# Patient Record
Sex: Female | Born: 1955 | Race: White | Hispanic: No | Marital: Single | Smoking: Never smoker
Health system: Southern US, Community
[De-identification: ages and names within clinical notes are randomized; demographics above are authoritative.]

## PROBLEM LIST (undated history)

## (undated) ENCOUNTER — Inpatient Hospital Stay: Admission: EM | Payer: Self-pay | Source: Home / Self Care

## (undated) DIAGNOSIS — T8859XA Other complications of anesthesia, initial encounter: Secondary | ICD-10-CM

## (undated) DIAGNOSIS — B977 Papillomavirus as the cause of diseases classified elsewhere: Secondary | ICD-10-CM

## (undated) DIAGNOSIS — E785 Hyperlipidemia, unspecified: Secondary | ICD-10-CM

## (undated) DIAGNOSIS — N871 Moderate cervical dysplasia: Secondary | ICD-10-CM

## (undated) DIAGNOSIS — Z87442 Personal history of urinary calculi: Secondary | ICD-10-CM

## (undated) DIAGNOSIS — F32A Depression, unspecified: Secondary | ICD-10-CM

## (undated) DIAGNOSIS — T4145XA Adverse effect of unspecified anesthetic, initial encounter: Secondary | ICD-10-CM

## (undated) DIAGNOSIS — R51 Headache: Secondary | ICD-10-CM

## (undated) DIAGNOSIS — F329 Major depressive disorder, single episode, unspecified: Secondary | ICD-10-CM

## (undated) DIAGNOSIS — I10 Essential (primary) hypertension: Secondary | ICD-10-CM

## (undated) DIAGNOSIS — A630 Anogenital (venereal) warts: Secondary | ICD-10-CM

## (undated) DIAGNOSIS — F419 Anxiety disorder, unspecified: Secondary | ICD-10-CM

## (undated) HISTORY — DX: Papillomavirus as the cause of diseases classified elsewhere: B97.7

## (undated) HISTORY — DX: Depression, unspecified: F32.A

## (undated) HISTORY — DX: Anogenital (venereal) warts: A63.0

## (undated) HISTORY — DX: Anxiety disorder, unspecified: F41.9

## (undated) HISTORY — DX: Essential (primary) hypertension: I10

## (undated) HISTORY — DX: Major depressive disorder, single episode, unspecified: F32.9

## (undated) HISTORY — PX: OTHER SURGICAL HISTORY: SHX169

## (undated) HISTORY — DX: Hyperlipidemia, unspecified: E78.5

## (undated) HISTORY — DX: Moderate cervical dysplasia: N87.1

---

## 2000-07-01 ENCOUNTER — Encounter: Admission: RE | Admit: 2000-07-01 | Discharge: 2000-09-29 | Payer: Self-pay | Admitting: Family Medicine

## 2001-01-11 ENCOUNTER — Encounter: Admission: RE | Admit: 2001-01-11 | Discharge: 2001-04-11 | Payer: Self-pay | Admitting: Family Medicine

## 2001-08-29 ENCOUNTER — Encounter: Admission: RE | Admit: 2001-08-29 | Discharge: 2001-11-27 | Payer: Self-pay | Admitting: Family Medicine

## 2001-11-29 ENCOUNTER — Other Ambulatory Visit: Admission: RE | Admit: 2001-11-29 | Discharge: 2001-11-29 | Payer: Self-pay | Admitting: Gynecology

## 2006-01-13 ENCOUNTER — Encounter: Admission: RE | Admit: 2006-01-13 | Discharge: 2006-01-13 | Payer: Self-pay | Admitting: Internal Medicine

## 2006-04-29 ENCOUNTER — Other Ambulatory Visit: Admission: RE | Admit: 2006-04-29 | Discharge: 2006-04-29 | Payer: Self-pay | Admitting: Gynecology

## 2007-03-21 ENCOUNTER — Other Ambulatory Visit (HOSPITAL_COMMUNITY): Admission: RE | Admit: 2007-03-21 | Discharge: 2007-06-19 | Payer: Self-pay | Admitting: Psychiatry

## 2007-03-22 ENCOUNTER — Ambulatory Visit: Payer: Self-pay | Admitting: Psychiatry

## 2007-05-04 ENCOUNTER — Other Ambulatory Visit: Admission: RE | Admit: 2007-05-04 | Discharge: 2007-05-04 | Payer: Self-pay | Admitting: Gynecology

## 2007-05-17 ENCOUNTER — Encounter: Admission: RE | Admit: 2007-05-17 | Discharge: 2007-08-15 | Payer: Self-pay | Admitting: Internal Medicine

## 2007-06-06 ENCOUNTER — Ambulatory Visit (HOSPITAL_BASED_OUTPATIENT_CLINIC_OR_DEPARTMENT_OTHER): Admission: RE | Admit: 2007-06-06 | Discharge: 2007-06-06 | Payer: Self-pay | Admitting: Gynecology

## 2007-09-26 ENCOUNTER — Other Ambulatory Visit: Admission: RE | Admit: 2007-09-26 | Discharge: 2007-09-26 | Payer: Self-pay | Admitting: Gynecology

## 2008-02-21 ENCOUNTER — Ambulatory Visit: Payer: Self-pay | Admitting: Gynecology

## 2008-02-22 ENCOUNTER — Ambulatory Visit: Payer: Self-pay | Admitting: Gynecology

## 2008-02-29 ENCOUNTER — Ambulatory Visit: Payer: Self-pay | Admitting: Gynecology

## 2008-04-03 ENCOUNTER — Encounter: Admission: RE | Admit: 2008-04-03 | Discharge: 2008-04-03 | Payer: Self-pay | Admitting: Internal Medicine

## 2008-04-30 HISTORY — PX: COLONOSCOPY: SHX174

## 2008-05-07 ENCOUNTER — Other Ambulatory Visit: Admission: RE | Admit: 2008-05-07 | Discharge: 2008-05-07 | Payer: Self-pay | Admitting: Gynecology

## 2008-05-07 ENCOUNTER — Encounter: Payer: Self-pay | Admitting: Gynecology

## 2008-05-07 ENCOUNTER — Ambulatory Visit: Payer: Self-pay | Admitting: Gynecology

## 2008-05-29 ENCOUNTER — Ambulatory Visit: Payer: Self-pay | Admitting: Gynecology

## 2008-06-18 ENCOUNTER — Encounter: Admission: RE | Admit: 2008-06-18 | Discharge: 2008-06-18 | Payer: Self-pay | Admitting: Internal Medicine

## 2008-09-19 ENCOUNTER — Encounter: Payer: Self-pay | Admitting: Gynecology

## 2008-09-19 ENCOUNTER — Other Ambulatory Visit: Admission: RE | Admit: 2008-09-19 | Discharge: 2008-09-19 | Payer: Self-pay | Admitting: Gynecology

## 2008-09-19 ENCOUNTER — Ambulatory Visit: Payer: Self-pay | Admitting: Gynecology

## 2009-05-30 ENCOUNTER — Ambulatory Visit: Payer: Self-pay | Admitting: Gynecology

## 2009-06-21 HISTORY — PX: CHOLECYSTECTOMY: SHX55

## 2010-02-03 ENCOUNTER — Encounter: Admission: RE | Admit: 2010-02-03 | Discharge: 2010-02-03 | Payer: Self-pay | Admitting: Internal Medicine

## 2010-05-21 ENCOUNTER — Encounter: Admission: RE | Admit: 2010-05-21 | Discharge: 2010-05-21 | Payer: Self-pay | Admitting: Internal Medicine

## 2010-05-26 ENCOUNTER — Encounter (INDEPENDENT_AMBULATORY_CARE_PROVIDER_SITE_OTHER): Payer: Self-pay | Admitting: General Surgery

## 2010-06-05 ENCOUNTER — Observation Stay (HOSPITAL_COMMUNITY)
Admission: RE | Admit: 2010-06-05 | Discharge: 2010-06-06 | Payer: Self-pay | Source: Home / Self Care | Attending: General Surgery | Admitting: General Surgery

## 2010-07-12 ENCOUNTER — Encounter: Payer: Self-pay | Admitting: Internal Medicine

## 2010-08-31 LAB — GLUCOSE, CAPILLARY: Glucose-Capillary: 111 mg/dL — ABNORMAL HIGH (ref 70–99)

## 2010-08-31 LAB — COMPREHENSIVE METABOLIC PANEL
ALT: 24 U/L (ref 0–35)
CO2: 31 mEq/L (ref 19–32)
Calcium: 9.3 mg/dL (ref 8.4–10.5)
Chloride: 100 mEq/L (ref 96–112)
Creatinine, Ser: 0.72 mg/dL (ref 0.4–1.2)
GFR calc non Af Amer: >60 mL/min (ref >60)
Glucose, Bld: 98 mg/dL (ref 70–99)
Total Bilirubin: 0.5 mg/dL (ref 0.3–1.2)

## 2010-08-31 LAB — URINALYSIS, ROUTINE W REFLEX MICROSCOPIC
Glucose, UA: NEGATIVE mg/dL
Ketones, ur: NEGATIVE mg/dL
Nitrite: NEGATIVE
Protein, ur: NEGATIVE mg/dL

## 2010-08-31 LAB — CBC
HCT: 41.8 % (ref 36.0–46.0)
Hemoglobin: 13.4 g/dL (ref 12.0–15.0)
MCH: 27.6 pg (ref 26.0–34.0)
MCHC: 32.1 g/dL (ref 30.0–36.0)

## 2010-08-31 LAB — DIFFERENTIAL
Basophils Absolute: 0.1 10*3/uL (ref 0.0–0.1)
Eosinophils Absolute: 0.1 10*3/uL (ref 0.0–0.7)
Lymphocytes Relative: 29 % (ref 12–46)
Lymphs Abs: 2.3 10*3/uL (ref 0.7–4.0)
Neutrophils Relative %: 62 % (ref 43–77)

## 2010-08-31 LAB — SURGICAL PCR SCREEN: Staphylococcus aureus: POSITIVE — AB

## 2010-08-31 LAB — URINE MICROSCOPIC-ADD ON

## 2010-09-22 NOTE — Discharge Summary (Signed)
  NAMETRAVIS, PURK               ACCOUNT NO.:  [id number]  MEDICAL RECORD NO.:  [id number]          PATIENT TYPE:  OBV  LOCATION:  5114                         FACILITY:  [facility]  PHYSICIAN:  Almond Lint, MD       DATE OF BIRTH:  [birth year]  DATE OF ADMISSION:  06/05/2010 DATE OF DISCHARGE:  06/06/2010                              DISCHARGE SUMMARY   PRINCIPAL DIAGNOSIS:  Symptomatic cholelithiasis.  SECONDARY DIAGNOSES:  Diabetes, hypertension, hyperlipidemia, depression, and insomnia.  PRINCIPAL PROCEDURE:  Laparoscopic cholecystectomy June 05, 2010.  PATHOLOGY:  Chronic cholecystitis and cholelithiasis.  DISCHARGE MEDICATIONS: 1. Over-the-counter acid reducer 20 mg once a day. 2. Celexa 10 mg once a day. 3. Wellbutrin XL 300 mg once a day. 4. Actos 30 mg once a day. 5. Ramipril 5 mg once a day. 6. Percocet 5/325 one to two tablets p.o. q.4 h. p.r.n. pain.  LABORATORY DATA:  No labs were obtained as an inpatient.  The patient had preoperative labs including a CBC, which was essentially normal.  UA with small amount of LE, and normal basic metabolic panel.  HOSPITAL COURSE:  The patient was admitted to the hospital for observation following her laparoscopic cholecystectomy due to significant nausea.  The next morning she felt significantly better and wounds were okay.  She was able to ambulate, eat, void without difficulty, and have pain control with oral pain medications.  She was discharged to home in improved condition.  She is instructed not to do any strenuous activity or heavy lifting for 2 weeks.  She should follow up with me in 3-4 weeks.     Almond Lint, MD     FB/MEDQ  D:  07/28/2010  T:  07/29/2010  Job:  [id number]  Electronically Signed by Almond Lint MD on 08/09/2010 12:19:52 AM

## 2010-11-03 NOTE — H&P (Signed)
Patricia Delacruz, Patricia Delacruz               ACCOUNT NO.:  [id number]   MEDICAL RECORD NO.:  [id number]          PATIENT TYPE:  AMB   LOCATION:  [facility]                         FACILITY:  [facility]   PHYSICIAN:  Juan H. Lily Peer, M.D.DATE OF BIRTH:  04-22-1956   DATE OF ADMISSION:  DATE OF DISCHARGE:                              HISTORY & PHYSICAL   The patient is scheduled for surgery on Tuesday, June 06, 2007,  08:45 at [facility].  Please have history and physical  available.   CHIEF COMPLAINT:  1. 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  2. She had vulvar condyloma acuminatum.   HISTORY OF PRESENT ILLNESS:  This is a 55 year old gravida 4, para 3,  and AB 1 who is scheduled to undergo CO2 laser ablation of cervical  dysplasia, as well as vulvar condyloma.  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  On May 04, 2007, it  demonstrated high-grade dysplasia on her Pap.  The colposcopic-directed  biopsy demonstrated that at the 12 o'clock position, there was low-grade  and focal high-grade CIN-I/CIN-II and her ECC was benign.  She had been  treating herself with Aldara for extensive vulvar condyloma, which is  almost 95%-98% resolved with ablating the cervical dysplasia as well as  the remaining fourchette condyloma.   PAST MEDICAL HISTORY:  She is allergic to TETRACYCLINE.  During her  pregnancy, she had gestational diabetes, history of vulvar condyloma,  high-risk HPV, CIN II, diabetes, anxiety, and depression.   MEDICATIONS:  Celexa that she takes 10 mg daily.  She takes also  lorazepam, Actos, Altace, Crestor, and Wellbutrin.   PRIOR SURGERIES:  Consisted of a C-section in 1990.   FAMILY HISTORY:  Father with history of diabetes and mother with history  of lymphoma.   PHYSICAL EXAMINATION:  VITAL SIGNS:  The patient weighs 194 pounds.  Blood pressure is 146/86.  HEENT: Unremarkable.  NECK:  Supple.  Trachea midline.  No carotid bruits or thyromegaly.  LUNGS:  Clear to auscultation without any rhonchi or wheezes.  HEART:  Regular rate and rhythm.  No murmurs or gallop.  BREAST EXAM:  Not done.  ABDOMEN:  Soft and nontender without rebound or guarding.  PELVIC:  As described above.  EXTREMITIES:  No cords.  No edema.   ASSESSMENT:  A 55 year old gravida 4, para 3, and AB 1 with cervical  intraepithelial neoplasia, grade 1; and cervical intraepithelial  neoplasia, grade 2; located at 97 o' clock position of the ectocervix,  also a history of vulvar condyloma, especially near the area of the  fourchette.  The patient is scheduled to undergo CO2 laser ablation of  cervical dysplasia and vulvar condyloma.  The risks, benefits, and pros  and cons of the procedure were discussed.  The patient is a type 2  diabetic for which she currently takes Actos 30 mg daily along with  Altace which she takes 30 mg daily.  She also takes 10 mg of Crestor for  hypercholesterolemia.  The patient was seen for preoperative  consultation and all questions were answered and will follow  accordingly.   PLAN:  As per assessment above.      Juan H. Lily Peer, M.D.  Electronically Signed     JHF/MEDQ  D:  06/05/2007  T:  06/06/2007  Job:  [id number]

## 2010-11-03 NOTE — Op Note (Signed)
[name], Patricia Delacruz               ACCOUNT NO.:  [id number]   MEDICAL RECORD NO.:  [id number]          PATIENT TYPE:  AMB   LOCATION:  [facility]                         FACILITY:  [facility]   PHYSICIAN:  Juan H. Lily Peer, M.D.DATE OF BIRTH:  1955-09-15   DATE OF PROCEDURE:  06/06/2007  DATE OF DISCHARGE:                               OPERATIVE REPORT   SURGEON:  Juan H. Lily Peer, M.D.   INDICATIONS:  A 55 year old gravida 4, para 3, AB1 with CIN-I and CIN-II  of the ectocervix and also vulvar condyloma at the area of the  fourchette.   PREOPERATIVE DIAGNOSES:  1. Cervical intraepithelial neoplasia I and cervical intraepithelial      neoplasia II of the ectocervix.  2. Vulvar condyloma came down to the area of the fourchette.   ANESTHESIA:  General endotracheal anesthesia.   PROCEDURE:  CO2 laser ablation of cervical dysplasia (CIN-I and CIN-II)  at fourchette vulvar condyloma.   DESCRIPTION OF PROCEDURE:  After the patient was adequately counseled,  she was taken to the operating room where she underwent general  endotracheal anesthesia and she was placed in the low lithotomy  position.  The vagina was prepped and draped in the usual sterile  fashion.  A titanium-coated speculum was inserted into the vaginal  vault.  Acetic acid was placed in the vagina and the cervix and a  colposcope was brought into view.  The previous biopsy site at the 12  o'clock position was noted where CIN-I and CIN-II had previously been  biopsied with the CO2 laser set at 6 watts.  A circumferential incision  was made on the ectocervix demarcating the area to be ablated several  millimeters from the CIN-I and CIN-II site that had been biopsied.  In a  brush-like technique, the ectocervix was ablated to a depth of  approximately 3 mm to the level of the transformation zone and Monsel  solution was used for hemostasis.  Of note, no other abnormality was  noted in the remainder of the vagina.  Attention was  then placed to the  external genitalia.  Acetic acid was applied to the external genitalia.  The area of fourchette had some scattered areas of remaining condyloma.  (Of note, the patient had previously been treated herself at home with  Aldara cream).  With the CO2 laser in a continuous mode set at 8 watts,  the area of the fourchette was ablated to a depth of 3 mm.  Silvadene  cream was applied.  Upon completion, the bladder was evacuated off its  content for approximately 600 mL.  She received 1 L of LR.  She received  1 g of cefoxitin for prophylaxis.  She was awakened, extubated, and  transferred to the recovery room with stable vital signs.  Blood loss  from procedure was 1 L and the blood loss was minimal.      Juan H. Lily Peer, M.D.  Electronically Signed     JHF/MEDQ  D:  06/06/2007  T:  06/06/2007  Job:  [id number]

## 2012-01-28 ENCOUNTER — Ambulatory Visit (INDEPENDENT_AMBULATORY_CARE_PROVIDER_SITE_OTHER): Payer: 59 | Admitting: Gynecology

## 2012-01-28 ENCOUNTER — Other Ambulatory Visit (HOSPITAL_COMMUNITY)
Admission: RE | Admit: 2012-01-28 | Discharge: 2012-01-28 | Disposition: A | Discharge status: Home / Self Care | Payer: 59 | Source: Ambulatory Visit | Attending: Gynecology | Admitting: Gynecology

## 2012-01-28 ENCOUNTER — Encounter: Payer: Self-pay | Admitting: Gynecology

## 2012-01-28 VITALS — BP 134/80

## 2012-01-28 DIAGNOSIS — A630 Anogenital (venereal) warts: Secondary | ICD-10-CM

## 2012-01-28 DIAGNOSIS — F419 Anxiety disorder, unspecified: Secondary | ICD-10-CM | POA: Insufficient documentation

## 2012-01-28 DIAGNOSIS — N898 Other specified noninflammatory disorders of vagina: Secondary | ICD-10-CM

## 2012-01-28 DIAGNOSIS — N644 Mastodynia: Secondary | ICD-10-CM

## 2012-01-28 DIAGNOSIS — Z124 Encounter for screening for malignant neoplasm of cervix: Secondary | ICD-10-CM

## 2012-01-28 DIAGNOSIS — F329 Major depressive disorder, single episode, unspecified: Secondary | ICD-10-CM | POA: Insufficient documentation

## 2012-01-28 DIAGNOSIS — F411 Generalized anxiety disorder: Secondary | ICD-10-CM

## 2012-01-28 DIAGNOSIS — Z01419 Encounter for gynecological examination (general) (routine) without abnormal findings: Secondary | ICD-10-CM | POA: Insufficient documentation

## 2012-01-28 DIAGNOSIS — N871 Moderate cervical dysplasia: Secondary | ICD-10-CM | POA: Insufficient documentation

## 2012-01-28 DIAGNOSIS — N950 Postmenopausal bleeding: Secondary | ICD-10-CM

## 2012-01-28 DIAGNOSIS — Z1151 Encounter for screening for human papillomavirus (HPV): Secondary | ICD-10-CM | POA: Insufficient documentation

## 2012-01-28 DIAGNOSIS — F32A Depression, unspecified: Secondary | ICD-10-CM | POA: Insufficient documentation

## 2012-01-28 HISTORY — DX: Anogenital (venereal) warts: A63.0

## 2012-01-28 LAB — CBC WITH DIFFERENTIAL/PLATELET
Hemoglobin: 12.8 g/dL (ref 12.0–15.0)
Lymphocytes Relative: 30 % (ref 12–46)
Lymphs Abs: 1.6 10*3/uL (ref 0.7–4.0)
Monocytes Relative: 7 % (ref 3–12)
Neutro Abs: 3.2 10*3/uL (ref 1.7–7.7)
Neutrophils Relative %: 61 % (ref 43–77)
Platelets: 190 10*3/uL (ref 150–400)
RBC: 4.6 MIL/uL (ref 3.87–5.11)
WBC: 5.2 10*3/uL (ref 4.0–10.5)

## 2012-01-28 MED ORDER — MEGESTROL ACETATE 40 MG PO TABS: 40.0000 mg | ORAL_TABLET | Freq: Two times a day (BID) | ORAL | Status: DC

## 2012-01-28 NOTE — Addendum Note (Signed)
Addended by: Ok Edwards on: 01/28/2012 04:50 PM   Modules accepted: Orders

## 2012-01-28 NOTE — Progress Notes (Signed)
November 2011. She stated in June 11 she bled for 5 days heavy stopped and started bleeding last night again. Patient was also complaining of some breast tenderness for the past 5 months on and off. Her last mammogram was in August of 2011. Her last Pap smear was in 2010. 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 Last Pap smear on record indicated atypical squamous cells of undetermined significance with high risk HPV and patient did not followup. After 2010. Patient stated her last colonoscopy was in 2010 and her last mammogram was in 2011. Patient is on no hormone replacement therapy.  Exam: Breast exam: Both breasts symmetrical in appearance no skin discoloration or nipple inversion on palpable masses or tenderness no supraclavicular axillary lymphadenopathy  Abdomen: Soft nontender no rebound guarding  Pelvic exam:  Physical Exam  Genitourinary:     Bimanual exam: Limited due to patient's abdominal girth. The cervix was cleansed with Betadine solution and an endometrial biopsy was done with a Pipelle. A moderate amount of tissue was obtained and submitted for histological evaluation. Prior to this a Pap smear was done since she was long overdue. We did do a vaginal fornix biopsy left) after 1% lidocaine was infiltrated at the base for 3 or 4 cc in the tissue submitted for histological evaluation. Patient will return to the office next week for sonohysterogram for better assessment of endometrial cavity and her adnexa as well. In the meantime she will be placed on Megace 40 mg twice a day for 10 days. We'll check a CBC, TSH and prolactin level today. 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

## 2012-01-28 NOTE — Patient Instructions (Addendum)
Remember to schedule your mammogram  Take one vitamin E 600 units daily to help with breast tenderness and cut down on the caffeine  Prescription for Megace in pharmacy to pick up

## 2012-01-29 LAB — PROLACTIN: Prolactin: 9.8 ng/mL

## 2012-01-29 LAB — TSH: TSH: 2.342 u[IU]/mL (ref 0.350–4.500)

## 2012-01-31 ENCOUNTER — Other Ambulatory Visit: Payer: Self-pay | Admitting: Gynecology

## 2012-01-31 DIAGNOSIS — Z1231 Encounter for screening mammogram for malignant neoplasm of breast: Secondary | ICD-10-CM

## 2012-02-02 ENCOUNTER — Other Ambulatory Visit: Payer: Self-pay | Admitting: Gynecology

## 2012-02-02 ENCOUNTER — Ambulatory Visit (INDEPENDENT_AMBULATORY_CARE_PROVIDER_SITE_OTHER): Payer: 59

## 2012-02-02 ENCOUNTER — Ambulatory Visit (INDEPENDENT_AMBULATORY_CARE_PROVIDER_SITE_OTHER): Payer: 59 | Admitting: Gynecology

## 2012-02-02 DIAGNOSIS — N840 Polyp of corpus uteri: Secondary | ICD-10-CM

## 2012-02-02 DIAGNOSIS — N950 Postmenopausal bleeding: Secondary | ICD-10-CM | POA: Insufficient documentation

## 2012-02-02 DIAGNOSIS — N839 Noninflammatory disorder of ovary, fallopian tube and broad ligament, unspecified: Secondary | ICD-10-CM

## 2012-02-02 DIAGNOSIS — D259 Leiomyoma of uterus, unspecified: Secondary | ICD-10-CM | POA: Insufficient documentation

## 2012-02-02 DIAGNOSIS — N83339 Acquired atrophy of ovary and fallopian tube, unspecified side: Secondary | ICD-10-CM

## 2012-02-02 DIAGNOSIS — D3910 Neoplasm of uncertain behavior of unspecified ovary: Secondary | ICD-10-CM

## 2012-02-02 DIAGNOSIS — N83209 Unspecified ovarian cyst, unspecified side: Secondary | ICD-10-CM

## 2012-02-02 HISTORY — DX: Leiomyoma of uterus, unspecified: D25.9

## 2012-02-02 LAB — CA 125: CA 125: 13.8 U/mL (ref 0.0–30.2)

## 2012-02-02 NOTE — Progress Notes (Signed)
Patient presented to the office today for sonohysterogram as a result of her postmenopausal bleeding. She was seen the office on August 9. She stated that she did not had a menstrual period since November 2011. She then re\re bled on June 11 for 5 days heavy stopped and restarted again prior to her office visit of August 9. She also been complaining of breast tenderness for 5 months on and off.Her last mammogram was in August of 2011. Her last Pap smear was in 2010. 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 Last Pap smear on record indicated atypical squamous cells of undetermined significance with high risk HPV and patient did not followup. A Pap smear was done on recent office visit result pending at time of this dictation she also underwent a colposcopic directed biopsy and it was noted she had 2 raised areas on the left vaginal fornix which were biopsied as well as endometrial biopsy. The only biopsy received thus far is the endometrial biopsy with the following:  Diagnosis 1. Endometrium, biopsy - DEGENERATING SECRETORY-TYPE ENDOMETRIUM. - BENIGN ENDOCERVICAL TYPE MUCOSA. - THERE IS NO EVIDENCE OF HYPERPLASIA OR MALIGNANCY - SEE COMMENT. 2. Vagina, biopsy - BENIGN SQUAMOUS LINED MUCOSA WITH MINIMAL INFLAMMATION. - THERE IS NO EVIDENCE OF MALIGNANCY  Patient had been given Megace 40 mg twice a day to stop her bleeding. Recent blood work results as follows:  Results for KOREN, PLYLER (MRN [id number]) as of 02/02/2012 13:56  Ref. Range 01/28/2012 16:02  WBC Latest Range: 4.0-10.5 K/uL 5.2  RBC Latest Range: 3.87-5.11 MIL/uL 4.60  Hemoglobin Latest Range: 12.0-15.0 g/dL 40.9  HCT Latest Range: 36.0-46.0 % 38.5  MCV Latest Range: 78.0-100.0 fL 83.7  MCH Latest Range: 26.0-34.0 pg 27.8  MCHC Latest Range: 30.0-36.0 g/dL 81.1  RDW Latest Range: 11.5-15.5 % 14.8  Platelets Latest Range: 150-400 K/uL 190  Neutrophils Relative Latest Range: 43-77 % 61    Lymphocytes Relative Latest Range: 12-46 % 30  Monocytes Relative Latest Range: 3-12 % 7  Eosinophils Relative Latest Range: 0-5 % 1  Basophils Relative Latest Range: 0-1 % 1  NEUT# Latest Range: 1.7-7.7 K/uL 3.2  Lymphocytes Absolute Latest Range: 0.7-4.0 K/uL 1.6  Monocytes Absolute Latest Range: 0.1-1.0 K/uL 0.3  Eosinophils Absolute Latest Range: 0.0-0.7 K/uL 0.1  Basophils Absolute Latest Range: 0.0-0.1 K/uL 0.1  Smear Review No range found Criteria for review not met  Prolactin No range found 9.8  TSH Latest Range: 0.350-4.500 uIU/mL 2.342    Ultrasound today: Uterus measures 7.0 x 4.7 x 4.4 cm with endometrial stripe of 6.2 mm. Prominent endometrial cavity echogenic was noted. Right ovary had a thin smooth walled solid mass measuring 29 x 26 x 29 mm arterial blood flow was seen in the ovarian tissue mass negative for color flow. Left ovary was atrophic no fluid in the cul-de-sac. Sonohysterogram posterior uterine defect left uterine wall measuring 6 x 11 mm and fundal calcified area measuring 13 x 16 mm.  Assessment/plan: Patient with postmenopausal bleeding benign endometrial biopsy questionable endometrial polyps versus lesion in the fundus of the uterus. Also solid right ovarian mass. A CA 125 ovarian tumor marker will be ordered today. We are going to order an MRI of the abdomen and pelvis to better delineate this fundal lesion and the ovarian mass as well as for any signs of lymphadenopathy. We will also order a chest x-ray PA and lateral. We'll sit down and talk with the patient after the results are completed  and plan a course of management which may include referral to a GYN oncologist in the event of highly suspicious ovarian cancer and/or endometrial cancer.

## 2012-02-02 NOTE — Patient Instructions (Addendum)
Total Laparoscopic Hysterectomy A total laparoscopic hysterectomy is a minimally invasive surgery to remove your uterus and cervix. This surgery is performed by making several small cuts (incisions) in your abdomen. It can also be done with a thin, lighted tube (laparoscope) inserted into 2 small incisions in the lower abdomen. Your fallopian tubes and ovaries can be removed (bilateral salpingo-oopherectomy) during this surgery as well.If a total laparoscopic hysterectomy is started and it is not safe to continue, the laparoscopic surgery will be converted to an open abdominal surgery. You will not have menstrual periods or be able to get pregnant after having this surgery. If a bilateral salpingo-oopherectomy was performed before menopause, you will go through a sudden (abrupt) menopause. This can be helped with hormone medicines. Benefits of minimally invasive surgery include:  Less pain.   Less risk of blood loss.   Less risk of infection.   Quicker return to normal activities.   Usually a 1 night stay in the hospital.   Overall patient satisfaction.  LET YOUR CAREGIVER KNOW ABOUT:  Any history of abnormal Pap tests.   Allergies to food or medicine.   Medicines taken, including vitamins, herbs, eyedrops, over-the-counter medicines, and creams.   Use of steroids (by mouth or creams).   Previous problems with anesthetics or numbing medicines.   History of bleeding problems or blood clots.   Previous surgery.   Other health problems, including diabetes and kidney problems.   Desire for future fertility.   Any infections or colds you may have developed.   Symptoms of irregular or heavy periods, weight loss, or urinary or bowel changes.  RISKS AND COMPLICATIONS   Bleeding.   Blood clots in the legs or lung.   Infection.   Injury to surrounding organs.   Problems with anesthesia.   Early menopause symptoms (hot flashes, night sweats, insomnia).   Risk of conversion  to an open abdominal incision.  BEFORE THE PROCEDURE  Ask your caregiver about changing or stopping your regular medicines.   Do not take aspirin or blood thinners (anticoagulants) for 1 week before the surgery, or as told by your caregiver.   Do not eat or drink anything for 8 hours before the surgery, or as told by your caregiver.   Quit smoking if you smoke.   Arrange for a ride home after surgery and for someone to help you at home during recovery.  PROCEDURE   You will be given antibiotic medicine.   An intravenous (IV) line will be placed in your arm. You will be given medicine to make you sleep (general anesthetic).   A gas (carbon dioxide) will be used to inflate your abdomen. This will allow your surgeon to look inside your abdomen, perform your surgery, and treat any other problems found if necessary.   Three or four small incisions (often less than  inch) will be made in your abdomen. One of these incisions will be made in the area of your belly button (navel). The laparoscope will be inserted into the incision. Your surgeon will look through the laparoscope while doing your procedure.   Other surgical instruments will be inserted through the other incisions.   The uterus may be removed through the vagina or cut into small pieces and removed through the small incisions.   Your incisions will be closed.  AFTER THE PROCEDURE  The gas will be released from inside your abdomen.   You will be taken to the recovery area where a nurse will watch and   check your progress. Once you are awake, stable, and taking fluids well, without other problems, you will return to your room or be allowed to go home.   There is usually minimal discomfort following the surgery because the incisions are so small.   You will be given pain medicine while you are in the hospital and for when you go home.   Try to have someone with you the first 3 to 5 days after you go home.   Follow up with  your surgeon in 2 to 4 weeks after surgery to evaluate your progress.  Document Released: 04/04/2007 Document Revised: 05/27/2011 Document Reviewed: 01/22/2011 [facility] Patient Information 2012 [place], [place].  Ovarian Cyst The ovaries are small organs that are on each side of the uterus. The ovaries are the organs that produce the female hormones, estrogen and progesterone. An ovarian cyst is a sac filled with fluid that can vary in its size. It is normal for a small cyst to form in women who are in the childbearing age and who have menstrual periods. This type of cyst is called a follicle cyst that becomes an ovulation cyst (corpus luteum cyst) after it produces the women's egg. It later goes away on its own if the woman does not become pregnant. There are other kinds of ovarian cysts that may cause problems and may need to be treated. The most serious problem is a cyst with cancer. It should be noted that menopausal women who have an ovarian cyst are at a higher risk of it being a cancer cyst. They should be evaluated very quickly, thoroughly and followed closely. This is especially true in menopausal women because of the high rate of ovarian cancer in women in menopause. CAUSES AND TYPES OF OVARIAN CYSTS:  FUNCTIONAL CYST: The follicle/corpus luteum cyst is a functional cyst that occurs every month during ovulation with the menstrual cycle. They go away with the next menstrual cycle if the woman does not get pregnant. Usually, there are no symptoms with a functional cyst.   ENDOMETRIOMA CYST: This cyst develops from the lining of the uterus tissue. This cyst gets in or on the ovary. It grows every month from the bleeding during the menstrual period. It is also called a "chocolate cyst" because it becomes filled with blood that turns brown. This cyst can cause pain in the lower abdomen during intercourse and with your menstrual period.   CYSTADENOMA CYST: This cyst develops from the cells on the  outside of the ovary. They usually are not cancerous. They can get very big and cause lower abdomen pain and pain with intercourse. This type of cyst can twist on itself, cut off its blood supply and cause severe pain. It also can easily rupture and cause a lot of pain.   DERMOID CYST: This type of cyst is sometimes found in both ovaries. They are found to have different kinds of body tissue in the cyst. The tissue includes skin, teeth, hair, and/or cartilage. They usually do not have symptoms unless they get very big. Dermoid cysts are rarely cancerous.   POLYCYSTIC OVARY: This is a rare condition with hormone problems that produces many small cysts on both ovaries. The cysts are follicle-like cysts that never produce an egg and become a corpus luteum. It can cause an increase in body weight, infertility, acne, increase in body and facial hair and lack of menstrual periods or rare menstrual periods. Many women with this problem develop type 2 diabetes. The exact cause  of this problem is unknown. A polycystic ovary is rarely cancerous.   THECA LUTEIN CYST: Occurs when too much hormone (human chorionic gonadotropin) is produced and over-stimulates the ovaries to produce an egg. They are frequently seen when doctors stimulate the ovaries for invitro-fertilization (test tube babies).   LUTEOMA CYST: This cyst is seen during pregnancy. Rarely it can cause an obstruction to the birth canal during labor and delivery. They usually go away after delivery.  SYMPTOMS   Pelvic pain or pressure.   Pain during sexual intercourse.   Increasing girth (swelling) of the abdomen.   Abnormal menstrual periods.   Increasing pain with menstrual periods.   You stop having menstrual periods and you are not pregnant.  DIAGNOSIS  The diagnosis can be made during:  Routine or annual pelvic examination (common).   Ultrasound.   X-ray of the pelvis.   CT Scan.   MRI.   Blood tests.  TREATMENT   Treatment  may only be to follow the cyst monthly for 2 to 3 months with your caregiver. Many go away on their own, especially functional cysts.   May be aspirated (drained) with a long needle with ultrasound, or by laparoscopy (inserting a tube into the pelvis through a small incision).   The whole cyst can be removed by laparoscopy.   Sometimes the cyst may need to be removed through an incision in the lower abdomen.   Hormone treatment is sometimes used to help dissolve certain cysts.   Birth control pills are sometimes used to help dissolve certain cysts.  HOME CARE INSTRUCTIONS  Follow your caregiver's advice regarding:  Medicine.   Follow up visits to evaluate and treat the cyst.   You may need to come back or make an appointment with another caregiver, to find the exact cause of your cyst, if your caregiver is not a gynecologist.   Get your yearly and recommended pelvic examinations and Pap tests.   Let your caregiver know if you have had an ovarian cyst in the past.  SEEK MEDICAL CARE IF:   Your periods are late, irregular, they stop, or are painful.   Your stomach (abdomen) or pelvic pain does not go away.   Your stomach becomes larger or swollen.   You have pressure on your bladder or trouble emptying your bladder completely.   You have painful sexual intercourse.   You have feelings of fullness, pressure, or discomfort in your stomach.   You lose weight for no apparent reason.   You feel generally ill.   You become constipated.   You lose your appetite.   You develop acne.   You have an increase in body and facial hair.   You are gaining weight, without changing your exercise and eating habits.   You think you are pregnant.  SEEK IMMEDIATE MEDICAL CARE IF:   You have increasing abdominal pain.   You feel sick to your stomach (nausea) and/or vomit.   You develop a fever that comes on suddenly.   You develop abdominal pain during a bowel movement.   Your  menstrual periods become heavier than usual.  Document Released: 06/07/2005 Document Revised: 05/27/2011 Document Reviewed: 04/10/2009 [facility] Patient Information 2012 [place], [place].

## 2012-02-03 ENCOUNTER — Telehealth: Payer: Self-pay | Admitting: *Deleted

## 2012-02-03 DIAGNOSIS — N838 Other noninflammatory disorders of ovary, fallopian tube and broad ligament: Secondary | ICD-10-CM

## 2012-02-03 NOTE — Telephone Encounter (Signed)
Message copied by Aura Camps on Thu Feb 03, 2012 10:20 AM ------      Message from: Mckinley Jewel, AMY L      Created: Wed Feb 02, 2012  2:49 PM                   ----- Message -----         From: Ok Edwards, MD         Sent: 02/02/2012   2:01 PM           To: Amy Duwaine Maxin, CNA            Amy, please order an MRI of the abdomen and pelvis with and without contrast for this patient with uterine and ovarian mass. She will also need a chest x-ray PA and lateral. She will need to make an appointment to see me a week after the above tests. Thank you

## 2012-02-03 NOTE — Telephone Encounter (Signed)
Pt informed with test on 02/07/12 @ 10:45 am. @ [facility]

## 2012-02-04 ENCOUNTER — Encounter: Payer: Self-pay | Admitting: Gynecology

## 2012-02-04 ENCOUNTER — Other Ambulatory Visit: Payer: Self-pay | Admitting: Gynecology

## 2012-02-04 DIAGNOSIS — N838 Other noninflammatory disorders of ovary, fallopian tube and broad ligament: Secondary | ICD-10-CM

## 2012-02-04 NOTE — Progress Notes (Signed)
I spoke with Rosetta Posner at [facility] regarding MRI of pelvis with and without contrast scheduled for Monday, August 19 at [facility].  Notification [id number]. Good for 45 days and expires on 03/20/12.  [facility] informed.

## 2012-02-04 NOTE — Addendum Note (Signed)
Addended by: Aura Camps on: 02/04/2012 09:49 AM   Modules accepted: Orders

## 2012-02-04 NOTE — Telephone Encounter (Addendum)
New order placed for MRI pelvis order, wrong order was placed. Spoke with Elnita Maxwell at [facility] to cancel [facility] order. Appointment time same as the below. Per Dr. Lily Peer staff message pt will only need pelvis MRI no need for abdomen.

## 2012-02-07 ENCOUNTER — Ambulatory Visit (HOSPITAL_COMMUNITY)
Admission: RE | Admit: 2012-02-07 | Discharge: 2012-02-07 | Disposition: A | Discharge status: Home / Self Care | Payer: 59 | Source: Ambulatory Visit | Attending: Gynecology | Admitting: Gynecology

## 2012-02-07 ENCOUNTER — Other Ambulatory Visit: Payer: Self-pay | Admitting: *Deleted

## 2012-02-07 ENCOUNTER — Inpatient Hospital Stay (HOSPITAL_COMMUNITY): Admission: RE | Admit: 2012-02-07 | Payer: 59 | Source: Ambulatory Visit

## 2012-02-07 DIAGNOSIS — N838 Other noninflammatory disorders of ovary, fallopian tube and broad ligament: Secondary | ICD-10-CM

## 2012-02-07 DIAGNOSIS — E119 Type 2 diabetes mellitus without complications: Secondary | ICD-10-CM | POA: Insufficient documentation

## 2012-02-07 DIAGNOSIS — Z01818 Encounter for other preprocedural examination: Secondary | ICD-10-CM | POA: Insufficient documentation

## 2012-02-07 DIAGNOSIS — N839 Noninflammatory disorder of ovary, fallopian tube and broad ligament, unspecified: Secondary | ICD-10-CM | POA: Insufficient documentation

## 2012-02-07 DIAGNOSIS — Z01812 Encounter for preprocedural laboratory examination: Secondary | ICD-10-CM | POA: Insufficient documentation

## 2012-02-07 DIAGNOSIS — I10 Essential (primary) hypertension: Secondary | ICD-10-CM

## 2012-02-07 LAB — CREATININE, SERUM
Creatinine, Ser: 0.66 mg/dL (ref 0.50–1.10)
GFR calc Af Amer: >90 mL/min (ref >90)
GFR calc non Af Amer: >90 mL/min (ref >90)

## 2012-02-08 ENCOUNTER — Telehealth: Payer: Self-pay | Admitting: *Deleted

## 2012-02-08 NOTE — Telephone Encounter (Signed)
Please inform patient that we do need to do the MRI for better visualization. Please contact [facility] and tell them that we need to schedule a pelvic MRI with contrast sedation for this patient. They can schedule for it to be to be done and patient be sedated and be more relaxed during the procedure. Please inform patient also that her chest x-ray was normal.

## 2012-02-08 NOTE — Telephone Encounter (Signed)
Pt was scheduled to have MRI pelvis and chest X-ray PA & Lateral, pt had x -ray done and would like the results. Pt couldn't handle the MRI she said the noise was too loud and her nervous were very bad that day. Pt asked if something should be done since she couldn't handle the MRI? Please advise

## 2012-02-09 NOTE — Telephone Encounter (Signed)
Pt can come Friday 02/11/12 @ 12:00 pm, [facility] said that you will need to prescribe pt oral medication to bring in with her to have MRI done, pt must arrive 1 hour ahead of scheduled time. Please advise

## 2012-02-09 NOTE — Telephone Encounter (Signed)
Left message for pt to call.

## 2012-02-09 NOTE — Telephone Encounter (Signed)
Please: Prescription for Valium 10 mg she can take 1 by mouth the night before the procedure and take 1 tablet with her for the procedure. Please call in for 2 Valium

## 2012-02-10 NOTE — Telephone Encounter (Signed)
Order faxed to [facility] [phone] (office) [phone] (fax) copy insurance card sent as well with prior auth. valid 02/04/12-02/22/12.

## 2012-02-10 NOTE — Telephone Encounter (Signed)
Spoke with pt regarding the below and her daughter found a place [facility] [place] [facility]where she can have an Ambulance person (open) MRI. Pt would like to cancel MRI scheduled on 02/11/12 at [facility] and have her MRI of pelvis done there.

## 2012-02-10 NOTE — Telephone Encounter (Signed)
That's fine do we need to give than any specific order and how are we to receive a copy of the report?

## 2012-02-11 ENCOUNTER — Telehealth: Payer: Self-pay | Admitting: *Deleted

## 2012-02-11 ENCOUNTER — Other Ambulatory Visit (HOSPITAL_COMMUNITY): Payer: 59

## 2012-02-11 MED ORDER — DIAZEPAM 5 MG PO TABS: 5.0000 mg | ORAL_TABLET | Freq: Two times a day (BID) | ORAL | Status: AC | PRN

## 2012-02-11 NOTE — Telephone Encounter (Signed)
Patricia Delacruz I think from her previous notified not mistaken we can prescribe Valium 5 mg which she can take the night before the procedure so she can sleep. Then she can take 1 the morning of the procedure and then after the procedure. Make sure that someone is driving her to get the study done. So please call in Valium 5 mg 1 by mouth twice a day when necessary #10. If a written prescription is needed let me know so I will fill out one for her and she can come by and pick it up or we can fax it to the pharmacy.

## 2012-02-11 NOTE — Telephone Encounter (Signed)
Pt informed with the below note, rx called in 

## 2012-02-11 NOTE — Telephone Encounter (Signed)
Follow up for telephone encounter 02/08/12 pt has MRI scheduled for 02/14/12 @ 4:00 pm at [facility] [place]. She asked if you could still prescribe the valium? Pt said she is very nervous about the MRI still. Please advise

## 2012-02-17 ENCOUNTER — Ambulatory Visit
Admission: RE | Admit: 2012-02-17 | Discharge: 2012-02-17 | Disposition: A | Discharge status: Home / Self Care | Payer: 59 | Source: Ambulatory Visit | Attending: Gynecology | Admitting: Gynecology

## 2012-02-17 DIAGNOSIS — Z1231 Encounter for screening mammogram for malignant neoplasm of breast: Secondary | ICD-10-CM

## 2012-02-22 ENCOUNTER — Encounter: Payer: Self-pay | Admitting: Gynecology

## 2012-02-22 ENCOUNTER — Ambulatory Visit (INDEPENDENT_AMBULATORY_CARE_PROVIDER_SITE_OTHER): Payer: 59 | Admitting: Gynecology

## 2012-02-22 VITALS — BP 130/86

## 2012-02-22 DIAGNOSIS — N950 Postmenopausal bleeding: Secondary | ICD-10-CM

## 2012-02-22 DIAGNOSIS — N840 Polyp of corpus uteri: Secondary | ICD-10-CM

## 2012-02-22 DIAGNOSIS — R1900 Intra-abdominal and pelvic swelling, mass and lump, unspecified site: Secondary | ICD-10-CM

## 2012-02-22 DIAGNOSIS — Z01818 Encounter for other preprocedural examination: Secondary | ICD-10-CM

## 2012-02-22 MED ORDER — MEGESTROL ACETATE 40 MG PO TABS: 40.0000 mg | ORAL_TABLET | Freq: Two times a day (BID) | ORAL | Status: DC

## 2012-02-22 NOTE — Progress Notes (Addendum)
Patricia Delacruz is an 56 y.o. female. Who presented to the office today for preoperative consultation as a result of her postmenopausal bleeding and ovarian cyst. 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 Last Pap smear on record indicated atypical squamous cells of undetermined significance with high risk HPV and patient did not followup.Patient stated her last colonoscopy was in 2010 and was normal. Her Pap smear on August 9 demonstrated the following:  NEGATIVE FOR INTRAEPITHELIAL LESIONS OR MALIGNANCY. ENDOMETRIAL CELLS PRESENT IN A POSTMENOPAUSAL WOMAN. CORRELATION WITH CLINICAL HISTORY AND/OR TISSUE STUDIES ARE RECOMMENDED AS CLINICALLY INDICATED.  Her endometrial biopsy demonstrated the following:  1. Endometrium, biopsy - DEGENERATING SECRETORY-TYPE ENDOMETRIUM. - BENIGN ENDOCERVICAL TYPE MUCOSA. - THERE IS NO EVIDENCE OF HYPERPLASIA OR MALIGNANCY - SEE COMMENT. 2. Vagina, biopsy - BENIGN SQUAMOUS LINED MUCOSA WITH MINIMAL INFLAMMATION. - THERE IS NO EVIDENCE OF MALIGNANCY. Microscopic Comment 1. This pattern can be associated with menses, irregular shedding or breakthrough bleeding associated with hormonal therapy  Her ultrasound/sono hysterogram on August 14 demonstrated the following:  Uterus measures 7.0 x 4.7 x 4.4 cm with endometrial stripe of 6.2 mm. Prominent endometrial cavity echogenic was noted. Right ovary had a thin smooth walled solid mass measuring 29 x 26 x 29 mm arterial blood flow was seen in the ovarian tissue mass negative for color flow. Left ovary was atrophic no fluid in the cul-de-sac. Sonohysterogram posterior uterine defect left uterine wall measuring 6 x 11 mm and fundal calcified area measuring 13 x 16 mm.  Her chest x-ray PA and lateral was  normal  MRI of the pelvis with and without contrast results as follows:  Complex cystic mass within the right adnexa measuring approximately 2.6 x 2.2 x 2.65 cm for which a hemorrhagic cyst is favored. Heterogeneity of the uterine myometrium for which leiomyomatous change is favored   Patient been given Megace 40 mg to take 1 by mouth twice a day for 10 days to stop her bleeding. Her recent CBC, TSH, and prolactin was normal. Her CA 125 was normal at 13.8. She's been followed by Dr. Denice Paradise for type 2 diabetes.  Pertinent Gynecological History: Menses: post-menopausal Bleeding: post menopausal bleeding Contraception: none DES exposure: denies Blood transfusions: none Sexually transmitted diseases: Condyloma acuminatum Previous GYN Procedures: CO2 laser ablation of CIN-1 and CIN-2 and condyloma acuminatum  Last mammogram: abnormal: Dense breasts additional views requested pending results Date: 2013 Last pap: See above Date: 2013 OB History: G 4, P 3 A1   Menstrual History: Menarche age: 28 Patient's last menstrual period was 05/18/2010.    Past Medical History  Diagnosis Date  . Diabetes mellitus     GESTATIONAL  . Condyloma acuminata     VULVAR  . High risk HPV infection   . CIN II (cervical intraepithelial neoplasia II)   . Anxiety   . Depression   . Hypertension     Past Surgical History  Procedure Date  . Cesarean section 1990  . Co 2 laser      CERVIX /VAGINA  . Colonoscopy NOV/03/2008    NEXT IN 10 YEARS    Family History  Problem Relation Age of Onset  . Cancer Mother     LYMPHOMA  . Diabetes Father     Social History:  reports that she has never  smoked. She has never used smokeless tobacco. She reports that she drinks alcohol. She reports that she does not use illicit drugs.  Allergies:  Allergies  Allergen Reactions  . Tetracyclines & Related      (Not in a hospital admission)  REVIEW OF SYSTEMS: A ROS was performed and pertinent positives  and negatives are included in the history.  GENERAL: No fevers or chills. HEENT: No change in vision, no earache, sore throat or sinus congestion. NECK: No pain or stiffness. CARDIOVASCULAR: No chest pain or pressure. No palpitations. PULMONARY: No shortness of breath, cough or wheeze. GASTROINTESTINAL: No abdominal pain, nausea, vomiting or diarrhea, melena or bright red blood per rectum. GENITOURINARY: No urinary frequency, urgency, hesitancy or dysuria. MUSCULOSKELETAL: No joint or muscle pain, no back pain, no recent trauma. DERMATOLOGIC: No rash, no itching, no lesions. ENDOCRINE: No polyuria, polydipsia, no heat or cold intolerance. No recent change in weight. HEMATOLOGICAL: No anemia or easy bruising or bleeding. NEUROLOGIC: No headache, seizures, numbness, tingling or weakness. PSYCHIATRIC: No depression, no loss of interest in normal activity or change in sleep pattern.     Blood pressure 130/86, last menstrual period 05/18/2010.  Physical Exam:  HEENT:unremarkable Neck:Supple, midline, no thyroid megaly, no carotid bruits Lungs:  Clear to auscultation no rhonchi's or wheezes Heart:Regular rate and rhythm, no murmurs or gallops Breast Exam: Not examined Abdomen: Soft nontender no rebound or guarding  Pelvic:BUS within normal limits Vagina: No lesions or discharge except the previous left vaginal fornix raised area that were biopsy which was benign Cervix: No gross lesions on inspection Uterus: Anteverted upper limits of normal Adnexa: Right adnexal fullness no rebound guarding Extremities: No cords, no edema Rectal: Not done  No results found for this or any previous visit (from the past 24 hour(s)).  No results found.  Assessment/Plan: Patient with postmenopausal bleeding along with endometrial thickness of 6.2 mm with no evidence of endometrial hyperplasia on office endometrial biopsy. Sonohysterogram with posterior uterine defect left uterine wall measuring 6 x 11 mm and  fundal calcified area measuring 13 x 16 mm requires further evaluation. Also,Right ovary had a thin smooth walled solid mass measuring 29 x 26 x 29 mm arterial blood flow was seen in the ovarian tissue mass negative for color flow. Although she has a normal CA 125 and being postmenopausal requires further evaluation. We had a detailed discussion today of the findings and alternative options for further evaluation to include the following: Proceed with a total laparoscopic hysterectomy and bilateral salpingo-oophorectomy or laparoscopic bilateral salpingo-oophorectomy along with diagnostic hysteroscopy with a vigorous D&C and await pathology report and refer to GYN oncologist if malignant. Patient would like to proceed with the latter. The following risk were discussed:                        Patient was counseled as to the risk of surgery to include the following:  1. Infection (prohylactic antibiotics will be administered)  2. DVT/Pulmonary Embolism (prophylactic pneumo compression stockings will be used)  3.Trauma to internal organs requiring additional surgical procedure to repair any injury to     Internal organs requiring perhaps additional hospitalization days.  4.Hemmorhage requiring transfusion and blood products which carry risks such as anaphylactic reaction, hepatitis and AIDS  Patient had received literature information on the procedure scheduled and all her questions were answered and accepts all risk.  [facility] HMD5:40 PMTD@    Ok Edwards 02/22/2012, 5:21 PM

## 2012-02-22 NOTE — Patient Instructions (Addendum)
Hysteroscopy Hysteroscopy is a procedure used for looking inside the womb (uterus). It may be done for many different reasons, including:  To evaluate abnormal bleeding, fibroid (benign, noncancerous) tumors, polyps, scar tissue (adhesions), and possibly cancer of the uterus.   To look for lumps (tumors) and other uterine growths.   To look for causes of why a woman cannot get pregnant (infertility), causes of recurrent loss of pregnancy (miscarriages), or a lost intrauterine device (IUD).   To perform a sterilization by blocking the fallopian tubes from inside the uterus.  A hysteroscopy should be done right after a menstrual period to be sure you are not pregnant. LET YOUR CAREGIVER KNOW ABOUT:   Allergies.   Medicines taken, including herbs, eyedrops, over-the-counter medicines, and creams.   Use of steroids (by mouth or creams).   Previous problems with anesthetics or numbing medicines.   History of bleeding or blood problems.   History of blood clots.   Possibility of pregnancy, if this applies.   Previous surgery.   Other health problems.  RISKS AND COMPLICATIONS   Putting a hole in the uterus.   Excessive bleeding.   Infection.   Damage to the cervix.   Injury to other organs.   Allergic reaction to medicines.   Too much fluid used in the uterus for the procedure.  BEFORE THE PROCEDURE   Do not take aspirin or blood thinners for a week before the procedure, or as directed. It can cause bleeding.   Arrive at least 60 minutes before the procedure or as directed to read and sign the necessary forms.   Arrange for someone to take you home after the procedure.   If you smoke, do not smoke for 2 weeks before the procedure.  PROCEDURE   Your caregiver may give you medicine to relax you. He or she may also give you a medicine that numbs the area around the cervix (local anesthetic) or a medicine that makes you sleep (general anesthesia).   Sometimes, a  medicine is placed in the cervix the day before the procedure. This medicine makes the cervix have a larger opening (dilate). This makes it easier for the instrument to be inserted into the uterus.   A small instrument (hysteroscope) is inserted through the vagina into the uterus. This instrument is similar to a pencil-sized telescope with a light.   During the procedure, air or a liquid is put into the uterus, which allows the surgeon to see better.   Sometimes, tissue is gently scraped from inside the uterus. These tissue samples are sent to a specialist who looks at tissue samples (pathologist). The pathologist will give a report to your caregiver. This will help your caregiver decide if further treatment is necessary. The report will also help your caregiver decide on the best treatment if the test comes back abnormal.  AFTER THE PROCEDURE   If you had a general anesthetic, you may be groggy for a couple hours after the procedure.   If you had a local anesthetic, you will be advised to rest at the surgical center or caregiver's office until you are stable and feel ready to go home.   You may have some cramping for a couple days.   You may have bleeding, which varies from light spotting for a few days to menstrual-like bleeding for up to 3 to 7 days. This is normal.   Have someone take you home.  FINDING OUT THE RESULTS OF YOUR TEST Not all test results   are available during your visit. If your test results are not back during the visit, make an appointment with your caregiver to find out the results. Do not assume everything is normal if you have not heard from your caregiver or the medical facility. It is important for you to follow up on all of your test results. HOME CARE INSTRUCTIONS   Do not drive for 24 hours or as instructed.   Only take over-the-counter or prescription medicines for pain, discomfort, or fever as directed by your caregiver.   Do not take aspirin. It can cause or  aggravate bleeding.   Do not drive or drink alcohol while taking pain medicine.   You may resume your usual diet.   Do not use tampons, douche, or have sexual intercourse for 2 weeks, or as advised by your caregiver.   Rest and sleep for the first 24 to 48 hours.   Take your temperature twice a day for 4 to 5 days. Write it down. Give these temperatures to your caregiver if they are abnormal (above 98.6 F or 37.0 C).   Take medicines your caregiver has ordered as directed.   Follow your caregiver's advice regarding diet, exercise, lifting, driving, and general activities.   Take showers instead of baths for 2 weeks, or as recommended by your caregiver.   If you develop constipation:   Take a mild laxative with the advice of your caregiver.   Eat bran foods.   Drink enough water and fluids to keep your urine clear or pale yellow.   Try to have someone with you or available to you for the first 24 to 48 hours, especially if you had a general anesthetic.   Make sure you and your family understand everything about your operation and recovery.   Follow your caregiver's advice regarding follow-up appointments and Pap smears.  SEEK MEDICAL CARE IF:   You feel dizzy or lightheaded.   You feel sick to your stomach (nauseous).   You develop abnormal vaginal discharge.   You develop a rash.   You have an abnormal reaction or allergy to your medicine.   You need stronger pain medicine.  SEEK IMMEDIATE MEDICAL CARE IF:   Bleeding is heavier than a normal menstrual period or you have blood clots.   You have an oral temperature above 102 F (38.9 C), not controlled by medicine.   You have increasing cramps or pains not relieved with medicine.   You develop belly (abdominal) pain that does not seem to be related to the same area of earlier cramping and pain.   You pass out.   You develop pain in the tops of your shoulders (shoulder strap areas).   You develop shortness of  breath.  MAKE SURE YOU:   Understand these instructions.   Will watch your condition.   Will get help right away if you are not doing well or get worse.  Document Released: 09/13/2000 Document Revised: 05/27/2011 Document Reviewed: 01/06/2009 [facility] Patient Information 2012 [place], [place].Diagnostic Laparoscopy Laparoscopy is a surgical procedure. It is used to diagnose and treat diseases inside the belly(abdomen). It is usually a brief, common, and relatively simple procedure. The laparoscopeis a thin, lighted, pencil-sized instrument. It is like a telescope. It is inserted into your abdomen through a small cut (incision). Your caregiver can look at the organs inside your body through this instrument. He or she can see if there is anything abnormal. Laparoscopy can be done either in a hospital or outpatient  clinic. You may be given a mild sedative to help you relax before the procedure. Once in the operating room, you will be given a drug to make you sleep (general anesthesia). Laparoscopy usually lasts less than 1 hour. After the procedure, you will be monitored in a recovery area until you are stable and doing well. Once you are home, it will take 2 to 3 days to fully recover. RISKS AND COMPLICATIONS  Laparoscopy has relatively few risks. Your caregiver will discuss the risks with you before the procedure. Some problems that can occur include:  Infection.   Bleeding.   Damage to other organs.   Anesthetic side effects.  PROCEDURE Once you receive anesthesia, your surgeon inflates the abdomen with a harmless gas (carbon dioxide). This makes the organs easier to see. The laparoscope is inserted into the abdomen through a small incision. This allows your surgeon to see into the abdomen. Other small instruments are also inserted into the abdomen through other small openings. Many surgeons attach a video camera to the laparoscope to enlarge the view. During a diagnostic laparoscopy, the  surgeon may be looking for inflammation, infection, or cancer. Your surgeon may take tissue samples(biopsies). The samples are sent to a specialist in looking at cells and tissue samples (pathologist). The pathologist examines them under a microscope. Biopsies can help to diagnose or confirm a disease. AFTER THE PROCEDURE   The gas is released from inside the abdomen.   The incisions are closed with stitches (sutures). Because these incisions are small (usually less than 1/2 inch), there is usually minimal discomfort after the procedure. There may be some mild discomfort in the throat. This is from the tube placed in the throat while you were sleeping. You may have some mild abdominal discomfort. There may also be discomfort from the instrument placement incisions in the abdomen.   The recovery time is shortened as long as there are no complications.   You will rest in a recovery room until stable and doing well. As long as there are no complications, you may be allowed to go home.  FINDING OUT THE RESULTS OF YOUR TEST Not all test results are available during your visit. If your test results are not back during the visit, make an appointment with your caregiver to find out the results. Do not assume everything is normal if you have not heard from your caregiver or the medical facility. It is important for you to follow up on all of your test results. HOME CARE INSTRUCTIONS   Take all medicines as directed.   Only take over-the-counter or prescription medicines for pain, discomfort, or fever as directed by your caregiver.   Resume daily activities as directed.   Showers are preferred over baths.   You may resume sexual activities in 1 week or as directed.   Do not drive while taking narcotics.  SEEK MEDICAL CARE IF:   There is increasing abdominal pain.   There is new pain in the shoulders (shoulder strap areas).   You feel lightheaded or faint.   You have the chills.   You or your  child has an oral temperature above 102 F (38.9 C).   There is pus-like (purulent) drainage from any of the wounds.   You are unable to pass gas or have a bowel movement.   You feel sick to your stomach (nauseous) or throw up (vomit).  MAKE SURE YOU:   Understand these instructions.   Will watch your condition.  Will get help right away if you are not doing well or get worse.  Document Released: 09/13/2000 Document Revised: 05/27/2011 Document Reviewed: 06/07/2007 [facility] Patient Information 2012 [place], [place].Hormone Therapy At menopause, your body begins making less estrogen and progesterone hormones. This causes the body to stop having menstrual periods. This is because estrogen and progesterone hormones control your periods and menstrual cycle. A lack of estrogen may cause symptoms such as:  Hot flushes (or hot flashes).   Vaginal dryness.   Dry skin.   Loss of sex drive.   Risk of bone loss (osteoporosis).  When this happens, you may choose to take hormone therapy to get back the estrogen lost during menopause. When the hormone estrogen is given alone, it is usually referred to as ET (Estrogen Therapy). When the hormone progestin is combined with estrogen, it is generally called HT (Hormone Therapy). This was formerly known as hormone replacement therapy (HRT). Your caregiver can help you make a decision on what will be best for you. The decision to use HT seems to change often as new studies are done. Many studies do not agree on the benefits of hormone replacement therapy. LIKELY BENEFITS OF HT INCLUDE PROTECTION FROM:  Hot Flushes (also called hot flashes) - A hot flush is a sudden feeling of heat that spreads over the face and body. The skin may redden like a blush. It is connected with sweats and sleep disturbance. Women going through menopause may have hot flushes a few times a month or several times per day depending on the woman.   Osteoporosis (bone loss)- Estrogen  helps guard against bone loss. After menopause, a woman's bones slowly lose calcium and become weak and brittle. As a result, bones are more likely to break. The hip, wrist, and spine are affected most often. Hormone therapy can help slow bone loss after menopause. Weight bearing exercise and taking calcium with vitamin D also can help prevent bone loss. There are also medications that your caregiver can prescribe that can help prevent osteoporosis.   Vaginal Dryness - Loss of estrogen causes changes in the vagina. Its lining may become thin and dry. These changes can cause pain and bleeding during sexual intercourse. Dryness can also lead to infections. This can cause burning and itching. (Vaginal estrogen treatment can help relieve pain, itching, and dryness.)   Urinary Tract Infections are more common after menopause because of lack of estrogen. Some women also develop urinary incontinence because of low estrogen levels in the vagina and bladder.   Possible other benefits of estrogen include a positive effect on mood and short-term memory in women.  RISKS AND COMPLICATIONS  Using estrogen alone without progesterone causes the lining of the uterus to grow. This increases the risk of lining of the uterus (endometrial) cancer. Your caregiver should give another hormone called progestin if you have a uterus.   Women who take combined (estrogen and progestin) HT appear to have an increased risk of breast cancer. The risk appears to be small, but increases throughout the time that HT is taken.   Combined therapy also makes the breast tissue slightly denser which makes it harder to read mammograms (breast X-rays).   Combined, estrogen and progesterone therapy can be taken together every day, in which case there may be spotting of blood. HT therapy can be taken cyclically in which case you will have menstrual periods. Cyclically means HT is taken for a set amount of days, then not taken, then this process  is repeated.   HT may increase the risk of stroke, heart attack, breast cancer and forming blood clots in your leg.   Transdermal estrogen (estrogen that is absorbed through the skin with a patch or a cream) may have more positive results with:   Cholesterol.   Blood pressure.   Blood clots.  Having the following conditions may indicate you should not have HT:  Endometrial cancer.   Liver disease.   Breast cancer.   Heart disease.   History of blood clots.   Stroke.  TREATMENT   If you choose to take HT and have a uterus, usually estrogen and progestin are prescribed.   Your caregiver will help you decide the best way to take the medications.   Possible ways to take estrogen include:   Pills.   Patches.   Gels.   Sprays.   Vaginal estrogen cream, rings and tablets.   It is best to take the lowest dose possible that will help your symptoms and take them for the shortest period of time that you can.   Hormone therapy can help relieve some of the problems (symptoms) that affect women at menopause. Before making a decision about HT, talk to your caregiver about what is best for you. Be well informed and comfortable with your decisions.  HOME CARE INSTRUCTIONS   Follow your caregivers advice when taking the medications.   A Pap test is done to screen for cervical cancer.   The first Pap test should be done at age 70.   Between ages 29 and 18, Pap tests are repeated every 2 years.   Beginning at age 46, you are advised to have a Pap test every 3 years as long as your past 3 Pap tests have been normal.   Some women have medical problems that increase the chance of getting cervical cancer. Talk to your caregiver about these problems. It is especially important to talk to your caregiver if a new problem develops soon after your last Pap test. In these cases, your caregiver may recommend more frequent screening and Pap tests.   The above recommendations are the same  for women who have or have not gotten the vaccine for HPV (Human Papillomavirus).   If you had a hysterectomy for a problem that was not a cancer or a condition that could lead to cancer, then you no longer need Pap tests. However, even if you no longer need a Pap test, a regular exam is a good idea to make sure no other problems are starting.    If you are between ages 80 and 89, and you have had normal Pap tests going back 10 years, you no longer need Pap tests. However, even if you no longer need a Pap test, a regular exam is a good idea to make sure no other problems are starting.    If you have had past treatment for cervical cancer or a condition that could lead to cancer, you need Pap tests and screening for cancer for at least 20 years after your treatment.   If Pap tests have been discontinued, risk factors (such as a new sexual partner) need to be re-assessed to determine if screening should be resumed.   Some women may need screenings more often if they are at high risk for cervical cancer.   Get mammograms done as per the advice of your caregiver.  SEEK IMMEDIATE MEDICAL CARE IF:  You develop abnormal vaginal bleeding.   You have pain  or swelling in your legs, shortness of breath, or chest pain.   You develop dizziness or headaches.   You have lumps or changes in your breasts or armpits.   You have slurred speech.   You develop weakness or numbness of your arms or legs.   You have pain, burning, or bleeding when urinating.   You develop abdominal pain.  Document Released: 03/06/2003 Document Revised: 05/27/2011 Document Reviewed: 06/24/2010 [facility] Patient Information 2012 [place], [place].

## 2012-02-23 ENCOUNTER — Telehealth: Payer: Self-pay | Admitting: Obstetrics and Gynecology

## 2012-02-23 ENCOUNTER — Other Ambulatory Visit: Payer: Self-pay | Admitting: *Deleted

## 2012-02-23 ENCOUNTER — Telehealth: Payer: Self-pay | Admitting: Gynecology

## 2012-02-23 DIAGNOSIS — N63 Unspecified lump in unspecified breast: Secondary | ICD-10-CM

## 2012-02-23 NOTE — Telephone Encounter (Signed)
Correct. Thank you.

## 2012-02-23 NOTE — Telephone Encounter (Signed)
Patient informed surgery scheduled for 03/13/12 at 7:30am at [facility]. Pt instructed.  Patient said that she is having some spotting again today.  She said Dr. Glenetta Hew gave her Megace to take twice a day for the ten days prior so surgery and she questioned should she go ahead and take it now.  I told her that as long as just spotty no need to take now.  I explained the purpose of the Megace would be to suppress bleeding so that she will not be bleeding at surgery.  I told her to call and report if it becomes a flow.  OK?

## 2012-02-23 NOTE — Telephone Encounter (Signed)
error 

## 2012-02-25 ENCOUNTER — Encounter: Payer: Self-pay | Admitting: Gynecology

## 2012-02-28 ENCOUNTER — Inpatient Hospital Stay
Admission: RE | Admit: 2012-02-28 | Discharge: 2012-02-28 | Disposition: A | Discharge status: Home / Self Care | Payer: 59 | Source: Ambulatory Visit | Attending: Gynecology | Admitting: Gynecology

## 2012-02-28 ENCOUNTER — Encounter (HOSPITAL_COMMUNITY): Payer: Self-pay | Admitting: Pharmacist

## 2012-02-28 DIAGNOSIS — N63 Unspecified lump in unspecified breast: Secondary | ICD-10-CM

## 2012-02-29 ENCOUNTER — Telehealth: Payer: Self-pay | Admitting: Gynecology

## 2012-02-29 ENCOUNTER — Other Ambulatory Visit: Payer: Self-pay | Admitting: Gynecology

## 2012-02-29 DIAGNOSIS — N950 Postmenopausal bleeding: Secondary | ICD-10-CM

## 2012-02-29 MED ORDER — MEGESTROL ACETATE 40 MG PO TABS: 40.0000 mg | ORAL_TABLET | Freq: Two times a day (BID) | ORAL | Status: DC

## 2012-02-29 NOTE — Telephone Encounter (Signed)
Left message home and cell phone for patient to call me.

## 2012-02-29 NOTE — Telephone Encounter (Signed)
Patient advised and rx e-scribed to pharmacy.

## 2012-02-29 NOTE — Telephone Encounter (Signed)
Tell her she can take the Megace 40 mg twice a day until the time of her surgery. If she ran out please call in 10 more tablets or enough to cover her until her surgery

## 2012-02-29 NOTE — Telephone Encounter (Signed)
Patient is scheduled for surgery 9/23-Lap BSO, Diag D&C, Hysteroscopy.  She reports she was spotting last week and over the weekend it became a steady flow with cramps.  She wanted me to let you know.

## 2012-03-07 ENCOUNTER — Encounter (HOSPITAL_COMMUNITY)
Admission: RE | Admit: 2012-03-07 | Discharge: 2012-03-07 | Disposition: A | Discharge status: Home / Self Care | Payer: 59 | Source: Ambulatory Visit | Attending: Gynecology | Admitting: Gynecology

## 2012-03-07 ENCOUNTER — Encounter (HOSPITAL_COMMUNITY): Payer: Self-pay

## 2012-03-07 HISTORY — DX: Other complications of anesthesia, initial encounter: T88.59XA

## 2012-03-07 HISTORY — DX: Adverse effect of unspecified anesthetic, initial encounter: T41.45XA

## 2012-03-07 HISTORY — DX: Headache: R51

## 2012-03-07 LAB — CBC
Hemoglobin: 13.6 g/dL (ref 12.0–15.0)
MCHC: 32.2 g/dL (ref 30.0–36.0)
RBC: 4.92 MIL/uL (ref 3.87–5.11)
WBC: 6.5 10*3/uL (ref 4.0–10.5)

## 2012-03-07 LAB — SURGICAL PCR SCREEN
MRSA, PCR: NEGATIVE
Staphylococcus aureus: POSITIVE — AB

## 2012-03-07 LAB — BASIC METABOLIC PANEL
Chloride: 100 mEq/L (ref 96–112)
GFR calc non Af Amer: >90 mL/min (ref >90)
Glucose, Bld: 89 mg/dL (ref 70–99)
Potassium: 4 mEq/L (ref 3.5–5.1)
Sodium: 137 mEq/L (ref 135–145)

## 2012-03-07 NOTE — Pre-Procedure Instructions (Signed)
EKG requested from Dr Jacqulyn Bath office-greater than 6 months- will do one at pat visit today,.

## 2012-03-07 NOTE — Patient Instructions (Addendum)
   Your procedure is scheduled on: Monday September 23rd  Enter through the Main Entrance of [facility] at: 6am Pick up the phone at the desk and dial [phone] and inform us of your arrival.  Please call this number if you have any problems the morning of surgery: [phone]  Remember: Do not eat or drink anything after midnight on Sunday Please take your ramipril, celexa, and wellbutrin morning of surgery with sips of water.  Please hold your Actos morning of surgery  Do not wear jewelry, make-up, or FINGER nail polish No metal in your hair or on your body. Do not wear lotions, powders, perfumes Do not shave 12 hours prior to surgery Do not bring valuables to the hospital. Contacts may not be worn into surgery.  Patients discharged on the day of surgery will not be allowed to drive home.

## 2012-03-13 ENCOUNTER — Encounter (HOSPITAL_COMMUNITY): Payer: Self-pay | Admitting: *Deleted

## 2012-03-13 ENCOUNTER — Encounter (HOSPITAL_COMMUNITY): Payer: Self-pay | Admitting: Anesthesiology

## 2012-03-13 ENCOUNTER — Encounter (HOSPITAL_COMMUNITY)
Admission: RE | Disposition: A | Discharge status: Home / Self Care | Payer: Self-pay | Source: Ambulatory Visit | Attending: Gynecology

## 2012-03-13 ENCOUNTER — Ambulatory Visit (HOSPITAL_COMMUNITY): Payer: 59 | Admitting: Anesthesiology

## 2012-03-13 ENCOUNTER — Ambulatory Visit (HOSPITAL_COMMUNITY)
Admission: RE | Admit: 2012-03-13 | Discharge: 2012-03-13 | Disposition: A | Discharge status: Home / Self Care | Payer: 59 | Source: Ambulatory Visit | Attending: Gynecology | Admitting: Gynecology

## 2012-03-13 DIAGNOSIS — N83209 Unspecified ovarian cyst, unspecified side: Secondary | ICD-10-CM

## 2012-03-13 DIAGNOSIS — N840 Polyp of corpus uteri: Secondary | ICD-10-CM | POA: Insufficient documentation

## 2012-03-13 DIAGNOSIS — Z01812 Encounter for preprocedural laboratory examination: Secondary | ICD-10-CM | POA: Insufficient documentation

## 2012-03-13 DIAGNOSIS — N950 Postmenopausal bleeding: Secondary | ICD-10-CM | POA: Insufficient documentation

## 2012-03-13 DIAGNOSIS — Z01818 Encounter for other preprocedural examination: Secondary | ICD-10-CM | POA: Insufficient documentation

## 2012-03-13 HISTORY — PX: SALPINGOOPHORECTOMY: SHX82

## 2012-03-13 HISTORY — PX: LAPAROSCOPY: SHX197

## 2012-03-13 LAB — URINALYSIS, ROUTINE W REFLEX MICROSCOPIC
Hgb urine dipstick: NEGATIVE
Nitrite: NEGATIVE
Specific Gravity, Urine: >1.03 — ABNORMAL HIGH (ref 1.005–1.030)
Urobilinogen, UA: 0.2 mg/dL (ref 0.0–1.0)

## 2012-03-13 LAB — URINE MICROSCOPIC-ADD ON

## 2012-03-13 LAB — PREGNANCY, URINE: Preg Test, Ur: NEGATIVE

## 2012-03-13 SURGERY — Surgical Case
Anesthesia: *Unknown

## 2012-03-13 SURGERY — LAPAROSCOPY OPERATIVE
Anesthesia: General | Site: Uterus | Wound class: Clean Contaminated

## 2012-03-13 MED ORDER — ONDANSETRON HCL 4 MG/2ML IJ SOLN
INTRAMUSCULAR | Status: AC
  Filled 2012-03-13: qty 2

## 2012-03-13 MED ORDER — FENTANYL CITRATE 0.05 MG/ML IJ SOLN
INTRAMUSCULAR | Status: AC
  Filled 2012-03-13: qty 5

## 2012-03-13 MED ORDER — LIDOCAINE HCL (CARDIAC) 20 MG/ML IV SOLN
INTRAVENOUS | Status: AC
  Filled 2012-03-13: qty 5

## 2012-03-13 MED ORDER — PHENYLEPHRINE 40 MCG/ML (10ML) SYRINGE FOR IV PUSH (FOR BLOOD PRESSURE SUPPORT)
PREFILLED_SYRINGE | INTRAVENOUS | Status: AC
  Filled 2012-03-13: qty 5

## 2012-03-13 MED ORDER — KETOROLAC TROMETHAMINE 30 MG/ML IJ SOLN
INTRAMUSCULAR | Status: DC | PRN
  Administered 2012-03-13: 60 mg via INTRAVENOUS

## 2012-03-13 MED ORDER — LACTATED RINGERS IV SOLN
INTRAVENOUS | Status: DC
  Administered 2012-03-13 (×4): via INTRAVENOUS

## 2012-03-13 MED ORDER — METHYLENE BLUE 1 % INJ SOLN
INTRAMUSCULAR | Status: AC
  Filled 2012-03-13: qty 1

## 2012-03-13 MED ORDER — METOCLOPRAMIDE HCL 10 MG PO TABS: ORAL_TABLET | ORAL | Status: DC

## 2012-03-13 MED ORDER — ROCURONIUM BROMIDE 50 MG/5ML IV SOLN
INTRAVENOUS | Status: AC
  Filled 2012-03-13: qty 1

## 2012-03-13 MED ORDER — PROPOFOL 10 MG/ML IV EMUL
INTRAVENOUS | Status: AC
  Filled 2012-03-13: qty 20

## 2012-03-13 MED ORDER — NEOSTIGMINE METHYLSULFATE 1 MG/ML IJ SOLN
INTRAMUSCULAR | Status: DC | PRN
  Administered 2012-03-13: 2 mg via INTRAVENOUS

## 2012-03-13 MED ORDER — ONDANSETRON HCL 4 MG/2ML IJ SOLN
INTRAMUSCULAR | Status: DC | PRN
  Administered 2012-03-13: 4 mg via INTRAVENOUS

## 2012-03-13 MED ORDER — GLYCOPYRROLATE 0.2 MG/ML IJ SOLN
INTRAMUSCULAR | Status: AC
  Filled 2012-03-13: qty 2

## 2012-03-13 MED ORDER — CEFAZOLIN SODIUM-DEXTROSE 2-3 GM-% IV SOLR
INTRAVENOUS | Status: AC
  Filled 2012-03-13: qty 50

## 2012-03-13 MED ORDER — BUPIVACAINE HCL (PF) 0.25 % IJ SOLN
INTRAMUSCULAR | Status: AC
  Filled 2012-03-13: qty 30

## 2012-03-13 MED ORDER — MIDAZOLAM HCL 2 MG/2ML IJ SOLN
INTRAMUSCULAR | Status: AC
  Filled 2012-03-13: qty 2

## 2012-03-13 MED ORDER — MIDAZOLAM HCL 5 MG/5ML IJ SOLN
INTRAMUSCULAR | Status: DC | PRN
  Administered 2012-03-13: 2 mg via INTRAVENOUS

## 2012-03-13 MED ORDER — ONDANSETRON HCL 4 MG/2ML IJ SOLN: 4.0000 mg | Freq: Once | INTRAMUSCULAR | Status: DC | PRN

## 2012-03-13 MED ORDER — PHENYLEPHRINE HCL 10 MG/ML IJ SOLN
INTRAMUSCULAR | Status: DC | PRN
  Administered 2012-03-13 (×3): 40 ug via INTRAVENOUS
  Administered 2012-03-13: 80 ug via INTRAVENOUS

## 2012-03-13 MED ORDER — MEPERIDINE HCL 25 MG/ML IJ SOLN: 6.2500 mg | INTRAMUSCULAR | Status: DC | PRN

## 2012-03-13 MED ORDER — BUPIVACAINE HCL (PF) 0.25 % IJ SOLN
INTRAMUSCULAR | Status: DC | PRN
  Administered 2012-03-13: 30 mL

## 2012-03-13 MED ORDER — KETOROLAC TROMETHAMINE 60 MG/2ML IM SOLN
INTRAMUSCULAR | Status: AC
  Filled 2012-03-13: qty 2

## 2012-03-13 MED ORDER — HEPARIN SODIUM (PORCINE) 5000 UNIT/ML IJ SOLN
INTRAMUSCULAR | Status: DC | PRN
  Administered 2012-03-13: 5000 [IU] via SUBCUTANEOUS

## 2012-03-13 MED ORDER — CEFAZOLIN SODIUM-DEXTROSE 2-3 GM-% IV SOLR
2.0000 g | INTRAVENOUS | Status: AC
  Administered 2012-03-13: 2 g via INTRAVENOUS

## 2012-03-13 MED ORDER — HYDROCODONE-ACETAMINOPHEN 2.5-500 MG PO TABS: 1.0000 | ORAL_TABLET | Freq: Four times a day (QID) | ORAL | Status: DC | PRN

## 2012-03-13 MED ORDER — LIDOCAINE HCL (CARDIAC) 20 MG/ML IV SOLN
INTRAVENOUS | Status: DC | PRN
  Administered 2012-03-13: 100 mg via INTRAVENOUS

## 2012-03-13 MED ORDER — FENTANYL CITRATE 0.05 MG/ML IJ SOLN: 25.0000 ug | INTRAMUSCULAR | Status: DC | PRN

## 2012-03-13 MED ORDER — KETOROLAC TROMETHAMINE 30 MG/ML IJ SOLN: 15.0000 mg | Freq: Once | INTRAMUSCULAR | Status: DC | PRN

## 2012-03-13 MED ORDER — LACTATED RINGERS IR SOLN
Status: DC | PRN
  Administered 2012-03-13: 3000 mL

## 2012-03-13 MED ORDER — NEOSTIGMINE METHYLSULFATE 1 MG/ML IJ SOLN
INTRAMUSCULAR | Status: AC
  Filled 2012-03-13: qty 10

## 2012-03-13 MED ORDER — PROPOFOL 10 MG/ML IV EMUL
INTRAVENOUS | Status: DC | PRN
  Administered 2012-03-13: 180 mg via INTRAVENOUS

## 2012-03-13 MED ORDER — FENTANYL CITRATE 0.05 MG/ML IJ SOLN
INTRAMUSCULAR | Status: DC | PRN
  Administered 2012-03-13 (×5): 50 ug via INTRAVENOUS

## 2012-03-13 MED ORDER — SILVER NITRATE-POT NITRATE 75-25 % EX MISC
CUTANEOUS | Status: AC
  Filled 2012-03-13: qty 1

## 2012-03-13 MED ORDER — GLYCOPYRROLATE 0.2 MG/ML IJ SOLN
INTRAMUSCULAR | Status: DC | PRN
  Administered 2012-03-13: 0.4 mg via INTRAVENOUS

## 2012-03-13 MED ORDER — ROCURONIUM BROMIDE 100 MG/10ML IV SOLN
INTRAVENOUS | Status: DC | PRN
  Administered 2012-03-13: 5 mg via INTRAVENOUS
  Administered 2012-03-13: 25 mg via INTRAVENOUS

## 2012-03-13 SURGICAL SUPPLY — 46 items
ADH SKN CLS APL DERMABOND .7 (GAUZE/BANDAGES/DRESSINGS)
BAG SPEC RTRVL LRG 6X4 10 (ENDOMECHANICALS)
BARRIER ADHS 3X4 INTERCEED (GAUZE/BANDAGES/DRESSINGS) IMPLANT
BLADE SURG 15 STRL LF C SS BP (BLADE) ×3 IMPLANT
BLADE SURG 15 STRL SS (BLADE) ×4
BRR ADH 4X3 ABS CNTRL BYND (GAUZE/BANDAGES/DRESSINGS)
CABLE HIGH FREQUENCY MONO STRZ (ELECTRODE) IMPLANT
CANISTER SUCTION 2500CC (MISCELLANEOUS) ×4 IMPLANT
CATH ROBINSON RED A/P 16FR (CATHETERS) ×4 IMPLANT
CLOTH BEACON ORANGE TIMEOUT ST (SAFETY) ×4 IMPLANT
CONTAINER PREFILL 10% NBF 60ML (FORM) ×8 IMPLANT
CORD ACTIVE DISPOSABLE (ELECTRODE) ×1
CORD ELECTRO ACTIVE DISP (ELECTRODE) ×3 IMPLANT
DERMABOND ADVANCED (GAUZE/BANDAGES/DRESSINGS)
DERMABOND ADVANCED .7 DNX12 (GAUZE/BANDAGES/DRESSINGS) IMPLANT
DRESSING TELFA 8X3 (GAUZE/BANDAGES/DRESSINGS) ×4 IMPLANT
ELECT LOOP GYNE PRO 24FR (CUTTING LOOP)
ELECT REM PT RETURN 9FT ADLT (ELECTROSURGICAL) ×4
ELECT VAPORTRODE GRVD BAR (ELECTRODE) IMPLANT
ELECTRODE LOOP GYNE PRO 24FR (CUTTING LOOP) IMPLANT
ELECTRODE REM PT RTRN 9FT ADLT (ELECTROSURGICAL) ×3 IMPLANT
ELECTRODE ROLLER BARREL 22FR (ELECTROSURGICAL) ×1 IMPLANT
GLOVE BIOGEL PI IND STRL 8 (GLOVE) ×3 IMPLANT
GLOVE BIOGEL PI INDICATOR 8 (GLOVE) ×1
GLOVE ECLIPSE 7.5 STRL STRAW (GLOVE) ×8 IMPLANT
GOWN PREVENTION PLUS LG XLONG (DISPOSABLE) ×12 IMPLANT
GOWN STRL REIN XL XLG (GOWN DISPOSABLE) ×4 IMPLANT
NS IRRIG 1000ML POUR BTL (IV SOLUTION) ×4 IMPLANT
PACK LAPAROSCOPY BASIN (CUSTOM PROCEDURE TRAY) ×4 IMPLANT
PAD OB MATERNITY 4.3X12.25 (PERSONAL CARE ITEMS) ×4 IMPLANT
PAD PREP 24X48 CUFFED NSTRL (MISCELLANEOUS) ×4 IMPLANT
POUCH SPECIMEN RETRIEVAL 10MM (ENDOMECHANICALS) IMPLANT
PROTECTOR NERVE ULNAR (MISCELLANEOUS) ×4 IMPLANT
SCALPEL HARMONIC ACE (MISCELLANEOUS) ×1 IMPLANT
SET IRRIG TUBING LAPAROSCOPIC (IRRIGATION / IRRIGATOR) IMPLANT
SLEEVE Z-THREAD 5X100MM (TROCAR) IMPLANT
SOLUTION ELECTROLUBE (MISCELLANEOUS) IMPLANT
SUT PLAIN 4 0 FS 2 27 (SUTURE) ×4 IMPLANT
SUT VIC AB 3-0 PS2 18 (SUTURE) ×4
SUT VIC AB 3-0 PS2 18XBRD (SUTURE) ×3 IMPLANT
SUT VICRYL 0 UR6 27IN ABS (SUTURE) ×4 IMPLANT
TOWEL OR 17X24 6PK STRL BLUE (TOWEL DISPOSABLE) ×8 IMPLANT
TRAY FOLEY CATH 14FR (SET/KITS/TRAYS/PACK) ×4 IMPLANT
TROCAR Z-THREAD FIOS 11X100 BL (TROCAR) ×4 IMPLANT
TROCAR Z-THREAD FIOS 5X100MM (TROCAR) ×4 IMPLANT
WATER STERILE IRR 1000ML POUR (IV SOLUTION) ×4 IMPLANT

## 2012-03-13 NOTE — Interval H&P Note (Signed)
History and Physical Interval Note:  03/13/2012 7:08 AM  Patricia Delacruz  has presented today for surgery, with the diagnosis of ovarian cyst, post menopausal bleeding  The various methods of treatment have been discussed with the patient and family. After consideration of risks, benefits and other options for treatment, the patient has consented to  Procedure(s) (LRB) with comments: LAPAROSCOPY OPERATIVE (N/A) SALPINGO OOPHERECTOMY (Bilateral) DILATATION & CURETTAGE/HYSTEROSCOPY WITH RESECTOCOPE (N/A) - with trueclear as a surgical intervention .  The patient's history has been reviewed, patient examined, no change in status, stable for surgery.  I have reviewed the patient's chart and labs.  Questions were answered to the patient's satisfaction.     Ok Edwards

## 2012-03-13 NOTE — Anesthesia Postprocedure Evaluation (Signed)
Anesthesia Post Note  Patient: Patricia Delacruz  Procedure(s) Performed: Procedure(s) (LRB): LAPAROSCOPY OPERATIVE (N/A) SALPINGO OOPHERECTOMY (Bilateral) DILATATION & CURETTAGE/HYSTEROSCOPY WITH RESECTOCOPE (N/A)  Anesthesia type: General  Patient location: PACU  Post pain: Pain level controlled  Post assessment: Post-op Vital signs reviewed  Last Vitals:  Filed Vitals:   03/13/12 1000  BP: 126/65  Pulse: 75  Temp: 36.2 C  Resp: 18    Post vital signs: Reviewed  Level of consciousness: sedated  Complications: No apparent anesthesia complications

## 2012-03-13 NOTE — H&P (View-Only) (Signed)
Patricia Delacruz is an 56 y.o. female. Who presented to the office today for preoperative consultation as a result of her postmenopausal bleeding and ovarian cyst. 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 Last Pap smear on record indicated atypical squamous cells of undetermined significance with high risk HPV and patient did not followup.Patient stated her last colonoscopy was in 2010 and was normal. Her Pap smear on August 9 demonstrated the following:  NEGATIVE FOR INTRAEPITHELIAL LESIONS OR MALIGNANCY. ENDOMETRIAL CELLS PRESENT IN A POSTMENOPAUSAL WOMAN. CORRELATION WITH CLINICAL HISTORY AND/OR TISSUE STUDIES ARE RECOMMENDED AS CLINICALLY INDICATED.  Her endometrial biopsy demonstrated the following:  1. Endometrium, biopsy - DEGENERATING SECRETORY-TYPE ENDOMETRIUM. - BENIGN ENDOCERVICAL TYPE MUCOSA. - THERE IS NO EVIDENCE OF HYPERPLASIA OR MALIGNANCY - SEE COMMENT. 2. Vagina, biopsy - BENIGN SQUAMOUS LINED MUCOSA WITH MINIMAL INFLAMMATION. - THERE IS NO EVIDENCE OF MALIGNANCY. Microscopic Comment 1. This pattern can be associated with menses, irregular shedding or breakthrough bleeding associated with hormonal therapy  Her ultrasound/sono hysterogram on August 14 demonstrated the following:  Uterus measures 7.0 x 4.7 x 4.4 cm with endometrial stripe of 6.2 mm. Prominent endometrial cavity echogenic was noted. Right ovary had a thin smooth walled solid mass measuring 29 x 26 x 29 mm arterial blood flow was seen in the ovarian tissue mass negative for color flow. Left ovary was atrophic no fluid in the cul-de-sac. Sonohysterogram posterior uterine defect left uterine wall measuring 6 x 11 mm and fundal calcified area measuring 13 x 16 mm.  Her chest x-ray PA and lateral was  normal  MRI of the pelvis with and without contrast results as follows:  Complex cystic mass within the right adnexa measuring approximately 2.6 x 2.2 x 2.65 cm for which a hemorrhagic cyst is favored. Heterogeneity of the uterine myometrium for which leiomyomatous change is favored   Patient been given Megace 40 mg to take 1 by mouth twice a day for 10 days to stop her bleeding. Her recent CBC, TSH, and prolactin was normal. Her CA 125 was normal at 13.8. She's been followed by Dr. Roy Morrero for type 2 diabetes.  Pertinent Gynecological History: Menses: post-menopausal Bleeding: post menopausal bleeding Contraception: none DES exposure: denies Blood transfusions: none Sexually transmitted diseases: Condyloma acuminatum Previous GYN Procedures: CO2 laser ablation of CIN-1 and CIN-2 and condyloma acuminatum  Last mammogram: abnormal: Dense breasts additional views requested pending results Date: 2013 Last pap: See above Date: 2013 OB History: G 4, P 3 A1   Menstrual History: Menarche age: 14 Patient's last menstrual period was 05/18/2010.    Past Medical History  Diagnosis Date  . Diabetes mellitus     GESTATIONAL  . Condyloma acuminata     VULVAR  . High risk HPV infection   . CIN II (cervical intraepithelial neoplasia II)   . Anxiety   . Depression   . Hypertension     Past Surgical History  Procedure Date  . Cesarean section 1990  . Co 2 laser      CERVIX /VAGINA  . Colonoscopy NOV/03/2008    NEXT IN 10 YEARS    Family History  Problem Relation Age of Onset  . Cancer Mother     LYMPHOMA  . Diabetes Father     Social History:  reports that she has never   smoked. She has never used smokeless tobacco. She reports that she drinks alcohol. She reports that she does not use illicit drugs.  Allergies:  Allergies  Allergen Reactions  . Tetracyclines & Related      (Not in a hospital admission)  REVIEW OF SYSTEMS: A ROS was performed and pertinent positives  and negatives are included in the history.  GENERAL: No fevers or chills. HEENT: No change in vision, no earache, sore throat or sinus congestion. NECK: No pain or stiffness. CARDIOVASCULAR: No chest pain or pressure. No palpitations. PULMONARY: No shortness of breath, cough or wheeze. GASTROINTESTINAL: No abdominal pain, nausea, vomiting or diarrhea, melena or bright red blood per rectum. GENITOURINARY: No urinary frequency, urgency, hesitancy or dysuria. MUSCULOSKELETAL: No joint or muscle pain, no back pain, no recent trauma. DERMATOLOGIC: No rash, no itching, no lesions. ENDOCRINE: No polyuria, polydipsia, no heat or cold intolerance. No recent change in weight. HEMATOLOGICAL: No anemia or easy bruising or bleeding. NEUROLOGIC: No headache, seizures, numbness, tingling or weakness. PSYCHIATRIC: No depression, no loss of interest in normal activity or change in sleep pattern.     Blood pressure 130/86, last menstrual period 05/18/2010.  Physical Exam:  HEENT:unremarkable Neck:Supple, midline, no thyroid megaly, no carotid bruits Lungs:  Clear to auscultation no rhonchi's or wheezes Heart:Regular rate and rhythm, no murmurs or gallops Breast Exam: Not examined Abdomen: Soft nontender no rebound or guarding  Pelvic:BUS within normal limits Vagina: No lesions or discharge except the previous left vaginal fornix raised area that were biopsy which was benign Cervix: No gross lesions on inspection Uterus: Anteverted upper limits of normal Adnexa: Right adnexal fullness no rebound guarding Extremities: No cords, no edema Rectal: Not done  No results found for this or any previous visit (from the past 24 hour(s)).  No results found.  Assessment/Plan: Patient with postmenopausal bleeding along with endometrial thickness of 6.2 mm with no evidence of endometrial hyperplasia on office endometrial biopsy. Sonohysterogram with posterior uterine defect left uterine wall measuring 6 x 11 mm and  fundal calcified area measuring 13 x 16 mm requires further evaluation. Also,Right ovary had a thin smooth walled solid mass measuring 29 x 26 x 29 mm arterial blood flow was seen in the ovarian tissue mass negative for color flow. Although she has a normal CA 125 and being postmenopausal requires further evaluation. We had a detailed discussion today of the findings and alternative options for further evaluation to include the following: Proceed with a total laparoscopic hysterectomy and bilateral salpingo-oophorectomy or laparoscopic bilateral salpingo-oophorectomy along with diagnostic hysteroscopy with a vigorous D&C and await pathology report and refer to GYN oncologist if malignant. Patient would like to proceed with the latter. The following risk were discussed:                        Patient was counseled as to the risk of surgery to include the following:  1. Infection (prohylactic antibiotics will be administered)  2. DVT/Pulmonary Embolism (prophylactic pneumo compression stockings will be used)  3.Trauma to internal organs requiring additional surgical procedure to repair any injury to     Internal organs requiring perhaps additional hospitalization days.  4.Hemmorhage requiring transfusion and blood products which carry risks such as anaphylactic reaction, hepatitis and AIDS  Patient had received literature information on the procedure scheduled and all her questions were answered and accepts all risk.  FERNANDEZ,JUAN HMD5:40 PMTD@    FERNANDEZ,JUAN H 02/22/2012, 5:21 PM   

## 2012-03-13 NOTE — Anesthesia Preprocedure Evaluation (Signed)
Anesthesia Evaluation  Patient identified by MRN, date of birth, ID band Patient awake    Reviewed: Allergy & Precautions, H&P , NPO status , Patient's Chart, lab work & pertinent test results, reviewed documented beta blocker date and time   History of Anesthesia Complications Negative for: history of anesthetic complications  Airway Mallampati: III TM Distance: <3 FB Neck ROM: full    Dental  (+) Teeth Intact   Pulmonary neg pulmonary ROS,  breath sounds clear to auscultation  Pulmonary exam normal       Cardiovascular Exercise Tolerance: Good hypertension, On Medications Rhythm:regular Rate:Normal     Neuro/Psych PSYCHIATRIC DISORDERS (anxiety/depression) negative neurological ROS     GI/Hepatic negative GI ROS, Neg liver ROS,   Endo/Other  diabetes, Type 2, Oral Hypoglycemic AgentsMorbid obesity  Renal/GU negative Renal ROS  Female GU complaint     Musculoskeletal   Abdominal   Peds  Hematology negative hematology ROS (+)   Anesthesia Other Findings   Reproductive/Obstetrics negative OB ROS                           Anesthesia Physical Anesthesia Plan  ASA: III  Anesthesia Plan: General ETT   Post-op Pain Management:    Induction:   Airway Management Planned:   Additional Equipment:   Intra-op Plan:   Post-operative Plan:   Informed Consent: I have reviewed the patients History and Physical, chart, labs and discussed the procedure including the risks, benefits and alternatives for the proposed anesthesia with the patient or authorized representative who has indicated his/her understanding and acceptance.   Dental Advisory Given  Plan Discussed with: CRNA and Surgeon  Anesthesia Plan Comments:         Anesthesia Quick Evaluation

## 2012-03-13 NOTE — Op Note (Signed)
03/13/2012  9:00 AM  PATIENT:  Patricia Delacruz  56 y.o. female  PRE-OPERATIVE DIAGNOSIS:  Right Ovarian Cyst, Post Menopausal Bleeding  POST-OPERATIVE DIAGNOSIS: Right  Ovarian Cyst, Post Menopausal Bleeding, endometrial polyps  PROCEDURE:  Procedure(s): Laparoscopic bilateral salpingo-oophorectomy with pelvic washings. Resectoscopic polypectomy  SURGEON:  Surgeon(s): Ok Edwards, MD Dara Lords, MD  ANESTHESIA:   general  FINDINGS: Right ovary larger than left ovary smooth in appearance no excrescences were noted anterior and posterior cul-de-sac were normal. Peritoneal surfaces were clear. Uterus surface was normal.  DESCRIPTION OF OPERATION: The patient was taken to the operating room where she underwent successful general endotracheal anesthesia. Time out was accomplished identifying the patient properly and the procedure to be undertaken as well the patient's allergy. Patient received 2 g Cefotan preop. PSA stockings were in place for DVT prophylaxis. The abdomen vagina and perineum were prepped and draped in the usual sterile fashion. A red rubber Roxan Hockey had been inserted to evacuate the bladder is content for approximately 20 cc. A bimanual examination had demonstrated an anteverted uterus for which a Hulka tenaculum was placed for manipulation during the laparoscopic procedure. A small semilunar incision was made in the umbilicus and a 10/11 mm Optiview trocar was inserted. A pneumoperitoneum was established with approximately 2 and a half liters of carbon dioxide.Two 5 mm ports were placed in the right and left patient's lower abdomen respectively under laparoscopic guidance. A systematic inspection of the patient's pelvis demonstrated the right ovary larger than the left ovary but its surface was normal in appearance with no excrescences seen. Peritoneal surfaces were smooth and clear there was no lesions in the posterior or anterior cul-de-sac. Attention was then placed on  the right infundibular pelvic ligament and it was quite a distance from the right ureter. The right infundibulopelvic ligament was then coaptated and transected with the harmonic scalpel and the remainder of the meso-salpinx was coaptated and transected to the proximal portion of the fallopian tube. The proximal fallopian tube and utero-ovarian ligament were coaptated and transected. The right tube and ovary was then placed in the cul-de-sac. A similar procedure was carried out on the contralateral side. Both tubes and ovary were removed after an Endopouch had been inserted to retrieve both tubes and ovaries. When they were removed through the subumbilical incision site the specimen was submitted for histological evaluation after proper identification. Pelvic washings had been obtained before the bilateral salpingo-oophorectomy. The pelvic cavity was once again  irrigated with normal saline solution. The pneumoperitoneum was removed. The trochars were removed. This umbilicus incision fascia was closed with a figure-of-eight of 0 Vicryl suture and the subcutaneous tissues were reapproximated with 3-0 Vicryl suture. The skin edges were reapproximated with Dermabond glue as were the two 5 mm port sites. Attention was then placed to the hysteroscopic portion of the procedure. The patient's legs were then placed in the high lithotomy position. The Hulka tenaculum was removed. A weighted speculum was placed in the posterior vaginal vault. A single-tooth tenaculum was then placed on the anterior cervical lip. The uterus sounded to 7-1/2 cm. Pratt dilator was utilized to dilate the cervix for which the largest size was 17 mm. Following this the true clear resectoscopic morcellator (5 mm scope/2.9 mm incisor blade) was inserted into the intrauterine cavity. Normal saline was the distending media. Multiple endometrial polyps were noted on left fundal region as well as the lower uterine left side endometrium. Some were found  in the anterior uterine wall on  patient's right. These were morcellated and passed off the operative field for histological evaluation. Pre-and post morcellation pictures were obtained. The instruments were removed the single-tooth tenaculum was removed. Fluid deficit was approximately 50 cc. Patient tolerated procedure well was extubated and transferred to recovery room with stable vital signs.  ESTIMATED BLOOD LOSS: Minimal  Intake/Output Summary (Last 24 hours) at 03/13/12 0900 Last data filed at 03/13/12 0815  Gross per 24 hour  Intake   1000 ml  Output    100 ml  Net    900 ml     BLOOD ADMINISTERED:none   LOCAL MEDICATIONS USED:  MARCAINE   0.25% subcutaneous incision ports for postop analgesia total 10 cc  SPECIMEN:  Source of Specimen:  Bilateral tubes and ovaries and endometrial polyps  DISPOSITION OF SPECIMEN:  PATHOLOGY  COUNTS:  YES  PLAN OF CARE: Transfer to PACU  [facility] HMD9:00 AMTD@

## 2012-03-13 NOTE — Transfer of Care (Signed)
Immediate Anesthesia Transfer of Care Note  Patient: Patricia Delacruz  Procedure(s) Performed: Procedure(s) (LRB) with comments: LAPAROSCOPY OPERATIVE (N/A) SALPINGO OOPHERECTOMY (Bilateral) DILATATION & CURETTAGE/HYSTEROSCOPY WITH RESECTOCOPE (N/A) - w/ Truclear procedure started at 0828  Patient Location: PACU  Anesthesia Type: General  Level of Consciousness: awake, oriented and patient cooperative  Airway & Oxygen Therapy: Patient Spontanous Breathing and Patient connected to face mask oxygen  Post-op Assessment: Report given to PACU RN and Post -op Vital signs reviewed and stable  Post vital signs: Reviewed and stable  Complications: No apparent anesthesia complications

## 2012-03-14 ENCOUNTER — Encounter (HOSPITAL_COMMUNITY): Payer: Self-pay | Admitting: Gynecology

## 2012-03-18 MED FILL — Heparin Sodium (Porcine) Inj 5000 Unit/ML: INTRAMUSCULAR | Qty: 1 | Status: AC

## 2012-03-27 ENCOUNTER — Encounter: Payer: Self-pay | Admitting: Gynecology

## 2012-03-27 ENCOUNTER — Ambulatory Visit (INDEPENDENT_AMBULATORY_CARE_PROVIDER_SITE_OTHER): Payer: 59 | Admitting: Gynecology

## 2012-03-27 DIAGNOSIS — Z9889 Other specified postprocedural states: Secondary | ICD-10-CM

## 2012-03-27 MED ORDER — CLINDAMYCIN PHOSPHATE 2 % VA CREA: 1.0000 | TOPICAL_CREAM | Freq: Every day | VAGINAL | Status: DC

## 2012-03-27 NOTE — Progress Notes (Signed)
Patient presented to the office for her 2 weeks postop visit. She is status post bilateral laparoscopic salpingo-oophorectomy along with resectoscopic polypectomy as a result of history of a right ovarian cyst and postmenopausal bleeding.  Pathology report: Diagnosis 1. Ovary and fallopian tube, right - BENIGN OVARIAN TISSUE WITH ASSOCIATED HEMORRHAGIC CYST, NO EVIDENCE OF ENDOMETRIOSIS, ATYPIA OR MALIGNANCY. - FALLOPIAN TUBAL TISSUE, NO PATHOLOGIC ABNORMALITIES. 2. Ovary and fallopian tube, left - BENIGN OVARIAN TISSUE AND FALLOPIAN TUBAL TISSUE, NO PATHOLOGIC ABNORMALITIES. 3. Endometrial polyp - BENIGN ENDOMETRIAL POLYP, NO ATYPIA, HYPERPLASIA OR MALIGNANCY.  Exam: Abdominal ports completely healed abdomen soft nontender no rebound or guarding Pelvic: Bartholin urethra Skene was within normal limits Vagina: No lesions or discharge Cervix no lesion discharge Uterus: Anteverted normal size shape and consistency Adnexa no palpable masses or tenderness Rectal exam not done  Assessment/plan: Patient status post laparoscopic bilateral salpingo-oophorectomy and resectoscopic polypectomy with benign pathology may resume full normal activity at the end of this week. She will return back to the office next year for her annual exam or when necessary. She was not having any menopausal symptoms we will continue to monitor in the next few months. Instructions were provided of what to look out for. She scheduled to return to the office next year for her annual exam or when necessary.

## 2012-03-29 IMAGING — US US ABDOMEN COMPLETE
1 series · 14 of 25 positions shown · non-contrast
Comparison: 

CLINICAL DATA: Abnormal liver function tests, right upper quadrant
pain for a week,

COMPLETE ABDOMINAL ULTRASOUND

[Series 1: us abdomen complete · 0.28mm/px · 14 of 59 slices shown]
[im 1/59]
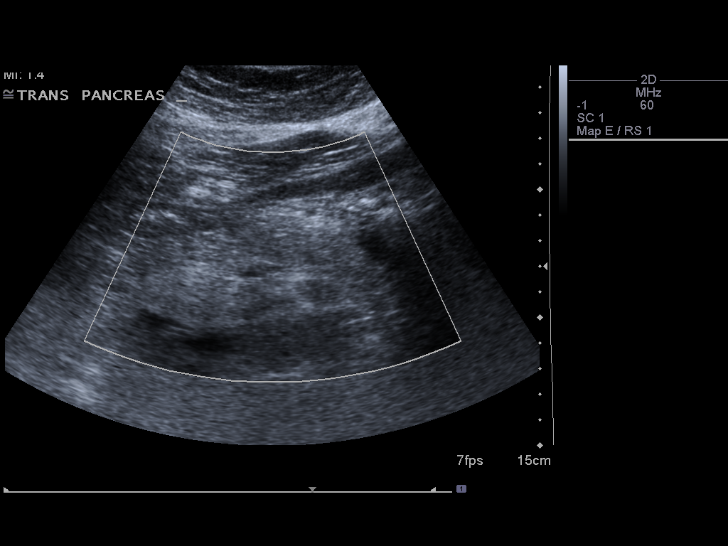
[im 5/59]
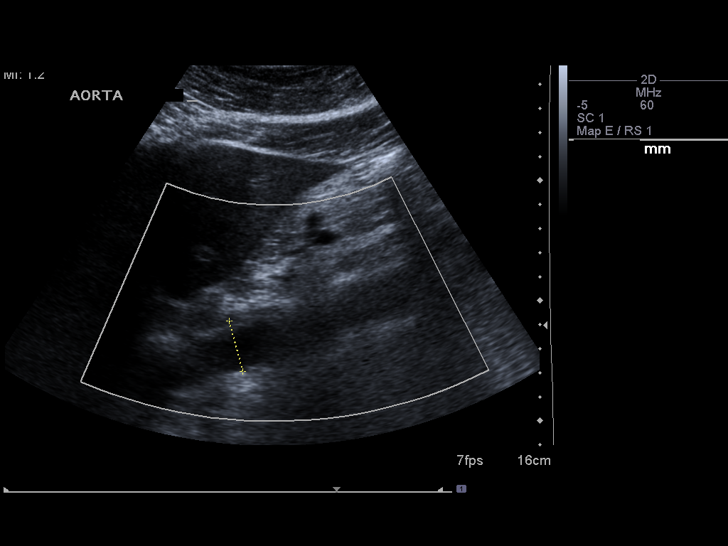
[im 10/59]
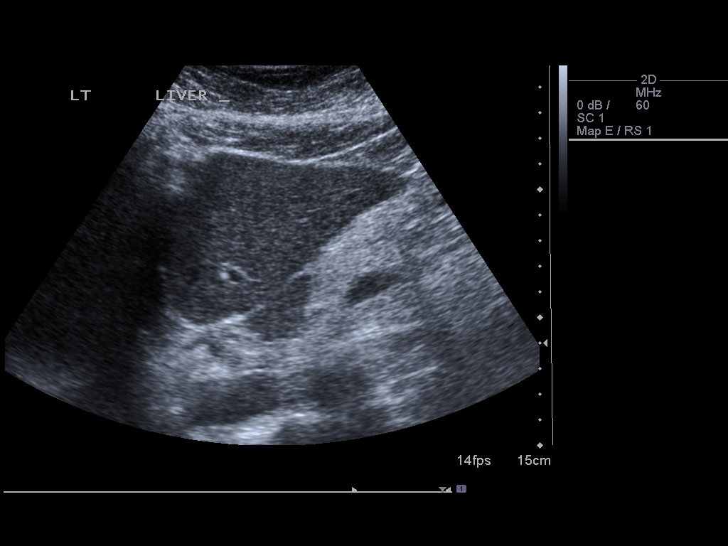
[im 15/59]
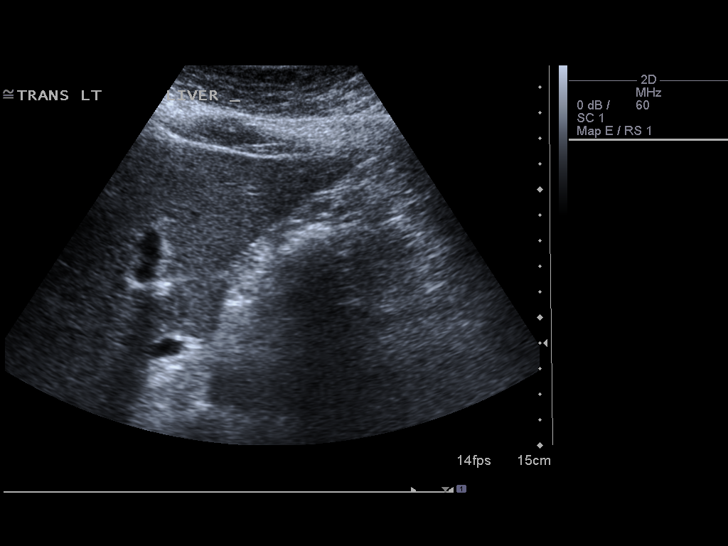
[im 20/59]
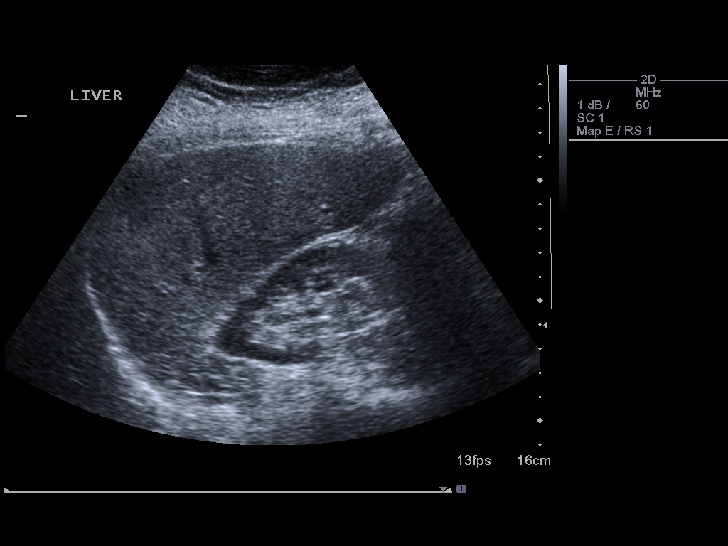
[im 22/59]
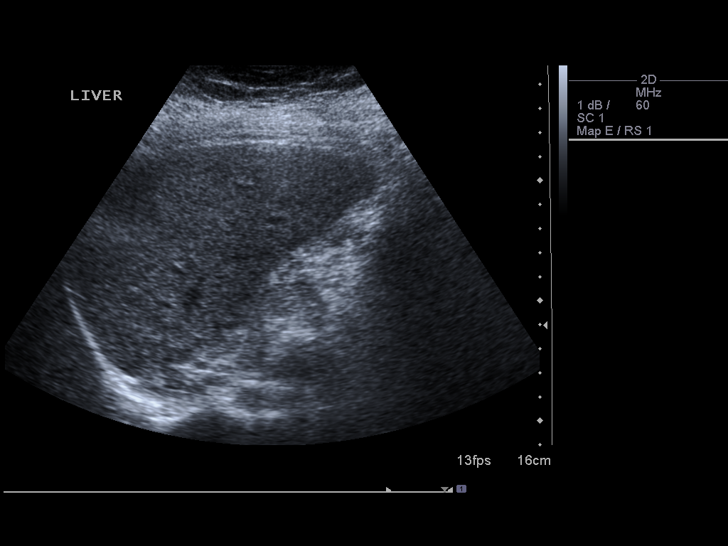
[im 27/59]
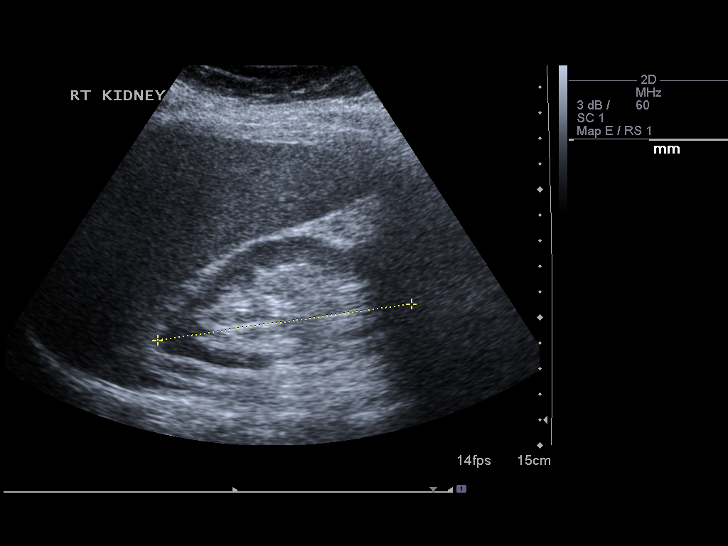
[im 32/59]
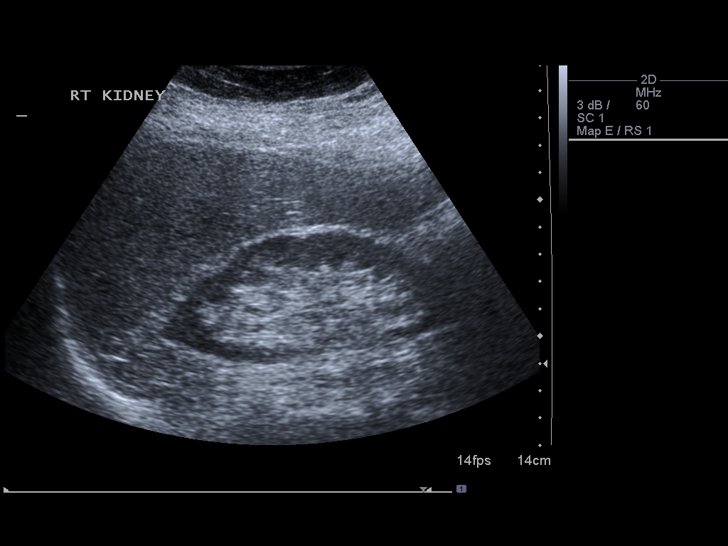
[im 37/59]
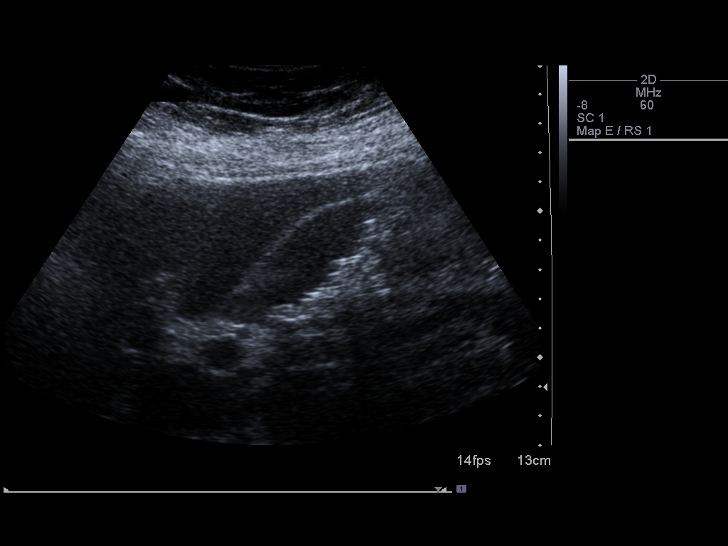
[im 39/59]
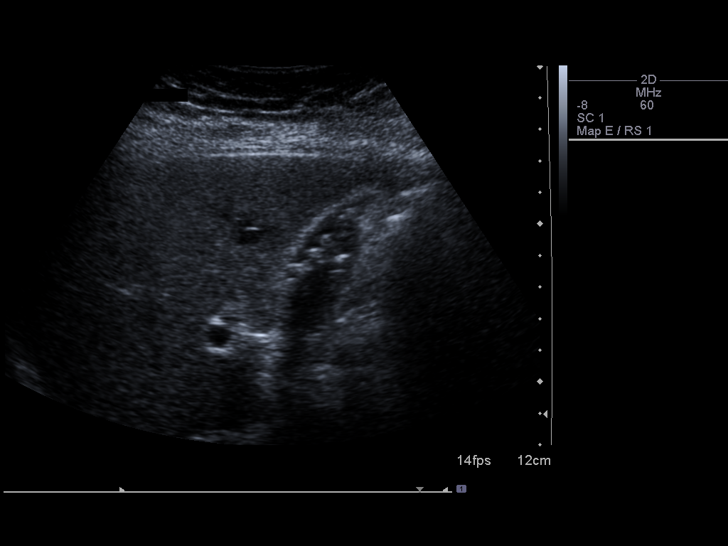
[im 44/59]
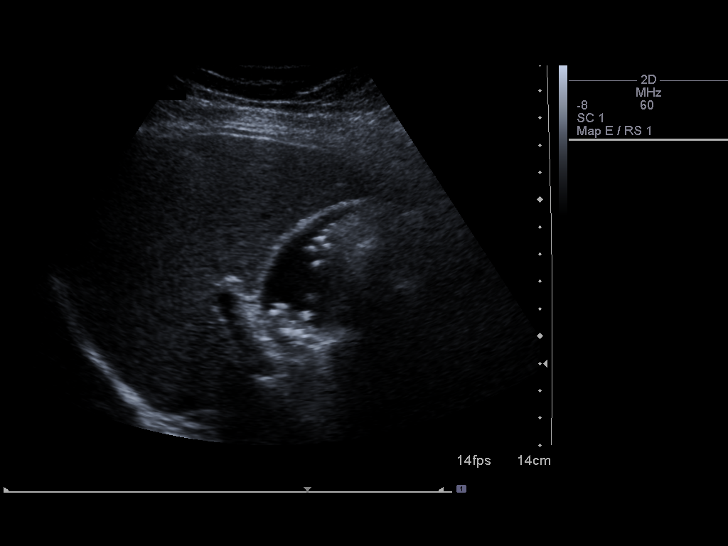
[im 49/59]
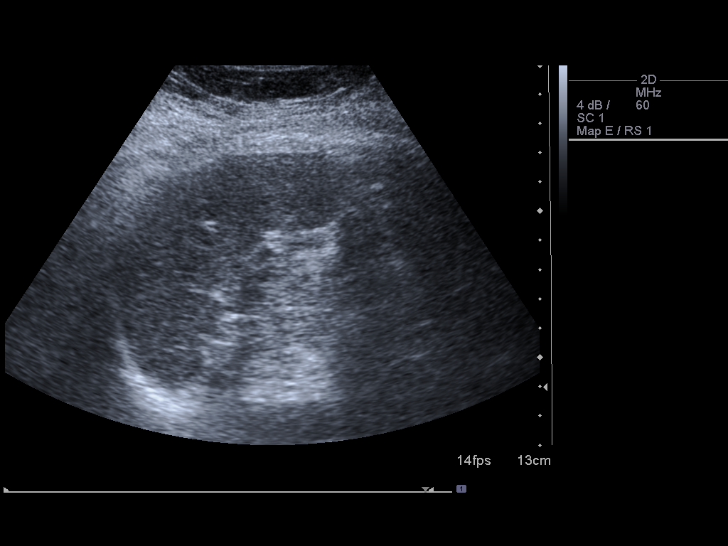
[im 54/59]
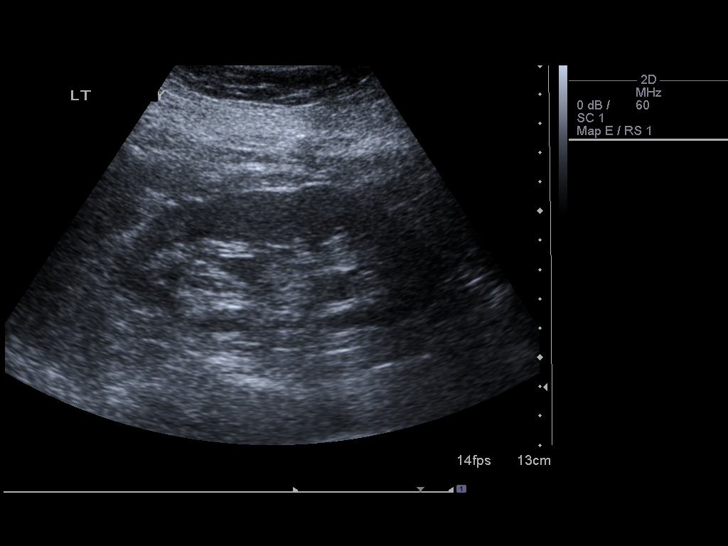
[im 59/59]
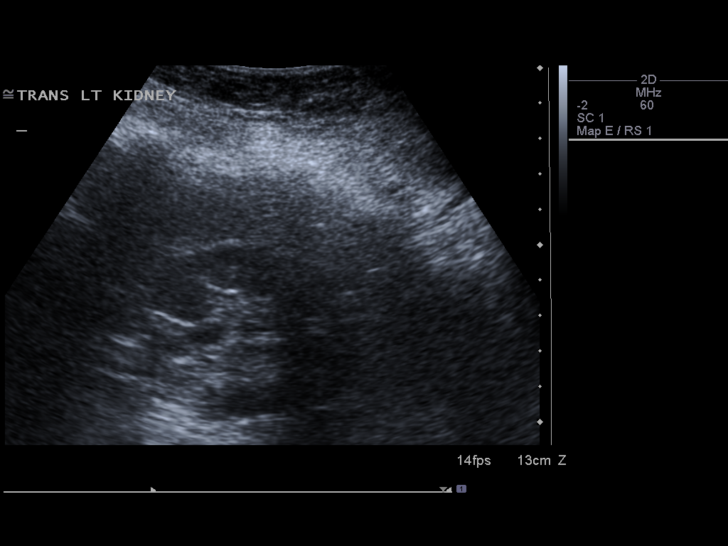

[14 of 25 positions shown; findings below may reference images not displayed]

FINDINGS: Gallbladder:  Multiple gallstones are noted within the gallbladder
with shadowing.  No pain is present over the gallbladder.

Common bile duct:  The common bile duct is normal measuring 2.2 mm
in diameter.

Liver:  The liver has a normal echogenic pattern.  No ductal
dilatation is seen.

IVC:  Appears normal.

Pancreas:  The pancreas is largely obscured by bowel gas.

Spleen:  The spleen is normal measuring 7.1 cm sagittally.

Right Kidney:  No hydronephrosis is seen.  The right kidney
measures 11.2 cm sagittally with mild cortical thinning.

Left Kidney:  The left kidney measures 11.3 cm, with very slight
prominence of the left pelvocaliceal system.

Abdominal aorta:  The abdominal aorta is normal in caliber.
IMPRESSION: 1.  Multiple small gallstones with shadowing.  No pain over
gallbladder.
2.  No ductal dilatation.
3.  The pancreas is obscured by bowel gas.
4.  Slight prominence of the pelvocaliceal system of the left
kidney.

## 2012-04-04 ENCOUNTER — Telehealth: Payer: Self-pay | Admitting: *Deleted

## 2012-04-04 MED ORDER — MEGESTROL ACETATE 40 MG PO TABS: 40.0000 mg | ORAL_TABLET | Freq: Two times a day (BID) | ORAL | Status: DC

## 2012-04-04 NOTE — Telephone Encounter (Signed)
Pt is post op bilateral laparoscopic salpingo-oophorectomy on 03/13/12. Pt is calling c/o vaginal bleeding since last Wednesday not heavy bleeding, but problem like flow. Pt thought she would just see spotting, no cramping, changing pads every 4 hours. pt asked if this is normal. Please advise

## 2012-04-04 NOTE — Telephone Encounter (Signed)
Please: Prescription for Megace 40 mg one by mouth twice a day for 10 days #20. And the will monitor she continues to bleed after that she will need to followup in the office.

## 2012-04-04 NOTE — Telephone Encounter (Signed)
Pt informed with the below note. rx sent 

## 2012-05-09 ENCOUNTER — Ambulatory Visit (INDEPENDENT_AMBULATORY_CARE_PROVIDER_SITE_OTHER): Payer: 59 | Admitting: Gynecology

## 2012-05-09 ENCOUNTER — Encounter: Payer: Self-pay | Admitting: Gynecology

## 2012-05-09 VITALS — BP 138/88

## 2012-05-09 DIAGNOSIS — N949 Unspecified condition associated with female genital organs and menstrual cycle: Secondary | ICD-10-CM

## 2012-05-09 DIAGNOSIS — Z79890 Hormone replacement therapy (postmenopausal): Secondary | ICD-10-CM

## 2012-05-09 DIAGNOSIS — N938 Other specified abnormal uterine and vaginal bleeding: Secondary | ICD-10-CM

## 2012-05-09 DIAGNOSIS — N898 Other specified noninflammatory disorders of vagina: Secondary | ICD-10-CM

## 2012-05-09 DIAGNOSIS — N951 Menopausal and female climacteric states: Secondary | ICD-10-CM

## 2012-05-09 LAB — WET PREP FOR TRICH, YEAST, CLUE
Clue Cells Wet Prep HPF POC: NONE SEEN
Trich, Wet Prep: NONE SEEN
Yeast Wet Prep HPF POC: NONE SEEN

## 2012-05-09 NOTE — Progress Notes (Signed)
Patient is a 56 year old was seen in the office on October 7 for 2 weeks postop visit. She was status post bilateral laparoscopic salpingo-oophorectomy with resectoscopic polypectomy as a result of a right ovarian cyst. Pathology reported demonstrated hemorrhagic cyst and no malignancy. Left tube and ovary were normal. Resectoscopic polypectomy demonstrated benign endometrial polyp with no atypia or hyperplasia. She had presented today because she had called the office and had some breakthrough bleeding after her surgery and was placed on Megace 40 mg twice a day for 10 days. She stated that when she finished the medication she started to rebleed but today lesser amount and is more dark. In August of this year as part of her evaluation for dysfunctional uterine bleeding before her surgery she had a benign endometrial biopsy. Patient had been menopausal for several years. She had not been on any hormone replacement therapy. She does states now that she's had some vasomotor symptoms after her surgery but cannot pinpoint if her symptoms started before after the Megace was initiated.  Exam: Bartholin urethra Skene was within normal limits Vagina: Dark brown discharge was noted such as old blood. Cervix: No lesions or discharge Uterus: No palpable masses or tenderness anteverted Adnexa: No palpable masses or tenderness Rectal exam: Not done  Wet prep: Negative  Assessment/plan: Patient 6 weeks status post bilateral salpingo-oophorectomy along with resectoscopic polypectomy with breakthrough bleeding after procedure had responded with Megace but restarted with some light bleeding after completion. I've given the patient several options as follows: #1 to continue to monitor it and it should be discontinuing and stop all together since she no longer since her ovaries and we recently remove the polyps from her uterus. #2 we offered her hormone replacement therapy for vasomotor symptoms and they were to persist  after she wears off the effects of the Megace. I've given her literature information on hormone replacement therapy. #3 if her bleeding continues but her menopausal symptoms are gone we also discussed about placing a Mirena IUD since she is not interested in a hysterectomy. She has decided to proceed with option 1 and followup with me in one month of continuation of symptoms. I've given her literature information on the menopause and hormone replacement therapy. Her mammogram was this year and was normal.

## 2012-05-09 NOTE — Patient Instructions (Addendum)
Menopause Menopause is the normal time of life when menstrual periods stop completely. Menopause is complete when you have missed 12 consecutive menstrual periods. It usually occurs between the ages of 48 to 55, with an average age of 51. Very rarely does a woman develop menopause before 54 years old. At menopause, your ovaries stop producing the female hormones, estrogen and progesterone. This can cause undesirable symptoms and also affect your health. Sometimes the symptoms may occur 4 to 5 years before the menopause begins. There is no relationship between menopause and:  Oral contraceptives.  Number of children you had.  Race.  The age your menstrual periods started (menarche). Heavy smokers and very thin women may develop menopause earlier in life. CAUSES  The ovaries stop producing the female hormones estrogen and progesterone.  Other causes include:  Surgery to remove both ovaries.  The ovaries stop functioning for no known reason.  Tumors of the pituitary gland in the brain.  Medical disease that affects the ovaries and hormone production.  Radiation treatment to the abdomen or pelvis.  Chemotherapy that affects the ovaries. SYMPTOMS   Hot flashes.  Night sweats.  Decrease in sex drive.  Vaginal dryness and thinning of the vagina causing painful intercourse.  Dryness of the skin and developing wrinkles.  Headaches.  Tiredness.  Irritability.  Memory problems.  Weight gain.  Bladder infections.  Hair growth of the face and chest.  Infertility. More serious symptoms include:  Loss of bone (osteoporosis) causing breaks (fractures).  Depression.  Hardening and narrowing of the arteries (atherosclerosis) causing heart attacks and strokes. DIAGNOSIS   When the menstrual periods have stopped for 12 straight months.  Physical exam.  Hormone studies of the blood. TREATMENT  There are many treatment choices and nearly as many questions about them.  The decisions to treat or not to treat menopausal changes is an individual choice made with your caregiver. Your caregiver can discuss the treatments with you. Together, you can decide which treatment will work best for you. Your treatment choices may include:   Hormone therapy (estorgen and progesterone).  Non-hormonal medications.  Treating the individual symptoms with medication (for example antidepressants for depression).  Herbal medications that may help specific symptoms.  Counseling by a psychiatrist or psychologist.  Group therapy.  Lifestyle changes including:  Eating healthy.  Regular exercise.  Limiting caffeine and alcohol.  Stress management and meditation.  No treatment. HOME CARE INSTRUCTIONS   Take the medication your caregiver gives you as directed.  Get plenty of sleep and rest.  Exercise regularly.  Eat a diet that contains calcium (good for the bones) and soy products (acts like estrogen hormone).  Avoid alcoholic beverages.  Do not smoke.  If you have hot flashes, dress in layers.  Take supplements, calcium and vitamin D to strengthen bones.  You can use over-the-counter lubricants or moisturizers for vaginal dryness.  Group therapy is sometimes very helpful.  Acupuncture may be helpful in some cases. SEEK MEDICAL CARE IF:   You are not sure you are in menopause.  You are having menopausal symptoms and need advice and treatment.  You are still having menstrual periods after age 55.  You have pain with intercourse.  Menopause is complete (no menstrual period for 12 months) and you develop vaginal bleeding.  You need a referral to a specialist (gynecologist, psychiatrist or psychologist) for treatment. SEEK IMMEDIATE MEDICAL CARE IF:   You have severe depression.  You have excessive vaginal bleeding.  You fell and   think you have a broken bone.  You have pain when you urinate.  You develop leg or chest pain.  You have a fast  pounding heart beat (palpitations).  You have severe headaches.  You develop vision problems.  You feel a lump in your breast.  You have abdominal pain or severe indigestion. Document Released: 08/28/2003 Document Revised: 08/30/2011 Document Reviewed: 04/04/2008 [employer] Patient Information 2013 [employer].   Hormone Therapy At menopause, your body begins making less estrogen and progesterone hormones. This causes the body to stop having menstrual periods. This is because estrogen and progesterone hormones control your periods and menstrual cycle. A lack of estrogen may cause symptoms such as:  Hot flushes (or hot flashes).  Vaginal dryness.  Dry skin.  Loss of sex drive.  Risk of bone loss (osteoporosis). When this happens, you may choose to take hormone therapy to get back the estrogen lost during menopause. When the hormone estrogen is given alone, it is usually referred to as ET (Estrogen Therapy). When the hormone progestin is combined with estrogen, it is generally called HT (Hormone Therapy). This was formerly known as hormone replacement therapy (HRT). Your caregiver can help you make a decision on what will be best for you. The decision to use HT seems to change often as new studies are done. Many studies do not agree on the benefits of hormone replacement therapy. LIKELY BENEFITS OF HT INCLUDE PROTECTION FROM:  Hot Flushes (also called hot flashes) - A hot flush is a sudden feeling of heat that spreads over the face and body. The skin may redden like a blush. It is connected with sweats and sleep disturbance. Women going through menopause may have hot flushes a few times a month or several times per day depending on the woman.  Osteoporosis (bone loss)- Estrogen helps guard against bone loss. After menopause, a woman's bones slowly lose calcium and become weak and brittle. As a result, bones are more likely to break. The hip, wrist, and spine are affected most often.  Hormone therapy can help slow bone loss after menopause. Weight bearing exercise and taking calcium with vitamin D also can help prevent bone loss. There are also medications that your caregiver can prescribe that can help prevent osteoporosis.  Vaginal Dryness - Loss of estrogen causes changes in the vagina. Its lining may become thin and dry. These changes can cause pain and bleeding during sexual intercourse. Dryness can also lead to infections. This can cause burning and itching. (Vaginal estrogen treatment can help relieve pain, itching, and dryness.)  Urinary Tract Infections are more common after menopause because of lack of estrogen. Some women also develop urinary incontinence because of low estrogen levels in the vagina and bladder.  Possible other benefits of estrogen include a positive effect on mood and short-term memory in women. RISKS AND COMPLICATIONS  Using estrogen alone without progesterone causes the lining of the uterus to grow. This increases the risk of lining of the uterus (endometrial) cancer. Your caregiver should give another hormone called progestin if you have a uterus.  Women who take combined (estrogen and progestin) HT appear to have an increased risk of breast cancer. The risk appears to be small, but increases throughout the time that HT is taken.  Combined therapy also makes the breast tissue slightly denser which makes it harder to read mammograms (breast X-rays).  Combined, estrogen and progesterone therapy can be taken together every day, in which case there may be spotting of blood. HT   therapy can be taken cyclically in which case you will have menstrual periods. Cyclically means HT is taken for a set amount of days, then not taken, then this process is repeated.  HT may increase the risk of stroke, heart attack, breast cancer and forming blood clots in your leg.  Transdermal estrogen (estrogen that is absorbed through the skin with a patch or a cream) may  have more positive results with:  Cholesterol.  Blood pressure.  Blood clots. Having the following conditions may indicate you should not have HT:  Endometrial cancer.  Liver disease.  Breast cancer.  Heart disease.  History of blood clots.  Stroke. TREATMENT   If you choose to take HT and have a uterus, usually estrogen and progestin are prescribed.  Your caregiver will help you decide the best way to take the medications.  Possible ways to take estrogen include:  Pills.  Patches.  Gels.  Sprays.  Vaginal estrogen cream, rings and tablets.  It is best to take the lowest dose possible that will help your symptoms and take them for the shortest period of time that you can.  Hormone therapy can help relieve some of the problems (symptoms) that affect women at menopause. Before making a decision about HT, talk to your caregiver about what is best for you. Be well informed and comfortable with your decisions. HOME CARE INSTRUCTIONS   Follow your caregivers advice when taking the medications.  A Pap test is done to screen for cervical cancer.  The first Pap test should be done at age 21.  Between ages 21 and 29, Pap tests are repeated every 2 years.  Beginning at age 30, you are advised to have a Pap test every 3 years as long as your past 3 Pap tests have been normal.  Some women have medical problems that increase the chance of getting cervical cancer. Talk to your caregiver about these problems. It is especially important to talk to your caregiver if a new problem develops soon after your last Pap test. In these cases, your caregiver may recommend more frequent screening and Pap tests.  The above recommendations are the same for women who have or have not gotten the vaccine for HPV (Human Papillomavirus).  If you had a hysterectomy for a problem that was not a cancer or a condition that could lead to cancer, then you no longer need Pap tests. However, even if  you no longer need a Pap test, a regular exam is a good idea to make sure no other problems are starting.   If you are between ages 65 and 70, and you have had normal Pap tests going back 10 years, you no longer need Pap tests. However, even if you no longer need a Pap test, a regular exam is a good idea to make sure no other problems are starting.   If you have had past treatment for cervical cancer or a condition that could lead to cancer, you need Pap tests and screening for cancer for at least 20 years after your treatment.  If Pap tests have been discontinued, risk factors (such as a new sexual partner) need to be re-assessed to determine if screening should be resumed.  Some women may need screenings more often if they are at high risk for cervical cancer.  Get mammograms done as per the advice of your caregiver. SEEK IMMEDIATE MEDICAL CARE IF:  You develop abnormal vaginal bleeding.  You have pain or swelling in your legs,   shortness of breath, or chest pain.  You develop dizziness or headaches.  You have lumps or changes in your breasts or armpits.  You have slurred speech.  You develop weakness or numbness of your arms or legs.  You have pain, burning, or bleeding when urinating.  You develop abdominal pain. Document Released: 03/06/2003 Document Revised: 08/30/2011 Document Reviewed: 06/24/2010 [employer] Patient Information 2013 [employer].   

## 2013-08-17 ENCOUNTER — Encounter: Payer: Self-pay | Admitting: Gynecology

## 2013-08-17 ENCOUNTER — Ambulatory Visit (INDEPENDENT_AMBULATORY_CARE_PROVIDER_SITE_OTHER): Payer: 59 | Admitting: Gynecology

## 2013-08-17 VITALS — BP 128/70

## 2013-08-17 DIAGNOSIS — N949 Unspecified condition associated with female genital organs and menstrual cycle: Secondary | ICD-10-CM

## 2013-08-17 DIAGNOSIS — N898 Other specified noninflammatory disorders of vagina: Secondary | ICD-10-CM

## 2013-08-17 LAB — URINALYSIS W MICROSCOPIC + REFLEX CULTURE
Bilirubin Urine: NEGATIVE
CRYSTALS: NONE SEEN
Casts: NONE SEEN
Glucose, UA: NEGATIVE mg/dL
HGB URINE DIPSTICK: NEGATIVE
KETONES UR: NEGATIVE mg/dL
NITRITE: NEGATIVE
Protein, ur: NEGATIVE mg/dL
RBC / HPF: NONE SEEN RBC/hpf (ref <3)
UROBILINOGEN UA: 0.2 mg/dL (ref 0.0–1.0)
pH: 5.5 (ref 5.0–8.0)

## 2013-08-17 LAB — WET PREP FOR TRICH, YEAST, CLUE
CLUE CELLS WET PREP: NONE SEEN
Trich, Wet Prep: NONE SEEN
WBC WET PREP: NONE SEEN
Yeast Wet Prep HPF POC: NONE SEEN

## 2013-08-17 NOTE — Progress Notes (Signed)
   Patient presented to the office today stating that she has had a vaginal odor for the past few weeks. She stated she noted after urination. She denies any dysuria, or frequency. She is in a monogamous relationship. She is due for her annual exam in the next few months. She denies any vaginal discharge. Patient's menopausal on no hormone replacement therapy.  Exam: Bartholin's urethra Skene was within normal limits Vagina: No lesions or discharge Cervix: No lesions or discharge  Wet prep rare bacteria  Urinalysis 3-6 WBC with few bacteria  Nonspecific urinalysis will await the results of culture. Patient with minimal if any symptoms of urinary tract infection we'll wait for the culture before starting antibiotics. Patient reminded to schedule her annual exam in the next 2 months.

## 2013-08-19 LAB — URINE CULTURE

## 2013-09-06 ENCOUNTER — Encounter: Payer: Self-pay | Admitting: Gynecology

## 2013-09-28 ENCOUNTER — Other Ambulatory Visit (HOSPITAL_COMMUNITY)
Admission: RE | Admit: 2013-09-28 | Discharge: 2013-09-28 | Disposition: A | Discharge status: Home / Self Care | Payer: 59 | Source: Ambulatory Visit | Attending: Gynecology | Admitting: Gynecology

## 2013-09-28 ENCOUNTER — Encounter: Payer: Self-pay | Admitting: Gynecology

## 2013-09-28 ENCOUNTER — Ambulatory Visit (INDEPENDENT_AMBULATORY_CARE_PROVIDER_SITE_OTHER): Payer: 59 | Admitting: Gynecology

## 2013-09-28 VITALS — BP 130/82 | Ht 67.5 in | Wt 220.0 lb

## 2013-09-28 DIAGNOSIS — E8941 Symptomatic postprocedural ovarian failure: Secondary | ICD-10-CM

## 2013-09-28 DIAGNOSIS — Z01419 Encounter for gynecological examination (general) (routine) without abnormal findings: Secondary | ICD-10-CM | POA: Insufficient documentation

## 2013-09-28 DIAGNOSIS — Z87410 Personal history of cervical dysplasia: Secondary | ICD-10-CM

## 2013-09-28 DIAGNOSIS — E8940 Asymptomatic postprocedural ovarian failure: Secondary | ICD-10-CM | POA: Insufficient documentation

## 2013-09-28 HISTORY — DX: Asymptomatic postprocedural ovarian failure: E89.40

## 2013-09-28 NOTE — Progress Notes (Signed)
Patricia Delacruz 26-May-1956 [id number]   History:    58 y.o.  for annual gyn exam with no complaints today. Patient with history of surgical menopause. Patient 2013 had bilateral laparoscopic salpingo-oophorectomy along with resectoscopic polypectomy as a result of postmenopausal bleeding and her pathology report and demonstrated the following:  1. Ovary and fallopian tube, right  - BENIGN OVARIAN TISSUE WITH ASSOCIATED HEMORRHAGIC CYST, NO EVIDENCE OF ENDOMETRIOSIS, ATYPIA  OR MALIGNANCY.  - FALLOPIAN TUBAL TISSUE, NO PATHOLOGIC ABNORMALITIES.  2. Ovary and fallopian tube, left  - BENIGN OVARIAN TISSUE AND FALLOPIAN TUBAL TISSUE, NO PATHOLOGIC ABNORMALITIES.  3. Endometrial polyp  - BENIGN ENDOMETRIAL POLYP, NO ATYPIA, HYPERPLASIA OR MALIGNANCY.  The patient is doing well she is on no hormone replacement therapy denies any vasomotor symptoms.  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   The patient reports no vaginal bleeding. Colonoscopy 5 years ago patient stated that it was normal. Patient's PCP is Dr. Renaldo Fiddler. Patient has not had a baseline bone density study yet.   Past medical history,surgical history, family history and social history were all reviewed and documented in the EPIC chart.  Gynecologic History Patient's last menstrual period was 05/18/2010. Contraception: post menopausal status Last Pap: 2013. Results were: normal Last mammogram: 2013. Results were: normal  Obstetric History OB History  Gravida Para Term Preterm AB SAB TAB Ectopic Multiple Living  4 3   1 1    3     # Outcome Date GA Lbr Len/2nd Weight Sex Delivery Anes PTL Lv  4 SAB           3 PAR           2 PAR           1 PAR                ROS: A ROS was performed and pertinent positives and negatives are included in the history.  GENERAL: No fevers or chills. HEENT: No change in vision, no earache, sore throat or sinus congestion. NECK: No pain or  stiffness. CARDIOVASCULAR: No chest pain or pressure. No palpitations. PULMONARY: No shortness of breath, cough or wheeze. GASTROINTESTINAL: No abdominal pain, nausea, vomiting or diarrhea, melena or bright red blood per rectum. GENITOURINARY: No urinary frequency, urgency, hesitancy or dysuria. MUSCULOSKELETAL: No joint or muscle pain, no back pain, no recent trauma. DERMATOLOGIC: No rash, no itching, no lesions. ENDOCRINE: No polyuria, polydipsia, no heat or cold intolerance. No recent change in weight. HEMATOLOGICAL: No anemia or easy bruising or bleeding. NEUROLOGIC: No headache, seizures, numbness, tingling or weakness. PSYCHIATRIC: No depression, no loss of interest in normal activity or change in sleep pattern.     Exam: chaperone present  BP 130/82  Ht 5' 7.5" (1.715 m)  Wt 220 lb (99.791 kg)  BMI 33.93 kg/m2  LMP 05/18/2010  Body mass index is 33.93 kg/(m^2).  General appearance : Well developed well nourished female. No acute distress HEENT: Neck supple, trachea midline, no carotid bruits, no thyroidmegaly Lungs: Clear to auscultation, no rhonchi or wheezes, or rib retractions  Heart: Regular rate and rhythm, no murmurs or gallops Breast:Examined in sitting and supine position were symmetrical in appearance, no palpable masses or tenderness,  no skin retraction, no nipple inversion, no nipple discharge, no skin discoloration, no axillary or supraclavicular lymphadenopathy Abdomen: no palpable masses or tenderness, no rebound or guarding Extremities: no edema or skin discoloration or tenderness  Pelvic:  Bartholin, Urethra,  Skene Glands: Within normal limits             Vagina: No gross lesions or discharge  Cervix: No gross lesions or discharge  Uterus  anteverted, normal size, shape and consistency, non-tender and mobile  Adnexa  Without masses or tenderness  Anus and perineum  normal   Rectovaginal  normal sphincter tone without palpated masses or tenderness              Hemoccult cards provided     Assessment/Plan:  58 y.o. female for annual exam with surgical menopause who has no vasomotor symptoms. She is using a probiotic gel twice a week vaginally. Patient will schedule a bone density study here in the office in the next couple of weeks. She will also need her mammogram this year and a requisition was provided for her to call and schedule accordingly. She was reminded to do her monthly breast exam. We discussed the importance of calcium and vitamin D and regular exercise for osteoporosis prevention. She was provided with fecal Hemoccult cards for her to submit to the office for testing. Her PCP we'll be drawing her blood work in the next few weeks. 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  Note: This dictation was prepared with  Dragon/digital dictation along withSmart phrase technology. Any transcriptional errors that result from this process are unintentional.   Terrance Mass MD, 4:51 PM 09/28/2013

## 2013-09-28 NOTE — Patient Instructions (Addendum)
Shingles Vaccine What You Need to Know WHAT IS SHINGLES?  Shingles is a painful skin rash, often with blisters. It is also called Herpes Zoster or just Zoster.  A shingles rash usually appears on one side of the face or body and lasts from 2 to 4 weeks. Its main symptom is pain, which can be quite severe. Other symptoms of shingles can include fever, headache, chills, and upset stomach. Very rarely, a shingles infection can lead to pneumonia, hearing problems, blindness, brain inflammation (encephalitis), or death.  For about 1 person in 5, severe pain can continue even after the rash clears up. This is called post-herpetic neuralgia.  Shingles is caused by the Varicella Zoster virus. This is the same virus that causes chickenpox. Only someone who has had a case of chickenpox or rarely, has gotten chickenpox vaccine, can get shingles. The virus stays in your body. It can reappear many years later to cause a case of shingles.  You cannot catch shingles from another person with shingles. However, a person who has never had chickenpox (or chickenpox vaccine) could get chickenpox from someone with shingles. This is not very common.  Shingles is far more common in people 50 and older than in younger people. It is also more common in people whose immune systems are weakened because of a disease such as cancer or drugs such as steroids or chemotherapy.  At least 1 million people get shingles per year in the [place]. SHINGLES VACCINE  A vaccine for shingles was licensed in 2006. In clinical trials, the vaccine reduced the risk of shingles by 50%. It can also reduce the pain in people who still get shingles after being vaccinated.  A single dose of shingles vaccine is recommended for adults 60 years of age and older. SOME PEOPLE SHOULD NOT GET SHINGLES VACCINE OR SHOULD WAIT A person should not get shingles vaccine if he or she:  Has ever had a life-threatening allergic reaction to gelatin, the  antibiotic neomycin, or any other component of shingles vaccine. Tell your caregiver if you have any severe allergies.  Has a weakened immune system because of current:  AIDS or another disease that affects the immune system.  Treatment with drugs that affect the immune system, such as prolonged use of high-dose steroids.  Cancer treatment, such as radiation or chemotherapy.  Cancer affecting the bone marrow or lymphatic system, such as leukemia or lymphoma.  Is pregnant, or might be pregnant. Women should not become pregnant until at least 4 weeks after getting shingles vaccine. Someone with a minor illness, such as a cold, may be vaccinated. Anyone with a moderate or severe acute illness should usually wait until he or she recovers before getting the vaccine. This includes anyone with a temperature of 101.3 F (38 C) or higher. WHAT ARE THE RISKS FROM SHINGLES VACCINE?  A vaccine, like any medicine, could possibly cause serious problems, such as severe allergic reactions. However, the risk of a vaccine causing serious harm, or death, is extremely small.  No serious problems have been identified with shingles vaccine. Mild Problems  Redness, soreness, swelling, or itching at the site of the injection (about 1 person in 3).  Headache (about 1 person in 70). Like all vaccines, shingles vaccine is being closely monitored for unusual or severe problems. WHAT IF THERE IS A MODERATE OR SEVERE REACTION? What should I look for? Any unusual condition, such as a severe allergic reaction or a high fever. If a severe allergic reaction   occurred, it would be within a few minutes to an hour after the shot. Signs of a serious allergic reaction can include difficulty breathing, weakness, hoarseness or wheezing, a fast heartbeat, hives, dizziness, paleness, or swelling of the throat. What should I do?  Call your caregiver, or get the person to a caregiver right away.  Tell the caregiver what  happened, the date and time it happened, and when the vaccination was given.  Ask the caregiver to report the reaction by filing a Vaccine Adverse Event Reporting System (VAERS) form. Or, you can file this report through the VAERS web site at www.vaers.SamedayNews.es or by calling 503-114-5827. VAERS does not provide medical advice. HOW CAN I LEARN MORE?  Ask your caregiver. He or she can give you the vaccine package insert or suggest other sources of information.  Contact the Centers for Disease Control and Prevention (CDC):  Call 418-470-8443 (1-800-CDC-INFO).  Visit the CDC website at http://hunter.com/ CDC Shingles Vaccine VIS (03/26/08) Document Released: 04/04/2006 Document Revised: 08/30/2011 Document Reviewed: 09/27/2012 [facility] Patient Information 2014 [place]. Tetanus, Diphtheria (Td) Vaccine What You Need to Know WHY GET VACCINATED? Tetanus  and diphtheria are very serious diseases. They are rare in the [place] today, but people who do become infected often have severe complications. Td vaccine is used to protect adolescents and adults from both of these diseases. Both tetanus and diphtheria are infections caused by bacteria. Diphtheria spreads from person to person through coughing or sneezing. Tetanus-causing bacteria enter the body through cuts, scratches, or wounds. TETANUS (Lockjaw) causes painful muscle tightening and stiffness, usually all over the body.  It can lead to tightening of muscles in the head and neck so you can't open your mouth, swallow, or sometimes even breathe. Tetanus kills about 1 out of every 5 people who are infected. DIPHTHERIA can cause a thick coating to form in the back of the throat.  It can lead to breathing problems, paralysis, heart failure, and death. Before vaccines, the [place] saw as many as 200,000 cases a year of diphtheria and hundreds of cases of tetanus. Since vaccination began, cases of both diseases have dropped by  about 99%. TD VACCINE Td vaccine can protect adolescents and adults from tetanus and diphtheria. Td is usually given as a booster dose every 10 years but it can also be given earlier after a severe and dirty wound or burn. Your doctor can give you more information. Td may safely be given at the same time as other vaccines. SOME PEOPLE SHOULD NOT GET THIS VACCINE  If you ever had a life-threatening allergic reaction after a dose of any tetanus or diphtheria containing vaccine, OR if you have a severe allergy to any part of this vaccine, you should not get Td. Tell your doctor if you have any severe allergies.  Talk to your doctor if you:  have epilepsy or another nervous system problem,  had severe pain or swelling after any vaccine containing diphtheria or tetanus,  ever had Guillain Barr Syndrome (GBS),  aren't feeling well on the day the shot is scheduled. RISKS OF A VACCINE REACTION With a vaccine, like any medicine, there is a chance of side effects. These are usually mild and go away on their own. Serious side effects are also possible, but are very rare. Most people who get Td vaccine do not have any problems with it. Mild Problems  following Td (Did not interfere with activities)  Pain where the shot was given (about 8 people  in 10)  Redness or swelling where the shot was given (about 1 person in 3)  Mild fever (about 1 person in 15)  Headache or Tiredness (uncommon) Moderate Problems following Td (Interfered with activities, but did not require medical attention)  Fever over 102 F (38.9 C) (rare) Severe Problems  following Td (Unable to perform usual activities; required medical attention)  Swelling, severe pain, bleeding, or redness in the arm where the shot was given (rare). Problems that could happen after any vaccine:  Brief fainting spells can happen after any medical procedure, including vaccination. Sitting or lying down for about 15 minutes can help  prevent fainting, and injuries caused by a fall. Tell your doctor if you feel dizzy, or have vision changes or ringing in the ears.  Severe shoulder pain and reduced range of motion in the arm where a shot was given can happen, very rarely, after a vaccination.  Severe allergic reactions from a vaccine are very rare, estimated at less than 1 in a million doses. If one were to occur, it would usually be within a few minutes to a few hours after the vaccination. WHAT IF THERE IS A SERIOUS REACTION? What should I look for?  Look for anything that concerns you, such as signs of a severe allergic reaction, very high fever, or behavior changes. Signs of a severe allergic reaction can include hives, swelling of the face and throat, difficulty breathing, a fast heartbeat, dizziness, and weakness. These would usually start a few minutes to a few hours after the vaccination. What should I do?  If you think it is a severe allergic reaction or other emergency that can't wait, call 911 or get the person to the nearest hospital. Otherwise, call your doctor.  Afterward, the reaction should be reported to the Vaccine Adverse Event Reporting System (VAERS). Your doctor might file this report, or, you can do it yourself through the VAERS website or by calling 312-545-6059. VAERS is only for reporting reactions. They do not give medical advice. THE NATIONAL VACCINE INJURY COMPENSATION PROGRAM The National Vaccine Injury Compensation Program (VICP) is a federal program that was created to compensate people who may have been injured by certain vaccines. Persons who believe they may have been injured by a vaccine can learn about the program and about filing a claim by calling 786-452-2356 or visiting the [facility] website. HOW CAN I LEARN MORE?  Ask your doctor.  Contact your local or state health department.  Contact the Centers for Disease Control and Prevention (CDC):  Call (346)216-8326  (1-800-CDC-INFO)  Visit CDC's vaccines website CDC Td Vaccine Interim VIS (07/25/12) Document Released: 04/04/2006 Document Revised: 10/02/2012 Document Reviewed: 09/27/2012 [facility] Patient Information 2014 [place], [place]. Bone Densitometry Bone densitometry is a special X-ray that measures your bone density and can be used to help predict your risk of bone fractures. This test is used to determine bone mineral content and density to diagnose osteoporosis. Osteoporosis is the loss of bone that may cause the bone to become weak. Osteoporosis commonly occurs in women entering menopause. However, it may be found in men and in people with other diseases. PREPARATION FOR TEST No preparation necessary. WHO SHOULD BE TESTED?  All women older than 72.  Postmenopausal women (50 to 3) with risk factors for osteoporosis.  People with a previous fracture caused by normal activities.  People with a small body frame (less than 127 poundsor a body mass index [BMI] of less than 21).  People who have a  parent with a hip fracture or history of osteoporosis.  People who smoke.  People who have rheumatoid arthritis.  Anyone who engages in excessive alcohol use (more than 3 drinks most days).  Women who experience early menopause. WHEN SHOULD YOU BE RETESTED? Current guidelines suggest that you should wait at least 2 years before doing a bone density test again if your first test was normal.Recent studies indicated that women with normal bone density may be able to wait a few years before needing to repeat a bone density test. You should discuss this with your caregiver.  NORMAL FINDINGS   Normal: less than standard deviation below normal (greater than -1).  Osteopenia: 1 to 2.5 standard deviations below normal (-1 to -2.5).  Osteoporosis: greater than 2.5 standard deviations below normal (less than -2.5). Test results are reported as a "T score" and a "Z score."The T score is a number that  compares your bone density with the bone density of healthy, young women.The Z score is a number that compares your bone density with the scores of women who are the same age, gender, and race.  Ranges for normal findings may vary among different laboratories and hospitals. You should always check with your doctor after having lab work or other tests done to discuss the meaning of your test results and whether your values are considered within normal limits. MEANING OF TEST  Your caregiver will go over the test results with you and discuss the importance and meaning of your results, as well as treatment options and the need for additional tests if necessary. OBTAINING THE TEST RESULTS It is your responsibility to obtain your test results. Ask the lab or department performing the test when and how you will get your results. Document Released: 06/29/2004 Document Revised: 08/30/2011 Document Reviewed: 07/22/2010 [facility] Patient Information 2014 [place].

## 2013-11-02 ENCOUNTER — Other Ambulatory Visit: Payer: Self-pay

## 2013-11-02 DIAGNOSIS — Z1231 Encounter for screening mammogram for malignant neoplasm of breast: Secondary | ICD-10-CM

## 2013-11-13 ENCOUNTER — Encounter (INDEPENDENT_AMBULATORY_CARE_PROVIDER_SITE_OTHER): Payer: Self-pay

## 2013-11-13 ENCOUNTER — Ambulatory Visit
Admission: RE | Admit: 2013-11-13 | Discharge: 2013-11-13 | Disposition: A | Discharge status: Home / Self Care | Payer: 59 | Source: Ambulatory Visit

## 2013-11-13 DIAGNOSIS — Z1231 Encounter for screening mammogram for malignant neoplasm of breast: Secondary | ICD-10-CM

## 2013-12-16 IMAGING — CR DG CHEST 2V
2 series · 2 of 2 positions shown · non-contrast
Comparison: Radiographs 06/04/2010.

CLINICAL DATA: Preoperative respiratory examination for pelvic
mass.  History of hypertension, diabetes.

CHEST - 2 VIEW

[w chest pa]
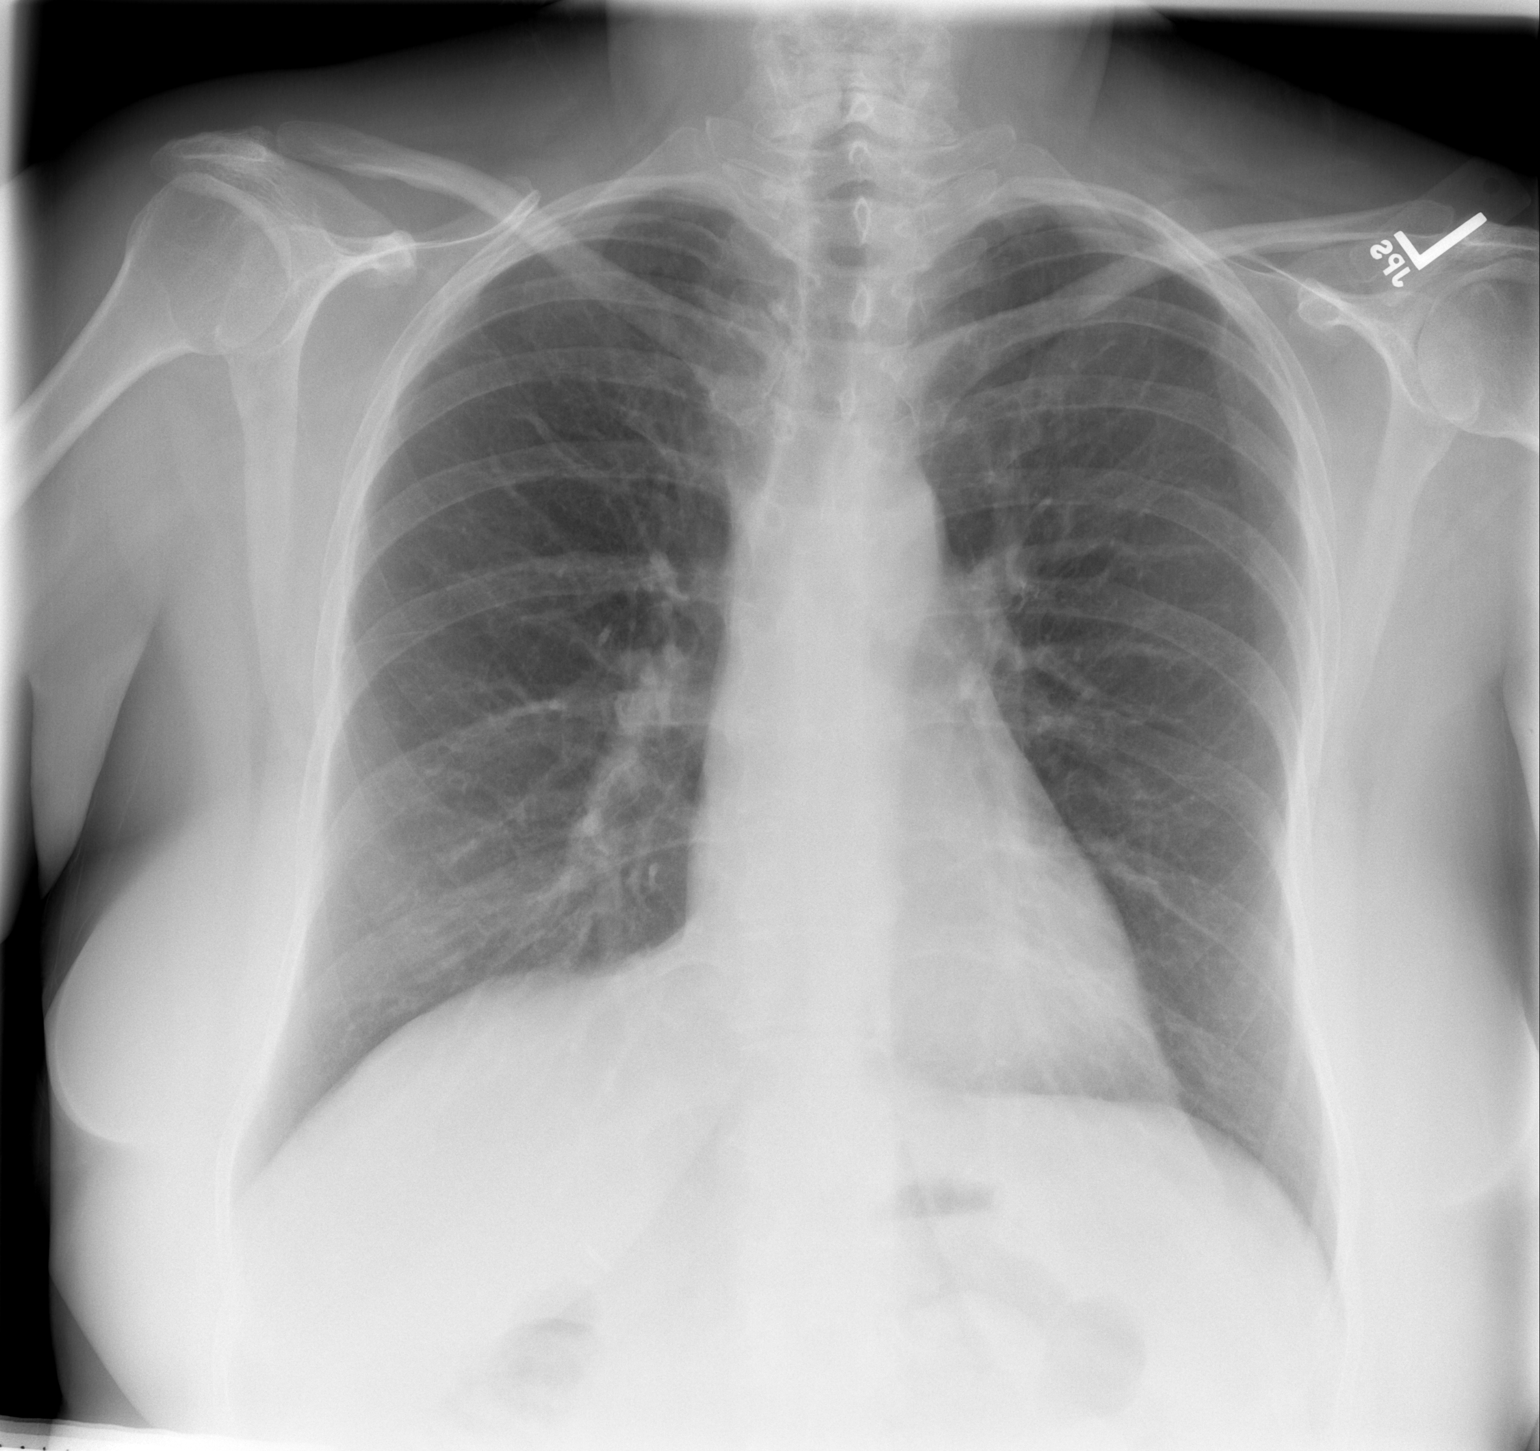

[w chest lat]
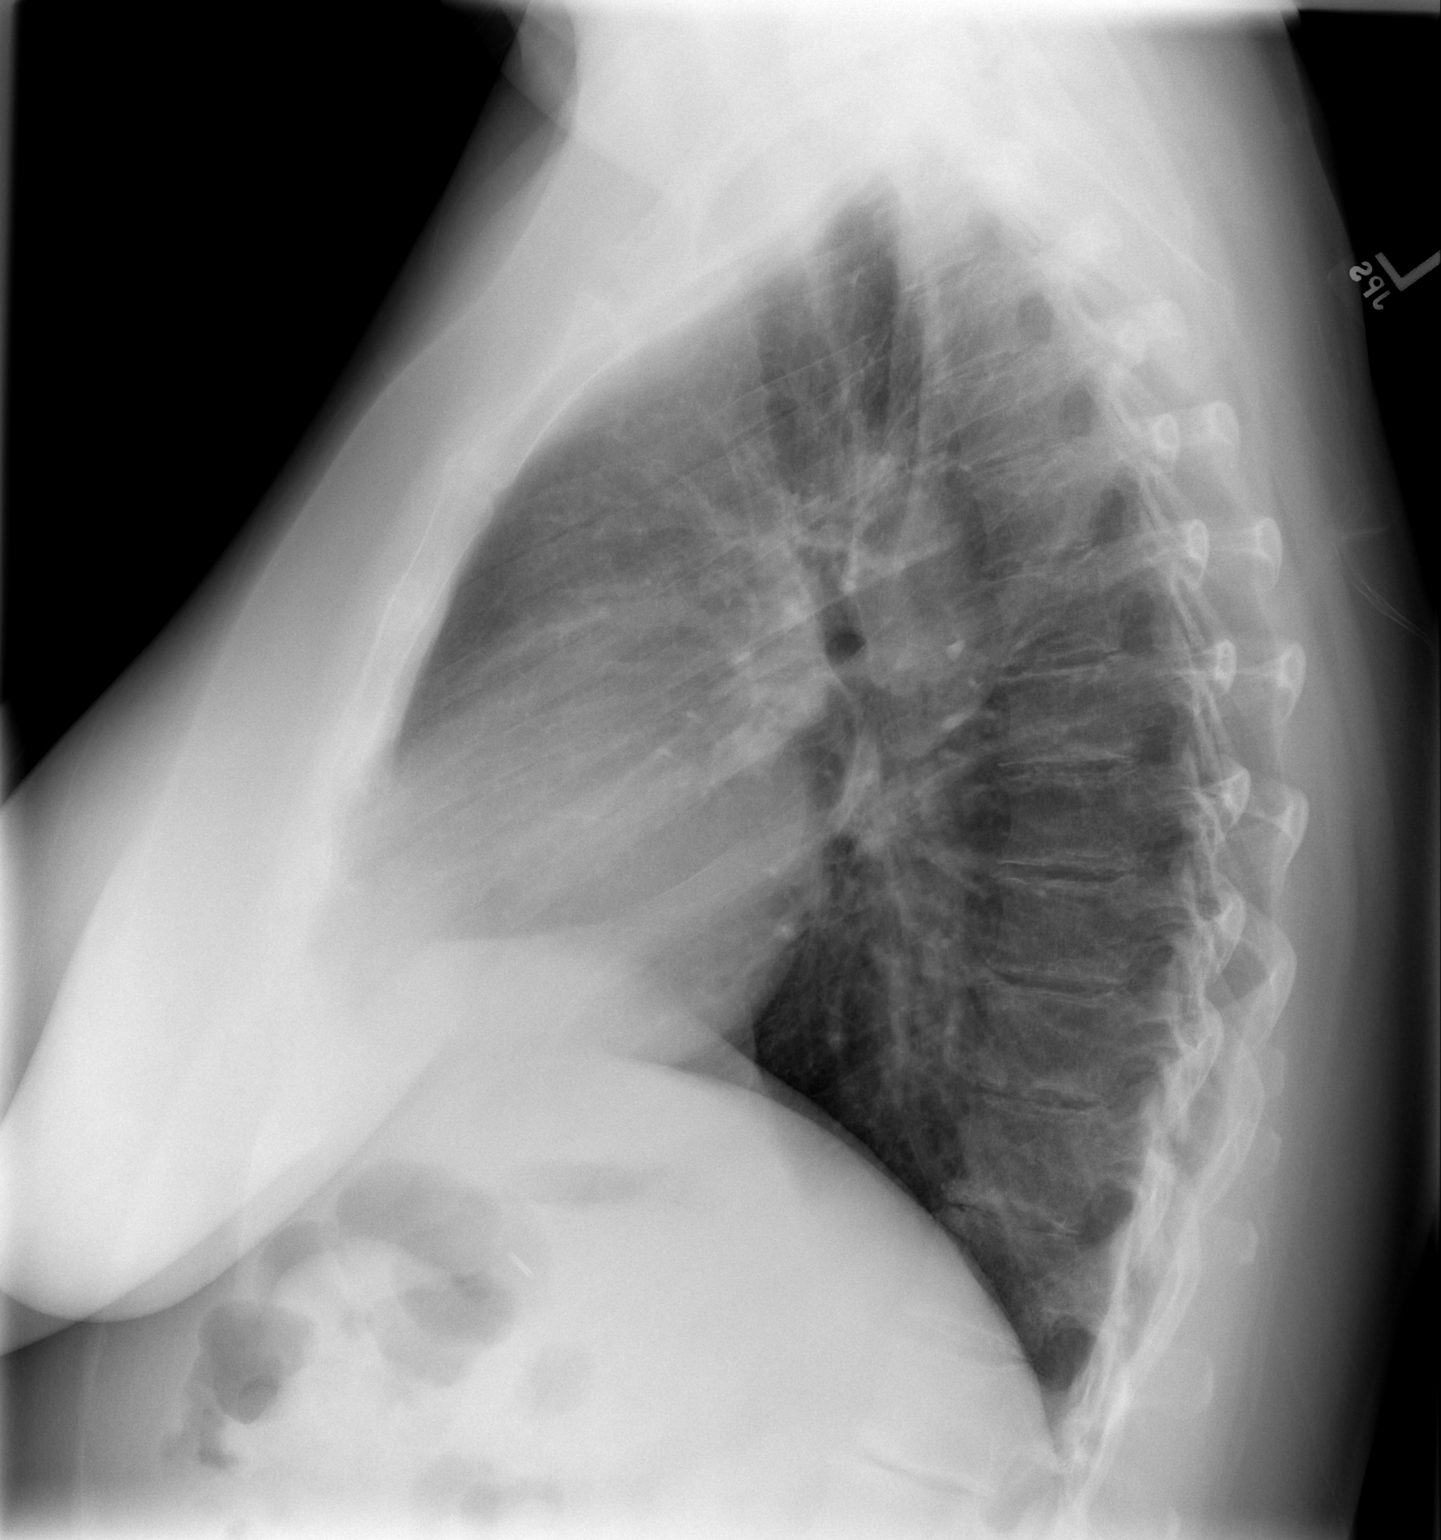

[2 of 2 positions shown; findings below may reference images not displayed]

FINDINGS: The heart size and mediastinal contours are normal. The
lungs are clear. There is no pleural effusion or pneumothorax. No
acute osseous findings are identified.  Thoracic spine degenerative
changes and cholecystectomy clips are noted.
IMPRESSION: No active cardiopulmonary process.

## 2014-04-22 ENCOUNTER — Encounter: Payer: Self-pay | Admitting: Gynecology

## 2014-07-02 ENCOUNTER — Other Ambulatory Visit: Payer: Self-pay | Admitting: Internal Medicine

## 2014-07-02 ENCOUNTER — Ambulatory Visit
Admission: RE | Admit: 2014-07-02 | Discharge: 2014-07-02 | Disposition: A | Discharge status: Home / Self Care | Payer: 59 | Source: Ambulatory Visit | Attending: Internal Medicine | Admitting: Internal Medicine

## 2014-07-02 DIAGNOSIS — M545 Low back pain, unspecified: Secondary | ICD-10-CM

## 2014-07-02 DIAGNOSIS — M79605 Pain in left leg: Principal | ICD-10-CM

## 2014-07-08 ENCOUNTER — Telehealth: Payer: Self-pay | Admitting: *Deleted

## 2014-07-08 MED ORDER — CLOBETASOL PROPIONATE 0.05 % EX CREA: TOPICAL_CREAM | CUTANEOUS | Status: DC

## 2014-07-08 NOTE — Telephone Encounter (Signed)
Pt called c/o what appears to be a rash under left breast with slight odor, she asked if you could recommend some powder or cream to use? Pt said she just noticed this am.  Please advise

## 2014-07-08 NOTE — Telephone Encounter (Signed)
Rx sent, left on voicemail Rx has been sent. 

## 2014-07-08 NOTE — Telephone Encounter (Signed)
You can call her in a prescription for clobetasol 0.05% to apply a very thin film twice a day for 7 days. If no improvement  I  need to see her in the office

## 2014-12-02 ENCOUNTER — Other Ambulatory Visit: Payer: Self-pay | Admitting: Internal Medicine

## 2014-12-02 DIAGNOSIS — R0989 Other specified symptoms and signs involving the circulatory and respiratory systems: Secondary | ICD-10-CM

## 2014-12-04 ENCOUNTER — Ambulatory Visit
Admission: RE | Admit: 2014-12-04 | Discharge: 2014-12-04 | Disposition: A | Discharge status: Home / Self Care | Payer: 59 | Source: Ambulatory Visit | Attending: Internal Medicine | Admitting: Internal Medicine

## 2014-12-04 DIAGNOSIS — R0989 Other specified symptoms and signs involving the circulatory and respiratory systems: Secondary | ICD-10-CM

## 2016-05-11 ENCOUNTER — Other Ambulatory Visit: Payer: Self-pay | Admitting: Internal Medicine

## 2016-05-11 DIAGNOSIS — Z1231 Encounter for screening mammogram for malignant neoplasm of breast: Secondary | ICD-10-CM

## 2016-06-04 ENCOUNTER — Ambulatory Visit
Admission: RE | Admit: 2016-06-04 | Discharge: 2016-06-04 | Disposition: A | Discharge status: Home / Self Care | Payer: 59 | Source: Ambulatory Visit | Attending: Internal Medicine | Admitting: Internal Medicine

## 2016-06-04 DIAGNOSIS — Z1231 Encounter for screening mammogram for malignant neoplasm of breast: Secondary | ICD-10-CM

## 2016-06-07 ENCOUNTER — Other Ambulatory Visit: Payer: Self-pay | Admitting: Internal Medicine

## 2016-06-07 DIAGNOSIS — R928 Other abnormal and inconclusive findings on diagnostic imaging of breast: Secondary | ICD-10-CM

## 2016-06-16 ENCOUNTER — Ambulatory Visit
Admission: RE | Admit: 2016-06-16 | Discharge: 2016-06-16 | Disposition: A | Discharge status: Home / Self Care | Payer: 59 | Source: Ambulatory Visit | Attending: Internal Medicine | Admitting: Internal Medicine

## 2016-06-16 DIAGNOSIS — R928 Other abnormal and inconclusive findings on diagnostic imaging of breast: Secondary | ICD-10-CM

## 2016-08-19 ENCOUNTER — Ambulatory Visit (INDEPENDENT_AMBULATORY_CARE_PROVIDER_SITE_OTHER): Payer: Self-pay | Admitting: Orthopedic Surgery

## 2016-08-23 ENCOUNTER — Other Ambulatory Visit (INDEPENDENT_AMBULATORY_CARE_PROVIDER_SITE_OTHER): Payer: Self-pay | Admitting: Orthopaedic Surgery

## 2016-08-23 ENCOUNTER — Encounter (INDEPENDENT_AMBULATORY_CARE_PROVIDER_SITE_OTHER): Payer: Self-pay | Admitting: Orthopaedic Surgery

## 2016-08-23 ENCOUNTER — Ambulatory Visit (INDEPENDENT_AMBULATORY_CARE_PROVIDER_SITE_OTHER): Payer: 59

## 2016-08-23 ENCOUNTER — Ambulatory Visit (INDEPENDENT_AMBULATORY_CARE_PROVIDER_SITE_OTHER): Payer: Self-pay | Admitting: Orthopaedic Surgery

## 2016-08-23 ENCOUNTER — Encounter (INDEPENDENT_AMBULATORY_CARE_PROVIDER_SITE_OTHER): Payer: Self-pay

## 2016-08-23 ENCOUNTER — Ambulatory Visit (INDEPENDENT_AMBULATORY_CARE_PROVIDER_SITE_OTHER): Payer: 59 | Admitting: Orthopaedic Surgery

## 2016-08-23 DIAGNOSIS — M25532 Pain in left wrist: Secondary | ICD-10-CM | POA: Diagnosis not present

## 2016-08-23 MED ORDER — MELOXICAM 7.5 MG PO TABS: 7.5000 mg | ORAL_TABLET | Freq: Two times a day (BID) | ORAL | 2 refills | Status: DC | PRN

## 2016-08-23 NOTE — Telephone Encounter (Signed)
Patient was seen today and dent rx to pharm

## 2016-08-23 NOTE — Progress Notes (Signed)
Office Visit Note   Patient: Patricia Delacruz           Date of Birth: [birth year]           MRN: [id number] Visit Date: 08/23/2016              Requested by: Jilda Panda, MD [id number] [place], [place] [address] PCP: Jilda Panda, MD   Assessment & Plan: Visit Diagnoses:  1. Pain in left wrist     Plan: I think she is having wrist tendinitis possibly ECRL and EPL tendons. I recommend immobilization with a wrist brace for 2 weeks. She requests out of work for 2 weeks to give it rest. Meloxicam was prescribed. I'll see her back as needed.  Follow-Up Instructions: Return if symptoms worsen or fail to improve.   Orders:  Orders Placed This Encounter  Procedures  . XR Wrist Complete Left   Meds ordered this encounter  Medications  . meloxicam (MOBIC) 7.5 MG tablet    Sig: Take 1 tablet (7.5 mg total) by mouth 2 (two) times daily as needed for pain.    Dispense:  30 tablet    Refill:  2      Procedures: No procedures performed   Clinical Data: No additional findings.   Subjective: Chief Complaint  Patient presents with  . Left Wrist - Pain    Patient comes in today with left wrist pain over the radial dorsal aspect overlying the EPL tendon roughly. 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 She did have some swelling but this is resolved. Pain does not radiate up the arm. It does radiate down into the index and thumb. She denies any burning pain or any weakness in grip. Denies any constitutional symptoms    Review of Systems  Constitutional: Negative.   HENT: Negative.   Eyes: Negative.   Respiratory: Negative.   Cardiovascular: Negative.   Endocrine: Negative.   Musculoskeletal: Negative.   Neurological: Negative.   Hematological: Negative.   Psychiatric/Behavioral: Negative.   All other systems reviewed and are negative.    Objective: Vital Signs: LMP 05/18/2010   Physical Exam  Constitutional: She is oriented to person, place, and time. She appears well-developed and  well-nourished.  HENT:  Head: Normocephalic and atraumatic.  Eyes: EOM are normal.  Neck: Neck supple.  Pulmonary/Chest: Effort normal.  Abdominal: Soft.  Neurological: She is alert and oriented to person, place, and time.  Skin: Skin is warm. Capillary refill takes less than 2 seconds.  Psychiatric: She has a normal mood and affect. Her behavior is normal. Judgment and thought content normal.  Nursing note and vitals reviewed.   Ortho Exam Exam of the left wrist and hand shows no swelling. She has mild vague tenderness over the EPL tendon. Some discomfort with palpation over the wrist extensors. She has no carpal tunnel signs. Specialty Comments:  No specialty comments available.  Imaging: No results found.   PMFS History: Patient Active Problem List   Diagnosis Date Noted  . 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 Condyloma acuminatum of vulva 01/28/2012  . 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 Depression 01/28/2012  . Anxiety 01/28/2012   Past Medical History:  Diagnosis Date  . Anxiety   . CIN II (cervical intraepithelial neoplasia II)   . Complication of anesthesia    pt feels like "I can't breathe after surgery"  . Condyloma acuminata    VULVAR  . Depression   . Diabetes mellitus  oral ACTOS  . Headache(784.0)    occas  . High risk HPV infection   . Hypertension     Family History  Problem Relation Age of Onset  . Cancer Mother     LYMPHOMA  . Diabetes Father   . Hypertension Father     Past Surgical History:  Procedure Laterality Date  . CESAREAN SECTION  1990  . CHOLECYSTECTOMY  2011  . CO 2 LASER      CERVIX /VAGINA  . COLONOSCOPY  NOV/03/2008   NEXT IN 10 YEARS  . LAPAROSCOPY  03/13/2012   Procedure: LAPAROSCOPY OPERATIVE;  Surgeon: Terrance Mass, MD;  Location: [facility];  Service: Gynecology;  Laterality: N/A;  . SALPINGOOPHORECTOMY  03/13/2012   Procedure: SALPINGO OOPHERECTOMY;  Surgeon: Terrance Mass, MD;  Location: [facility];  Service: Gynecology;  Laterality: Bilateral;   Social History   Occupational History  . Not on file.   Social History Main Topics  . Smoking status: Never Smoker  . Smokeless tobacco: Never Used  . Alcohol use Yes     Comment: occ  . Drug use: No  . Sexual activity: Yes

## 2016-08-27 ENCOUNTER — Ambulatory Visit (INDEPENDENT_AMBULATORY_CARE_PROVIDER_SITE_OTHER): Payer: Self-pay | Admitting: Orthopaedic Surgery

## 2016-08-31 ENCOUNTER — Other Ambulatory Visit (INDEPENDENT_AMBULATORY_CARE_PROVIDER_SITE_OTHER): Payer: Self-pay

## 2016-08-31 ENCOUNTER — Telehealth (INDEPENDENT_AMBULATORY_CARE_PROVIDER_SITE_OTHER): Payer: Self-pay | Admitting: *Deleted

## 2016-08-31 DIAGNOSIS — M25532 Pain in left wrist: Secondary | ICD-10-CM

## 2016-08-31 NOTE — Telephone Encounter (Signed)
Patient called in this morning in regards to wanting to want to know what to do for her wrist? It is not getting any better and is still very swollen. Her CB # [phone]. Thank you

## 2016-08-31 NOTE — Telephone Encounter (Signed)
Called pt to let her know MRI was made

## 2016-08-31 NOTE — Telephone Encounter (Signed)
Please advise 

## 2016-08-31 NOTE — Telephone Encounter (Signed)
MRI wrist r/o contrast.  Eval chronic wrist pain and swelling.

## 2016-09-03 ENCOUNTER — Telehealth (INDEPENDENT_AMBULATORY_CARE_PROVIDER_SITE_OTHER): Payer: Self-pay | Admitting: Orthopaedic Surgery

## 2016-09-03 NOTE — Telephone Encounter (Signed)
PT [facility] ADJUSTER REQUESTING NOTES AND PT WORK STATUS FAXED   [place] [phone]

## 2016-09-06 NOTE — Telephone Encounter (Signed)
faxed

## 2016-10-06 ENCOUNTER — Telehealth (INDEPENDENT_AMBULATORY_CARE_PROVIDER_SITE_OTHER): Payer: Self-pay | Admitting: *Deleted

## 2016-10-06 NOTE — Telephone Encounter (Signed)
s/w pt advising d/t insurance denial of mri pt is needing to have a 6 week f/u and then can try again, pt just informed me that [facility] sent her to [place] and has been receiving treatment there. Pt is no longer wanting to proceed with MRI from [place]. MRI has been cancelled.

## 2016-11-03 ENCOUNTER — Encounter: Payer: Self-pay | Admitting: Gynecology

## 2017-12-10 ENCOUNTER — Other Ambulatory Visit: Payer: Self-pay

## 2017-12-10 ENCOUNTER — Encounter (HOSPITAL_BASED_OUTPATIENT_CLINIC_OR_DEPARTMENT_OTHER): Payer: Self-pay

## 2017-12-10 ENCOUNTER — Emergency Department (HOSPITAL_BASED_OUTPATIENT_CLINIC_OR_DEPARTMENT_OTHER)
Admission: EM | Admit: 2017-12-10 | Discharge: 2017-12-11 | Disposition: A | Discharge status: Home / Self Care | Payer: 59 | Attending: Emergency Medicine | Admitting: Emergency Medicine

## 2017-12-10 DIAGNOSIS — N23 Unspecified renal colic: Secondary | ICD-10-CM | POA: Insufficient documentation

## 2017-12-10 DIAGNOSIS — E119 Type 2 diabetes mellitus without complications: Secondary | ICD-10-CM | POA: Diagnosis not present

## 2017-12-10 DIAGNOSIS — I10 Essential (primary) hypertension: Secondary | ICD-10-CM | POA: Diagnosis not present

## 2017-12-10 DIAGNOSIS — Z79899 Other long term (current) drug therapy: Secondary | ICD-10-CM | POA: Diagnosis not present

## 2017-12-10 DIAGNOSIS — R1032 Left lower quadrant pain: Secondary | ICD-10-CM | POA: Diagnosis present

## 2017-12-10 LAB — URINALYSIS, ROUTINE W REFLEX MICROSCOPIC
Glucose, UA: NEGATIVE mg/dL
Ketones, ur: 15 mg/dL — AB
NITRITE: NEGATIVE
PH: 5.5 (ref 5.0–8.0)
Protein, ur: 100 mg/dL — AB

## 2017-12-10 LAB — CBC
HEMATOCRIT: 42.7 % (ref 36.0–46.0)
HEMOGLOBIN: 13.7 g/dL (ref 12.0–15.0)
MCH: 27.5 pg (ref 26.0–34.0)
MCHC: 32.1 g/dL (ref 30.0–36.0)
MCV: 85.6 fL (ref 78.0–100.0)
Platelets: 186 10*3/uL (ref 150–400)
RBC: 4.99 MIL/uL (ref 3.87–5.11)
RDW: 16.5 % — ABNORMAL HIGH (ref 11.5–15.5)
WBC: 10.4 10*3/uL (ref 4.0–10.5)

## 2017-12-10 LAB — COMPREHENSIVE METABOLIC PANEL
ALBUMIN: 4 g/dL (ref 3.5–5.0)
ALT: 19 U/L (ref 14–54)
ANION GAP: 8 (ref 5–15)
AST: 21 U/L (ref 15–41)
Alkaline Phosphatase: 85 U/L (ref 38–126)
BUN: 30 mg/dL — ABNORMAL HIGH (ref 6–20)
CO2: 28 mmol/L (ref 22–32)
Calcium: 9.2 mg/dL (ref 8.9–10.3)
Chloride: 106 mmol/L (ref 101–111)
Creatinine, Ser: 0.99 mg/dL (ref 0.44–1.00)
GFR calc non Af Amer: >60 mL/min (ref >60)
GLUCOSE: 163 mg/dL — AB (ref 65–99)
POTASSIUM: 3.9 mmol/L (ref 3.5–5.1)
SODIUM: 142 mmol/L (ref 135–145)
TOTAL PROTEIN: 7.5 g/dL (ref 6.5–8.1)
Total Bilirubin: 0.3 mg/dL (ref 0.3–1.2)

## 2017-12-10 LAB — URINALYSIS, MICROSCOPIC (REFLEX): RBC / HPF: >50 RBC/hpf (ref 0–5)

## 2017-12-10 LAB — TROPONIN I

## 2017-12-10 LAB — LIPASE, BLOOD: LIPASE: 40 U/L (ref 11–51)

## 2017-12-10 LAB — PREGNANCY, URINE: PREG TEST UR: NEGATIVE

## 2017-12-10 NOTE — ED Triage Notes (Signed)
Pt c/o abd pain pain x 1 hour ago that radiates to lower back. Pt reports that it is hard to get a full breath. Pt also endorses nausea.

## 2017-12-10 NOTE — ED Notes (Signed)
Pt was recently diagnosed and treated for UTI. Tonight, became nauseated after eating. C/O left flank tenderness radiating down left leg. Hx bone spurs as well and was told to go on probiotics about a year ago for "stomach feeling hard". Taking everyday, but still feels hard. Urine cloudy today and "brown". "All red" 2 weeks ago. Denies discharge.

## 2017-12-11 ENCOUNTER — Emergency Department (HOSPITAL_BASED_OUTPATIENT_CLINIC_OR_DEPARTMENT_OTHER): Payer: 59

## 2017-12-11 MED ORDER — ONDANSETRON 8 MG PO TBDP: 8.0000 mg | ORAL_TABLET | Freq: Three times a day (TID) | ORAL | 0 refills | Status: DC | PRN

## 2017-12-11 MED ORDER — ONDANSETRON HCL 4 MG/2ML IJ SOLN
4.0000 mg | Freq: Once | INTRAMUSCULAR | Status: AC
  Administered 2017-12-11: 4 mg via INTRAVENOUS
  Filled 2017-12-11: qty 2

## 2017-12-11 MED ORDER — FENTANYL CITRATE (PF) 100 MCG/2ML IJ SOLN
100.0000 ug | Freq: Once | INTRAMUSCULAR | Status: AC
  Administered 2017-12-11: 100 ug via INTRAVENOUS
  Filled 2017-12-11: qty 2

## 2017-12-11 MED ORDER — HYDROMORPHONE HCL 2 MG PO TABS: 2.0000 mg | ORAL_TABLET | ORAL | 0 refills | Status: DC | PRN

## 2017-12-11 NOTE — ED Notes (Signed)
Pt given d/c instructions as per chart. Rx x 2. Verbalizes understanding. No questions. 

## 2017-12-11 NOTE — ED Notes (Signed)
Patient transported to CT 

## 2017-12-11 NOTE — ED Provider Notes (Signed)
[facility] Provider Note: Georgena Spurling, MD, FACEP  CSN: [id number] MRN: [id number] ARRIVAL: 12/10/17 at 2041 ROOM: MH07/MH07   CHIEF COMPLAINT  Abdominal Pain   HISTORY OF PRESENT ILLNESS  12/11/17 1:20 AM Patricia Delacruz is a 62 y.o. female who had the onset of left lower quadrant pain radiating to her left flank about an hour prior to arrival.  She rates her pain as a 9 out of 10 at its worst and a 5 out of 10 presently.  She describes it as throbbing.  It does not change significantly with movement or palpation.  She had nausea which preceded the pain and is still present.  She has not vomited.  She noticed her urine was darker than usual.  He was recently treated for a urinary tract infection with Bactrim followed by a penicillin.  She was treated for 10 days.   Past Medical History:  Diagnosis Date  . Anxiety   . CIN II (cervical intraepithelial neoplasia II)   . Complication of anesthesia    pt feels like "I can't breathe after surgery"  . Condyloma acuminata    VULVAR  . Depression   . Diabetes mellitus    oral ACTOS  . Headache(784.0)    occas  . High risk HPV infection   . Hypertension     Past Surgical History:  Procedure Laterality Date  . CESAREAN SECTION  1990  . CHOLECYSTECTOMY  2011  . CO 2 LASER      CERVIX /VAGINA  . COLONOSCOPY  NOV/03/2008   NEXT IN 10 YEARS  . LAPAROSCOPY  03/13/2012   Procedure: LAPAROSCOPY OPERATIVE;  Surgeon: Terrance Mass, MD;  Location: [facility];  Service: Gynecology;  Laterality: N/A;  . SALPINGOOPHORECTOMY  03/13/2012   Procedure: SALPINGO OOPHERECTOMY;  Surgeon: Terrance Mass, MD;  Location: [facility];  Service: Gynecology;  Laterality: Bilateral;    Family History  Problem Relation Age of Onset  . Cancer Mother        LYMPHOMA  . Diabetes Father   . Hypertension Father     Social History   Tobacco Use  . Smoking status: Never Smoker  . Smokeless tobacco: Never Used  Substance Use Topics  . Alcohol  use: Yes    Comment: occ  . Drug use: No    Prior to Admission medications   Medication Sig Start Date End Date Taking? Authorizing Provider  buPROPion (WELLBUTRIN XL) 300 MG 24 hr tablet Take 300 mg by mouth daily.    [provider]  citalopram (CELEXA) 10 MG tablet Take 10 mg by mouth daily.    [provider]  clindamycin (CLEOCIN) 2 % vaginal cream Place 1 Applicatorful vaginally at bedtime. Patient not taking: Reported on 08/23/2016 03/27/12   Terrance Mass, MD  clobetasol cream (TEMOVATE) 0.05 % Apply a very thin film twice a day for 7 days Patient not taking: Reported on 08/23/2016 07/08/14   Terrance Mass, MD  HYDROcodone-acetaminophen (VICODIN) 2.5-500 MG per tablet Take 1 tablet by mouth every 6 (six) hours as needed for pain. Patient not taking: Reported on 08/23/2016 03/13/12   Terrance Mass, MD  meloxicam (MOBIC) 7.5 MG tablet Take 1 tablet (7.5 mg total) by mouth 2 (two) times daily as needed for pain. 08/23/16   Leandrew Koyanagi, MD  pioglitazone (ACTOS) 30 MG tablet Take 30 mg by mouth daily.    [provider]  ramipril (ALTACE) 5 MG tablet Take 5 mg  by mouth daily.    [provider]  zolpidem (AMBIEN) 5 MG tablet Take 5 mg by mouth at bedtime as needed for sleep.    [provider]    Allergies Tetracyclines & related   REVIEW OF SYSTEMS  Negative except as noted here or in the History of Present Illness.   PHYSICAL EXAMINATION  Initial Vital Signs Blood pressure (!) 145/74, pulse 78, temperature 97.7 F (36.5 C), temperature source Oral, resp. rate 20, height 5\' 8"  (1.727 m), weight 104.3 kg (230 lb), last menstrual period 05/18/2010, SpO2 100 %.  Examination General: Well-developed, well-nourished female in no acute distress; appearance consistent with age of record HENT: normocephalic; atraumatic Eyes: pupils equal, round and reactive to light; extraocular muscles intact Neck: supple Heart: regular rate and  rhythm Lungs: clear to auscultation bilaterally Abdomen: soft; nondistended; nontender; bowel sounds present GU: No CVA tenderness Extremities: No deformity; full range of motion; pulses normal Neurologic: Awake, alert and oriented; motor function intact in all extremities and symmetric; no facial droop Skin: Warm and dry Psychiatric: Normal mood and affect   RESULTS  Summary of this visit's results, reviewed by myself:   EKG Interpretation  Date/Time:    Ventricular Rate:    PR Interval:    QRS Duration:   QT Interval:    QTC Calculation:   R Axis:     Text Interpretation:        Laboratory Studies: Results for orders placed or performed during the hospital encounter of 12/10/17 (from the past 24 hour(s))  Urinalysis, Routine w reflex microscopic     Status: Abnormal   Collection Time: 12/10/17 10:03 PM  Result Value Ref Range   Color, Urine AMBER (A) YELLOW   APPearance CLOUDY (A) CLEAR   Specific Gravity, Urine >1.030 (H) 1.005 - 1.030   pH 5.5 5.0 - 8.0   Glucose, UA NEGATIVE NEGATIVE mg/dL   Hgb urine dipstick LARGE (A) NEGATIVE   Bilirubin Urine SMALL (A) NEGATIVE   Ketones, ur 15 (A) NEGATIVE mg/dL   Protein, ur 100 (A) NEGATIVE mg/dL   Nitrite NEGATIVE NEGATIVE   Leukocytes, UA TRACE (A) NEGATIVE  Pregnancy, urine     Status: None   Collection Time: 12/10/17 10:03 PM  Result Value Ref Range   Preg Test, Ur NEGATIVE NEGATIVE  Urinalysis, Microscopic (reflex)     Status: Abnormal   Collection Time: 12/10/17 10:03 PM  Result Value Ref Range   RBC / HPF >50 0 - 5 RBC/hpf   WBC, UA 0-5 0 - 5 WBC/hpf   Bacteria, UA MANY (A) NONE SEEN   Squamous Epithelial / LPF 0-5 0 - 5   Ca Oxalate Crys, UA PRESENT   Lipase, blood     Status: None   Collection Time: 12/10/17 10:32 PM  Result Value Ref Range   Lipase 40 11 - 51 U/L  Comprehensive metabolic panel     Status: Abnormal   Collection Time: 12/10/17 10:32 PM  Result Value Ref Range   Sodium 142 135 - 145  mmol/L   Potassium 3.9 3.5 - 5.1 mmol/L   Chloride 106 101 - 111 mmol/L   CO2 28 22 - 32 mmol/L   Glucose, Bld 163 (H) 65 - 99 mg/dL   BUN 30 (H) 6 - 20 mg/dL   Creatinine, Ser 0.99 0.44 - 1.00 mg/dL   Calcium 9.2 8.9 - 10.3 mg/dL   Total Protein 7.5 6.5 - 8.1 g/dL   Albumin 4.0 3.5 - 5.0  g/dL   AST 21 15 - 41 U/L   ALT 19 14 - 54 U/L   Alkaline Phosphatase 85 38 - 126 U/L   Total Bilirubin 0.3 0.3 - 1.2 mg/dL   GFR calc non Af Amer >60 >60 mL/min   GFR calc Af Amer >60 >60 mL/min   Anion gap 8 5 - 15  CBC     Status: Abnormal   Collection Time: 12/10/17 10:32 PM  Result Value Ref Range   WBC 10.4 4.0 - 10.5 K/uL   RBC 4.99 3.87 - 5.11 MIL/uL   Hemoglobin 13.7 12.0 - 15.0 g/dL   HCT 42.7 36.0 - 46.0 %   MCV 85.6 78.0 - 100.0 fL   MCH 27.5 26.0 - 34.0 pg   MCHC 32.1 30.0 - 36.0 g/dL   RDW 16.5 (H) 11.5 - 15.5 %   Platelets 186 150 - 400 K/uL  Troponin I     Status: None   Collection Time: 12/10/17 10:32 PM  Result Value Ref Range   Troponin I <0.03 <0.03 ng/mL   Imaging Studies: Ct Renal Stone Study  Result Date: 12/11/2017 CLINICAL DATA:  Abdominal pain starting 1 hour ago and radiating to the back. Difficult to get a full breath. Nausea. EXAM: CT ABDOMEN AND PELVIS WITHOUT CONTRAST TECHNIQUE: Multidetector CT imaging of the abdomen and pelvis was performed following the standard protocol without IV contrast. COMPARISON:  None. FINDINGS: Lower chest: Lung bases are clear.  Small esophageal hiatal hernia. Hepatobiliary: No focal liver abnormality is seen. Status post cholecystectomy. No biliary dilatation. Pancreas: Unremarkable. No pancreatic ductal dilatation or surrounding inflammatory changes. Spleen: Normal in size without focal abnormality. Adrenals/Urinary Tract: No adrenal gland nodules. Intrarenal stone in the lower pole right kidney measuring 4 mm diameter. 2 mm stone in the mid left ureter at the level of L4. Proximal hydronephrosis and hydroureter with stranding around  the left kidney and ureter. Distal left ureter is decompressed. Bladder is decompressed. No bladder stones identified. Stomach/Bowel: Stomach and small bowel are decompressed. Stool-filled colon without abnormal distention. No wall thickening or inflammatory changes. Appendix is normal. Vascular/Lymphatic: Aortic atherosclerosis. No enlarged abdominal or pelvic lymph nodes. Reproductive: Uterus and bilateral adnexa are unremarkable. Other: Small to moderate periumbilical hernia containing fat. No free air or free fluid in the abdomen. Musculoskeletal: Degenerative changes throughout the lumbar spine with multilevel degenerative disc disease. No destructive bone lesions. IMPRESSION: 1. 2 mm stone in the mid left ureter with moderate proximal obstruction. 2. 4 mm nonobstructing intrarenal stone in the right kidney. 3. Aortic atherosclerosis. 4. Small to moderate periumbilical hernia containing fat. Electronically Signed   By: Lucienne Capers M.D.   On: 12/11/2017 02:43    ED COURSE and MDM  Nursing notes and initial vitals signs, including pulse oximetry, reviewed.  Vitals:   12/10/17 2057 12/10/17 2100 12/10/17 2309 12/11/17 0220  BP:  139/90 (!) 145/74 140/66  Pulse:  86 78 87  Resp:  20 20 20   Temp:  98 F (36.7 C) 97.7 F (36.5 C) 98.6 F (37 C)  TempSrc:  Oral Oral Oral  SpO2:  99% 100% 98%  Weight: 104.3 kg (230 lb)     Height: 5\' 8"  (1.727 m)      2:50 AM Pain adequately controlled at this time.  Patient advised that the stone will likely pass on its own but we will provide pain and nausea medication in the meantime.  We will also refer to urology should this time not  pass.  She was advised to return if signs of a urinary tract infection returns such as burning with urination, fever or chills.  PROCEDURES    ED DIAGNOSES     ICD-10-CM   1. Ureteral colic E55        Eulises Kijowski, MD 12/11/17 [phone]

## 2018-03-28 ENCOUNTER — Other Ambulatory Visit: Payer: Self-pay | Admitting: Internal Medicine

## 2018-03-28 DIAGNOSIS — Z1231 Encounter for screening mammogram for malignant neoplasm of breast: Secondary | ICD-10-CM

## 2018-03-29 ENCOUNTER — Ambulatory Visit
Admission: RE | Admit: 2018-03-29 | Discharge: 2018-03-29 | Disposition: A | Discharge status: Home / Self Care | Payer: 59 | Source: Ambulatory Visit | Attending: Internal Medicine | Admitting: Internal Medicine

## 2018-03-29 ENCOUNTER — Other Ambulatory Visit: Payer: Self-pay | Admitting: Internal Medicine

## 2018-03-29 DIAGNOSIS — Z87442 Personal history of urinary calculi: Secondary | ICD-10-CM

## 2018-03-29 DIAGNOSIS — R3129 Other microscopic hematuria: Secondary | ICD-10-CM

## 2018-03-29 DIAGNOSIS — Z9889 Other specified postprocedural states: Secondary | ICD-10-CM

## 2018-03-29 DIAGNOSIS — R109 Unspecified abdominal pain: Secondary | ICD-10-CM

## 2018-03-29 DIAGNOSIS — R10A1 Flank pain, right side: Secondary | ICD-10-CM

## 2018-04-28 ENCOUNTER — Ambulatory Visit: Payer: 59

## 2018-05-01 ENCOUNTER — Ambulatory Visit
Admission: RE | Admit: 2018-05-01 | Discharge: 2018-05-01 | Disposition: A | Discharge status: Home / Self Care | Payer: 59 | Source: Ambulatory Visit | Attending: Internal Medicine | Admitting: Internal Medicine

## 2018-05-01 DIAGNOSIS — Z1231 Encounter for screening mammogram for malignant neoplasm of breast: Secondary | ICD-10-CM

## 2018-08-22 ENCOUNTER — Encounter: Payer: Self-pay | Admitting: Gynecology

## 2018-08-22 ENCOUNTER — Ambulatory Visit (INDEPENDENT_AMBULATORY_CARE_PROVIDER_SITE_OTHER): Payer: No Typology Code available for payment source | Admitting: Gynecology

## 2018-08-22 VITALS — BP 124/80 | Ht 67.5 in | Wt 228.0 lb

## 2018-08-22 DIAGNOSIS — R1031 Right lower quadrant pain: Secondary | ICD-10-CM | POA: Diagnosis not present

## 2018-08-22 DIAGNOSIS — R102 Pelvic and perineal pain: Secondary | ICD-10-CM

## 2018-08-22 NOTE — Patient Instructions (Signed)
Return with your urine specimen.

## 2018-08-22 NOTE — Progress Notes (Signed)
    Patricia Delacruz 11/01/1955 [id number]        63 y.o.  [id number] former patient of Dr. Toney Rakes presents with right lower quadrant/groin pain and pelvic/suprapubic pressure on and off.  Has also been having some issues with diarrhea and constipation.  No vaginal symptoms such as discharge, odor or irritation.  Has a history of renal lithiasis reportedly treated twice over the past year for kidney stones with her last evaluation by urology in August.  Has been treated this year several times for UTIs by urgent care and her primary physician stating that her symptoms transiently seem to improve but when she stops the antibiotics they return.  Her last antibiotic was this past Sunday.  No significant low back pain fever or chills.  No nausea vomiting.  Status post laparoscopic BSO in the past for benign indications.  Past medical history,surgical history, problem list, medications, allergies, family history and social history were all reviewed and documented in the EPIC chart.  Directed ROS with pertinent positives and negatives documented in the history of present illness/assessment and plan.  Exam: Caryn Bee assistant Vitals:   08/22/18 1218  BP: 124/80  Weight: 228 lb (103.4 kg)  Height: 5' 7.5" (1.715 m)   General appearance:  Normal Spine straight without CVA tenderness Abdomen soft nontender without masses guarding rebound.  No evidence of hernia in the right lower quadrant or groin region. Pelvic external BUS vagina with atrophic changes.  Cervix with atrophic changes.  Bimanual without gross masses or tenderness.  Assessment/Plan:  63 y.o. [id number] with history suggestive of recurrent UTIs.  I do not have access to the prior urine analysis or culture results.  She relates the last antibiotic was penicillin but unclear what she was treated with prior to this.  Unable to leave a urine analysis at this point and she is going to return to leave a urine sample.  Discussed possible referral to  urology for longer-term management/rule out lithiasis as etiology for UTIs.  Will follow-up for her urine analysis today as she will return with a sample and then will go from there.    Anastasio Auerbach MD, 12:48 PM 08/22/2018

## 2018-09-13 ENCOUNTER — Ambulatory Visit: Payer: 59 | Admitting: Gynecology

## 2018-09-21 ENCOUNTER — Other Ambulatory Visit: Payer: Self-pay | Admitting: Urology

## 2018-10-04 ENCOUNTER — Encounter (HOSPITAL_COMMUNITY): Payer: Self-pay | Admitting: *Deleted

## 2018-10-04 ENCOUNTER — Other Ambulatory Visit: Payer: Self-pay

## 2018-10-04 NOTE — Progress Notes (Signed)
SPOKE W/  Patient via phone     SCREENING SYMPTOMS OF COVID 19:   COUGH--no  RUNNY NOSE--- no  SORE THROAT---no NASAL CONGESTION----no SNEEZING----no  SHORTNESS OF BREATH---no  DIFFICULTY BREATHING---no  TEMP >100.4-----no  UNEXPLAINED BODY ACHES------no   HAVE YOU OR ANY FAMILY MEMBER TRAVELLED PAST 14 DAYS OUT OF THE   COUNTY---no STATE----no COUNTRY----no  HAVE YOU OR ANY FAMILY MEMBER BEEN EXPOSED TO ANYONE WITH COVID 19? No   Spoke to patient via phone,history obtained,updated.  Bring blue folder,insurance cards,picture ID,designated driver and living will,POA, if desires (to be placed on chart). Reinforced no aspirin(instructions to hold aspirin per your doctor), ibuprofen products 72 hours prior to procedure. No vitamins or herbal medicines 7 days prior to procedure.   Follow laxative instructions provided by urologist (office) and in blue folder. Wear easy on/off clothing and no jewelry except wedding rings and ear rings. Leave all other valuables at home. Verbalizes understanding of instructions  No alcohol 24 hours prior to procedure.call md office if you have a cold,sorethroat or fever. Shower or bathe before your treatment. NPO after midnight except for altace with sip of water

## 2018-10-05 ENCOUNTER — Inpatient Hospital Stay (HOSPITAL_COMMUNITY): Admission: RE | Admit: 2018-10-05 | Payer: No Typology Code available for payment source | Source: Ambulatory Visit

## 2018-10-09 ENCOUNTER — Encounter (HOSPITAL_COMMUNITY)
Admission: RE | Disposition: A | Discharge status: Home / Self Care | Payer: Self-pay | Source: Home / Self Care | Attending: Urology

## 2018-10-09 ENCOUNTER — Ambulatory Visit (HOSPITAL_COMMUNITY)
Admission: RE | Admit: 2018-10-09 | Discharge: 2018-10-09 | Disposition: A | Discharge status: Home / Self Care | Payer: No Typology Code available for payment source | Attending: Urology | Admitting: Urology

## 2018-10-09 ENCOUNTER — Ambulatory Visit (HOSPITAL_COMMUNITY): Payer: No Typology Code available for payment source

## 2018-10-09 ENCOUNTER — Encounter (HOSPITAL_COMMUNITY): Payer: Self-pay | Admitting: *Deleted

## 2018-10-09 ENCOUNTER — Other Ambulatory Visit: Payer: Self-pay

## 2018-10-09 DIAGNOSIS — N201 Calculus of ureter: Secondary | ICD-10-CM | POA: Insufficient documentation

## 2018-10-09 DIAGNOSIS — E669 Obesity, unspecified: Secondary | ICD-10-CM | POA: Diagnosis not present

## 2018-10-09 DIAGNOSIS — Z881 Allergy status to other antibiotic agents status: Secondary | ICD-10-CM | POA: Insufficient documentation

## 2018-10-09 DIAGNOSIS — E119 Type 2 diabetes mellitus without complications: Secondary | ICD-10-CM | POA: Insufficient documentation

## 2018-10-09 DIAGNOSIS — I10 Essential (primary) hypertension: Secondary | ICD-10-CM | POA: Insufficient documentation

## 2018-10-09 DIAGNOSIS — N200 Calculus of kidney: Secondary | ICD-10-CM | POA: Diagnosis present

## 2018-10-09 DIAGNOSIS — Z79899 Other long term (current) drug therapy: Secondary | ICD-10-CM | POA: Diagnosis not present

## 2018-10-09 HISTORY — PX: EXTRACORPOREAL SHOCK WAVE LITHOTRIPSY: SHX1557

## 2018-10-09 HISTORY — DX: Personal history of urinary calculi: Z87.442

## 2018-10-09 LAB — GLUCOSE, CAPILLARY: Glucose-Capillary: 107 mg/dL — ABNORMAL HIGH (ref 70–99)

## 2018-10-09 SURGERY — LITHOTRIPSY, ESWL
Anesthesia: LOCAL | Laterality: Right

## 2018-10-09 MED ORDER — CIPROFLOXACIN HCL 500 MG PO TABS
500.0000 mg | ORAL_TABLET | Freq: Once | ORAL | Status: AC
  Administered 2018-10-09: 500 mg via ORAL
  Filled 2018-10-09: qty 1

## 2018-10-09 MED ORDER — SODIUM CHLORIDE 0.9 % IV SOLN
INTRAVENOUS | Status: DC
  Administered 2018-10-09: 07:00:00 via INTRAVENOUS

## 2018-10-09 MED ORDER — DIAZEPAM 5 MG PO TABS
10.0000 mg | ORAL_TABLET | ORAL | Status: AC
  Administered 2018-10-09: 10 mg via ORAL
  Filled 2018-10-09: qty 2

## 2018-10-09 MED ORDER — DIPHENHYDRAMINE HCL 25 MG PO CAPS
25.0000 mg | ORAL_CAPSULE | ORAL | Status: AC
  Administered 2018-10-09: 25 mg via ORAL
  Filled 2018-10-09: qty 1

## 2018-10-09 NOTE — Op Note (Signed)
See [facility] OP note scanned into chart. Also because of the size, density, location and other factors that cannot be anticipated I feel this will likely be a staged procedure. This fact supersedes any indication in the scanned [facility] operative note to the contrary.  

## 2018-10-09 NOTE — H&P (Signed)
See scanned H&P

## 2018-10-09 NOTE — Discharge Instructions (Signed)
Refer to [facility].

## 2018-10-10 ENCOUNTER — Encounter (HOSPITAL_COMMUNITY): Payer: Self-pay | Admitting: Urology

## 2019-03-20 ENCOUNTER — Encounter: Payer: Self-pay | Admitting: Gynecology

## 2020-08-17 IMAGING — CR ABDOMEN - 1 VIEW
2 series · 2 of 2 positions shown · non-contrast
Comparison: Prior KUB 09/19/2018; prior CT scan of the abdomen and
pelvis 08/22/2018

CLINICAL DATA: 62-year-old female undergoing preoperative
evaluation prior to right-sided lithotripsy

EXAM:
ABDOMEN - 1 VIEW

[t abdomen supine (1 of 2)]
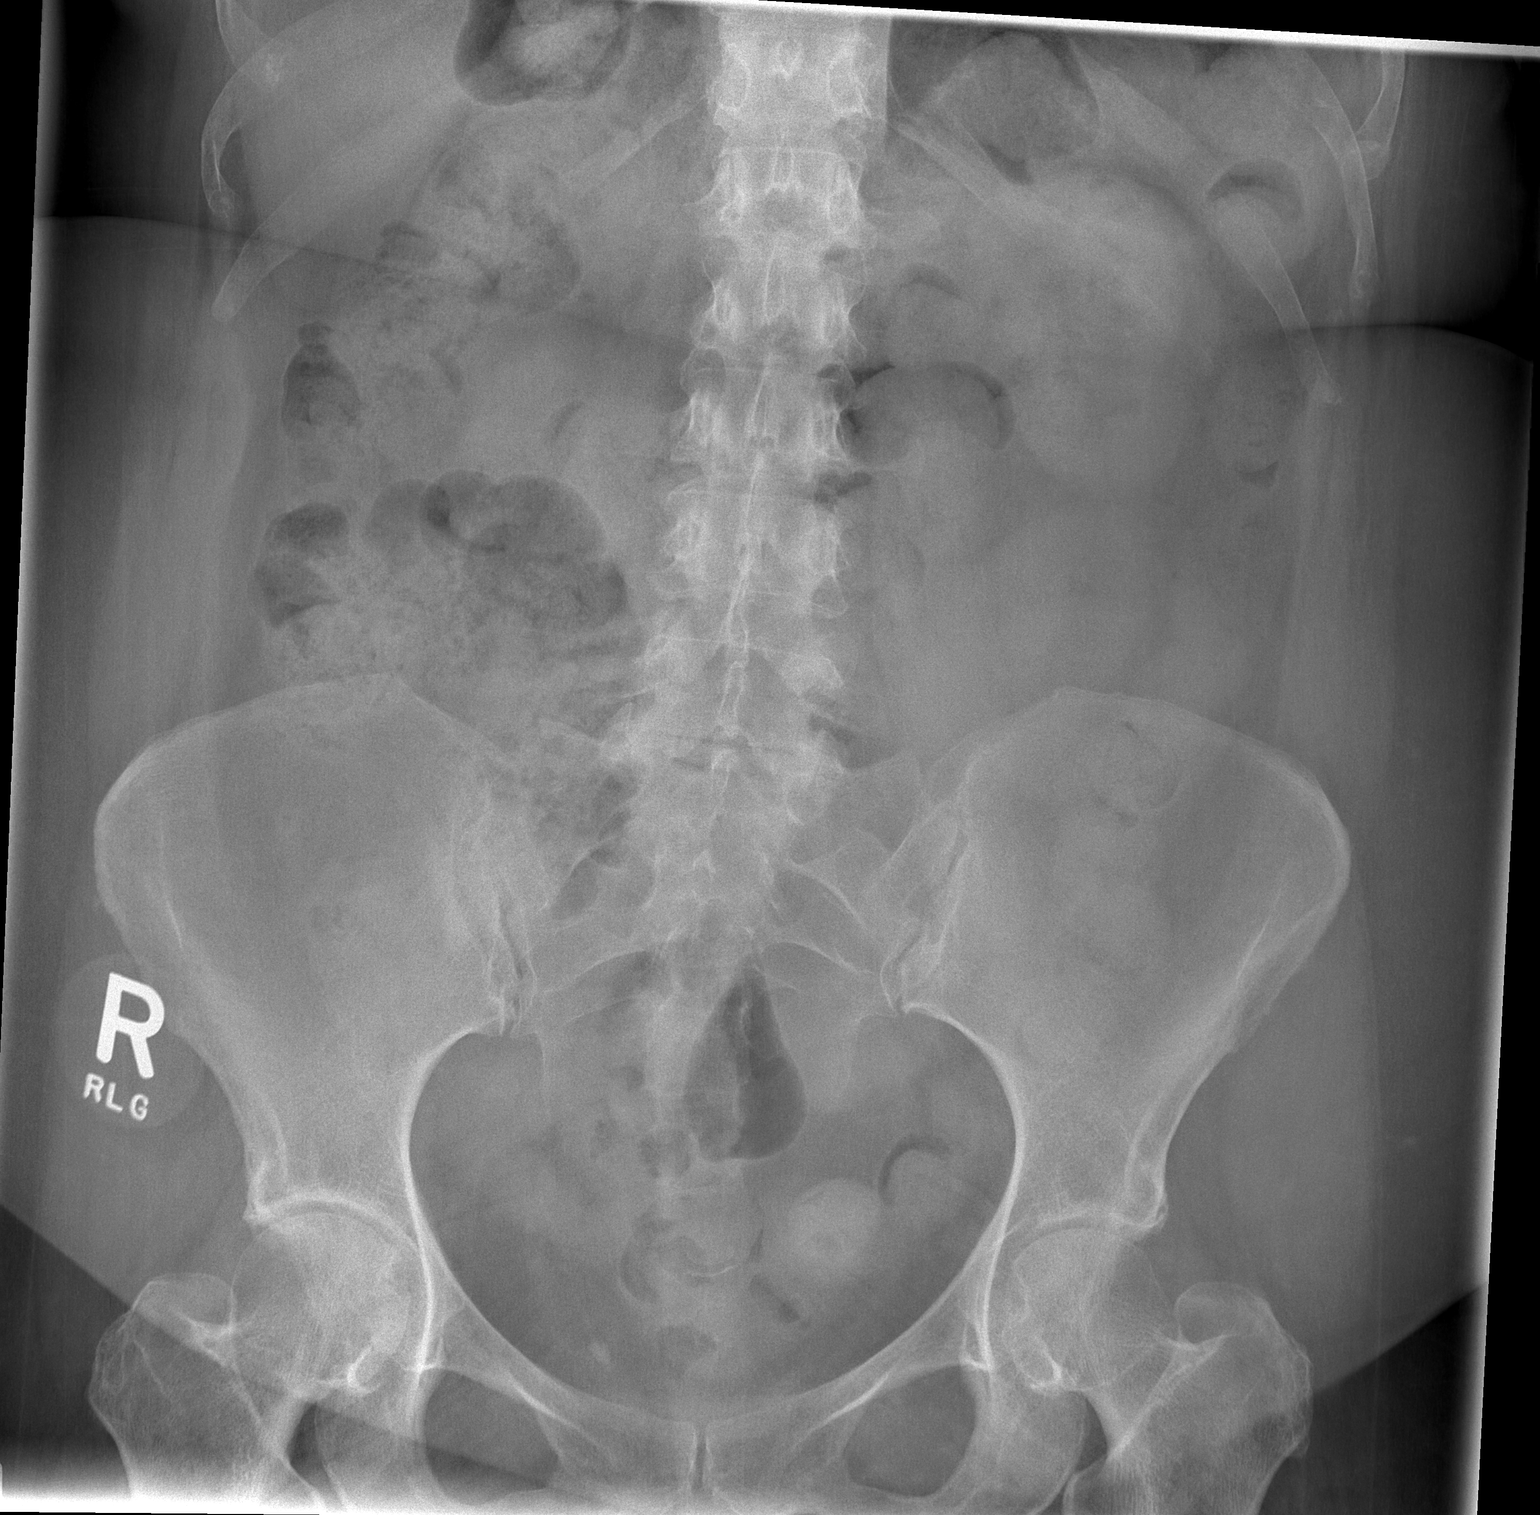

[t abdomen supine (2 of 2)]
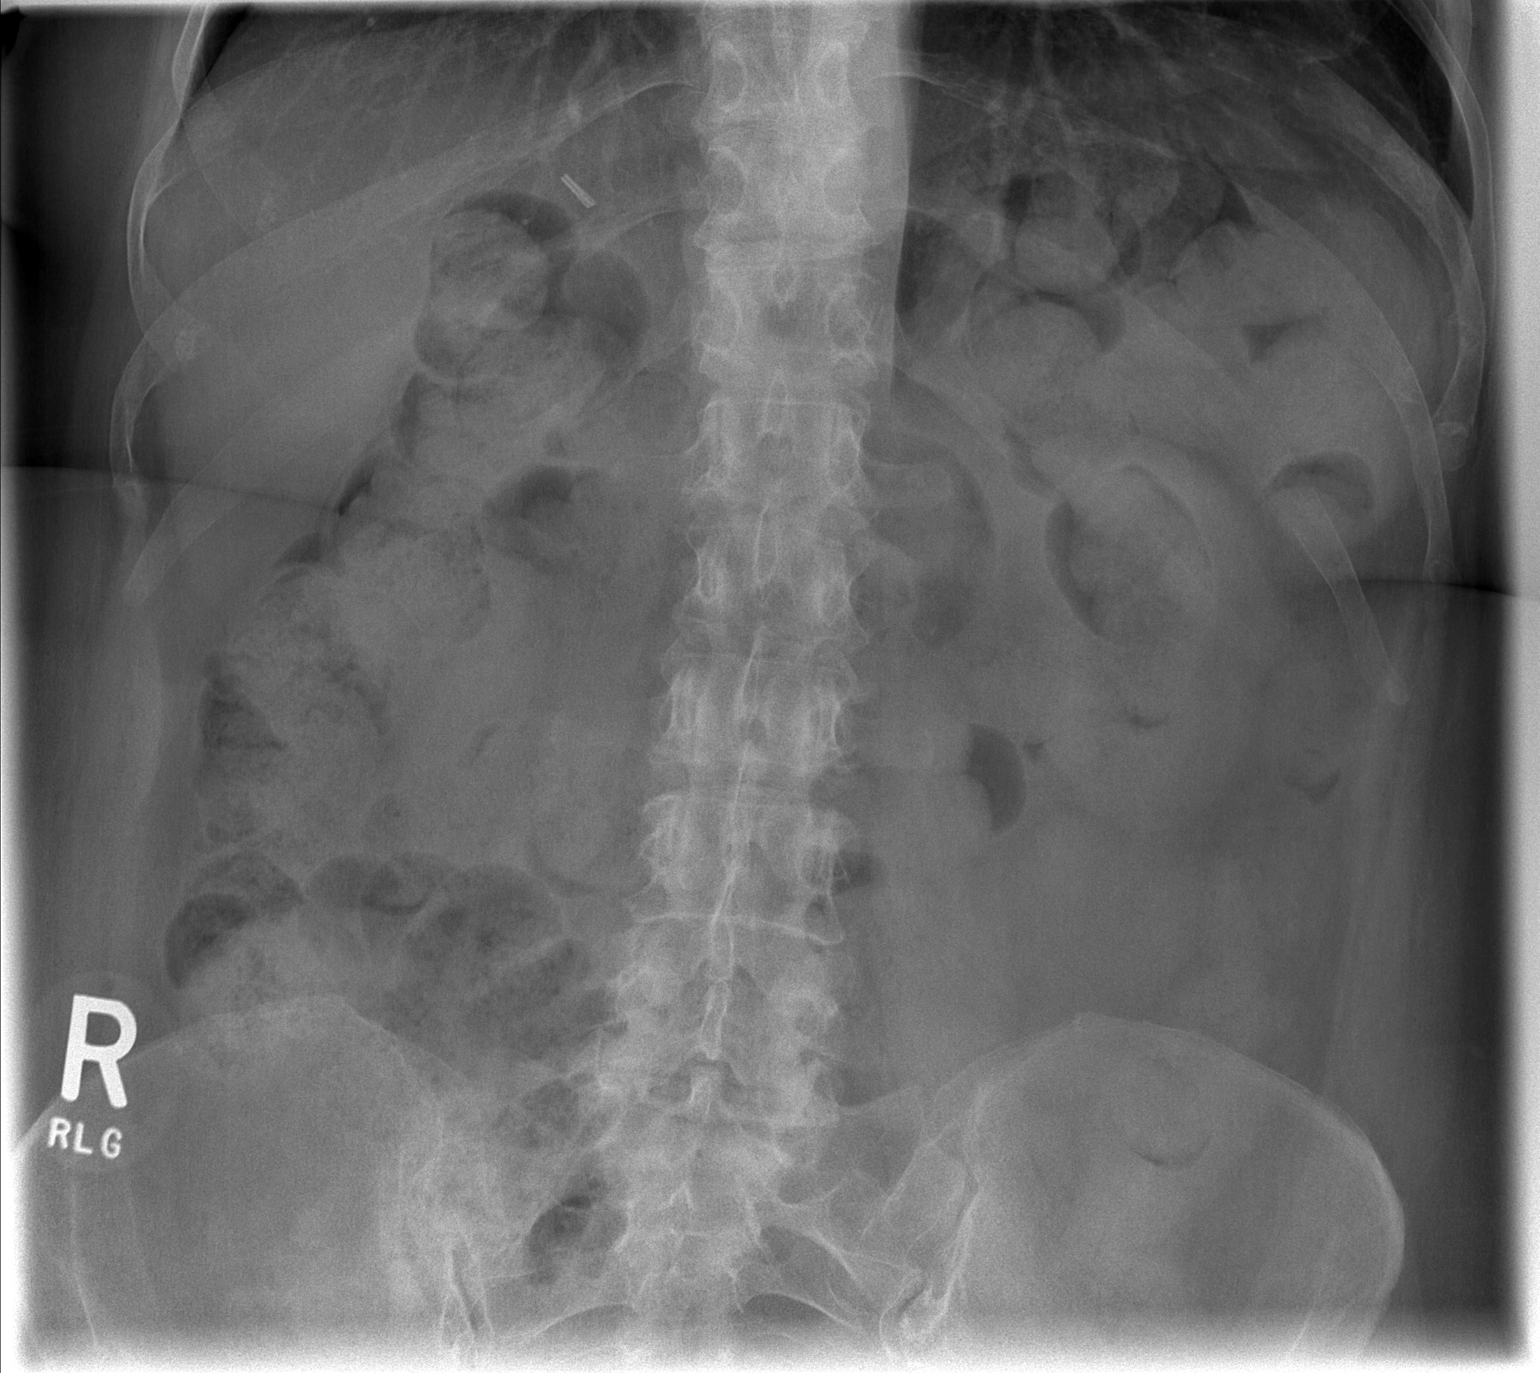

[2 of 2 positions shown; findings below may reference images not displayed]

FINDINGS: Stable appearance of approximately 8 mm radiopacity overlying the
expected location of the right nephroureteral junction. No
additional nephrolithiasis identified. The bowel gas pattern is not
obstructed. Mild bilateral hip joint osteoarthritis.
IMPRESSION: Stable appearance of approximately 8 mm distal right ureteral stone.

## 2021-01-20 ENCOUNTER — Ambulatory Visit: Payer: No Typology Code available for payment source | Admitting: Dietician

## 2022-12-31 NOTE — Therapy (Signed)
OUTPATIENT PHYSICAL THERAPY EVALUATION   Patient Name: Tanae Romer MRN: [id number] DOB:01/13/1956, 67 y.o., female Today's Date: 01/07/2023  END OF SESSION:  PT End of Session - 01/07/23 1100     Visit Number 1    Number of Visits 20    Date for PT Re-Evaluation 03/18/23    Authorization Type [employer] $15 copay    Authorization - Number of Visits 25    Progress Note Due on Visit 10    PT Start Time 1103    PT Stop Time 1142    PT Time Calculation (min) 39 min    Activity Tolerance Patient tolerated treatment well    Behavior During Therapy WFL for tasks assessed/performed             Past Medical History:  Diagnosis Date   Anxiety    CIN II (cervical intraepithelial neoplasia II)    Complication of anesthesia    pt feels like "I can't breathe after surgery"   Condyloma acuminata    VULVAR   Depression    Diabetes mellitus    oral ACTOS   Headache(784.0)    occas   High risk HPV infection    History of kidney stones    Hypertension    Past Surgical History:  Procedure Laterality Date   CESAREAN SECTION  1990   CHOLECYSTECTOMY  2011   CO 2 LASER      CERVIX /VAGINA   COLONOSCOPY  NOV/03/2008   NEXT IN 10 YEARS   EXTRACORPOREAL SHOCK WAVE LITHOTRIPSY Right 10/09/2018   Procedure: EXTRACORPOREAL SHOCK WAVE LITHOTRIPSY (ESWL);  Surgeon: Crista Elliot, MD;  Location: [facility];  Service: Urology;  Laterality: Right;   LAPAROSCOPY  03/13/2012   Procedure: LAPAROSCOPY OPERATIVE;  Surgeon: Ok Edwards, MD;  Location: [facility];  Service: Gynecology;  Laterality: N/A;   SALPINGOOPHORECTOMY  03/13/2012   Procedure: SALPINGO OOPHERECTOMY;  Surgeon: Ok Edwards, MD;  Location: [facility];  Service: Gynecology;  Laterality: Bilateral;   Patient Active Problem List   Diagnosis Date Noted   Surgical menopause 09/28/2013   Uterus fibroma 02/02/2012   Condyloma acuminatum of vulva 01/28/2012   CIN II (cervical intraepithelial neoplasia II) 01/28/2012   Depression  01/28/2012   Anxiety 01/28/2012    PCP: Ralene Ok, MD  REFERRING PROVIDER: Ralene Ok, MD  REFERRING DIAG: [phone] (ICD-10-CM) - Right knee pain  THERAPY DIAG:  Chronic pain of right knee  Other low back pain  Muscle weakness (generalized)  Difficulty in walking, not elsewhere classified  Localized edema  Rationale for Evaluation and Treatment: Rehabilitation  ONSET DATE: Worsened in 08/2022  SUBJECTIVE:   SUBJECTIVE STATEMENT: Referral for complaints of Rt knee pain c swelling per MD script.  Pt indicated having difficulty/pain with pressure on Rt knee with stairs.  Reported complaints with prolonged sitting and WB activity.  Reported some complaints with Lt knee as well and history of back pain.  Reported difficulty getting up and down from floor.   Pt denied trouble sleeping due to Rt knee symptoms.     Reported complaints in back with standing prolonged 10 mins or more, noted in daily activity.  Denied radicular symptoms.   PERTINENT HISTORY: DM2, HTN, hyperlipidemia, depression, anemia, obesity, Lt carpal tunnel syndrome.  History of back pain   PAIN:  NPRS scale: no pain at rest, at worst 4/10 Pain location: Rt knee  Pain description: swollen, tightness, throbbing Aggravating factors: stairs, transfers, WB Pressure, prolonged sitting, movement after sitting.  Relieving factors: trying to move some to help stiffness, resting in recliner.   PRECAUTIONS: None  WEIGHT BEARING RESTRICTIONS: No  FALLS:  Has patient fallen in last 6 months? No Mild fear of tripping  LIVING ENVIRONMENT: Lives in: House/apartment Stairs: 2 story house.  Lt rail going up.   OCCUPATION: Works at [employer] 10 hr day  (works at home)  PLOF: Independent  PATIENT GOALS: Reduce pain, help back and knee.   OBJECTIVE:   PATIENT SURVEYS:  01/07/2023 FOTO intake: 40   predicted:  54  COGNITION: 01/07/2023 Overall cognitive status: WFL    SENSATION: 01/07/2023 No limitations  noted   EDEMA:  01/07/2023 Visual edema Rt knee   MUSCLE LENGTH: 01/07/2023 No specific testing today  POSTURE:  01/07/2023 Mild reduced lumbar lordosis in standing posture today.  Mild weight shift to Lt leg compared to Rt in standing.   PALPATION: 01/07/2023 Mild tenderness across lumbar    LUMBAR ROM:   ROM AROM  01/07/2023  Flexion To mid shin without complaints.   Extension 50% WFL in standing c mild end range symptoms  Repeated x 5 in standing with improvement to 75%  Right lateral flexion   Left lateral flexion   Right rotation   Left rotation    (Blank rows = not tested)    LOWER EXTREMITY ROM:   ROM Right 01/07/2023 Left 01/07/2023  Hip flexion    Hip extension    Hip abduction    Hip adduction    Hip internal rotation    Hip external rotation    Knee flexion 130 AROM   Knee extension 0 0  Ankle dorsiflexion    Ankle plantarflexion    Ankle inversion    Ankle eversion     (Blank rows = not tested)  LOWER EXTREMITY MMT:  MMT Right 01/07/2023 Left 01/07/2023  Hip flexion 5/5 5/5  Hip extension    Hip abduction    Hip adduction    Hip internal rotation    Hip external rotation    Knee flexion    Knee extension 5/5 c pain 34, 31 lbs 5/5 33.2, 31.4 lbs  Ankle dorsiflexion 5/5 5/5  Ankle plantarflexion    Ankle inversion    Ankle eversion     (Blank rows = not tested)  SPECIAL TESTS:  01/07/2023 (-) Slump bilateral   FUNCTIONAL TESTS:  01/07/2023 18 inch chair transfer:  attempt with hands on knee, mild shift to Lt  LE. Lt SLS:  < 3 seconds Rt SLS: < 3 seconds  GAIT: 01/07/2023 Independent ambulation c decreased stance on Rt leg, maintained knee flexion in stance.  TODAY'S TREATMENT                                                                           DATE:01/07/2023 Therex:    HEP instruction/performance c cues for techniques, handout provided.  Trial set performed of each for comprehension and symptom assessment.  See below for exercise list  Vaspneumatic  Rt knee in supine elevation 34 deg low compression 10 mins  PATIENT EDUCATION:  01/07/2023 Education details: HEP, POC Person educated: Patient Education method: Programmer, multimedia, Demonstration, Verbal cues, and Handouts Education comprehension: verbalized understanding, returned demonstration, and verbal cues required  HOME EXERCISE PROGRAM: Access Code: [email or web address] URL: [email or web address] Date: 01/07/2023 Prepared by: Chyrel Masson  Exercises - Standing Lumbar Extension with Counter  - 3-5 x daily - 7 x weekly - 1 sets - 5-10 reps - Supine Lower Trunk Rotation  - 3-5 x daily - 7 x weekly - 1 sets - 3-5 reps - 15 hold - Seated Long Arc Quad  - 3-5 x daily - 7 x weekly - 1-2 sets - 10 reps - 2 hold - Supine Bridge  - 1-2 x daily - 7 x weekly - 1-2 sets - 10 reps - 2 hold - Seated Quad Set  - 3-5 x daily - 7 x weekly - 1 sets - 10 reps - 5 hold - Seated Straight Leg Heel Taps  - 1-2 x daily - 7 x weekly - 3 sets - 10 reps  ASSESSMENT:  CLINICAL IMPRESSION: Patient is a 67 y.o. who comes to clinic with complaints of Rt knee pain , back pain with mobility, strength and movement coordination deficits that impair their ability to perform usual daily and recreational functional activities without increase difficulty/symptoms at this time.  Patient to benefit from skilled PT services to address impairments and limitations to improve to previous level of function without restriction secondary to condition.   OBJECTIVE IMPAIRMENTS: Abnormal gait, decreased activity tolerance, decreased balance, decreased coordination, decreased endurance, decreased mobility, difficulty walking, decreased ROM, decreased strength, hypomobility, increased edema, impaired perceived functional  ability, impaired flexibility, improper body mechanics, and pain.   ACTIVITY LIMITATIONS: carrying, lifting, bending, sitting, standing, squatting, stairs, transfers, and locomotion level  PARTICIPATION LIMITATIONS: meal prep, cleaning, laundry, interpersonal relationship, shopping, community activity, and occupation  PERSONAL FACTORS: Time since onset of injury/illness/exacerbation DM2, HTN, hyperlipidemia, depression, anemia, obesity, Lt carpal tunnel syndrome.  History of back painare also affecting patient's functional outcome.   REHAB POTENTIAL: Good  CLINICAL DECISION MAKING: Stable/uncomplicated  EVALUATION COMPLEXITY: Low   GOALS: Goals reviewed with patient? Yes  SHORT TERM GOALS: (target date for Short term goals are 3 weeks 01/28/2023)   1.  Patient will demonstrate independent use of home exercise program to maintain progress from in clinic treatments.  Goal status: New  LONG TERM GOALS: (target dates for all long term goals are 10 weeks  03/17/2023 )   1. Patient will demonstrate/report pain at worst less than or equal to 2/10 to facilitate minimal limitation in daily activity secondary to pain symptoms.  Goal status: New   2. Patient will demonstrate independent use of home exercise program to facilitate ability to maintain/progress functional gains from skilled physical therapy services.  Goal status:  New   3. Patient will demonstrate FOTO outcome > or = 54 % to indicate reduced disability due to condition.  Goal status: New   4.  Patient will demonstrate bilateral knee dynamometry extension > 45 lbs to faciltiate usual transfers, stairs, squatting at PLOF for daily life.   Goal status: New   5.  Patient will demonstrate ascending/descending stairs reciprocally with single rail assist for household navigation Goal status: New   6.  Patient will demonstrate bilateral SLS > 10 seconds to improve stability in ambulation.  Goal status: New   7.  Patient will  demonstrate 100 % WFL lumbar extension s symptoms for standing/walking posture.  Goal Status: New   PLAN:  PT FREQUENCY: 1-2x/week  PT DURATION: 10 weeks  PLANNED INTERVENTIONS: Therapeutic exercises, Therapeutic activity, Neuro Muscular re-education, Balance training, Gait training, Patient/Family education, Joint mobilization, Stair training, DME instructions, Dry Needling, Electrical stimulation, Traction, Cryotherapy, vasopneumatic deviceMoist heat, Taping, Ultrasound, Ionotophoresis 4mg /ml Dexamethasone, and aquatic therapy, Manual therapy.  All included unless contraindicated  PLAN FOR NEXT SESSION: Review HEP knowledge/results.   Vaso prn for swelling.  Progressive lumbar mobility and leg strengthening bilateral.    Chyrel Masson, PT, DPT, OCS, ATC 01/07/23  11:55 AM

## 2023-01-07 ENCOUNTER — Encounter: Payer: Self-pay | Admitting: Rehabilitative and Restorative Service Providers"

## 2023-01-07 ENCOUNTER — Ambulatory Visit (INDEPENDENT_AMBULATORY_CARE_PROVIDER_SITE_OTHER): Payer: No Typology Code available for payment source | Admitting: Rehabilitative and Restorative Service Providers"

## 2023-01-07 ENCOUNTER — Other Ambulatory Visit: Payer: Self-pay

## 2023-01-07 DIAGNOSIS — M25561 Pain in right knee: Secondary | ICD-10-CM

## 2023-01-07 DIAGNOSIS — M6281 Muscle weakness (generalized): Secondary | ICD-10-CM

## 2023-01-07 DIAGNOSIS — R600 Localized edema: Secondary | ICD-10-CM

## 2023-01-07 DIAGNOSIS — M5459 Other low back pain: Secondary | ICD-10-CM | POA: Diagnosis not present

## 2023-01-07 DIAGNOSIS — R262 Difficulty in walking, not elsewhere classified: Secondary | ICD-10-CM

## 2023-01-07 DIAGNOSIS — G8929 Other chronic pain: Secondary | ICD-10-CM

## 2023-01-11 ENCOUNTER — Telehealth: Payer: Self-pay | Admitting: Rehabilitative and Restorative Service Providers"

## 2023-01-12 ENCOUNTER — Encounter: Payer: No Typology Code available for payment source | Admitting: Physical Therapy

## 2023-01-13 NOTE — Therapy (Signed)
OUTPATIENT PHYSICAL THERAPY TREATMENT   Patient Name: Patricia Delacruz MRN: [id number] DOB:1956-05-11, 67 y.o., female Today's Date: 01/14/2023  END OF SESSION:  PT End of Session - 01/14/23 1145     Visit Number 2    Number of Visits 20    Date for PT Re-Evaluation 03/18/23    Authorization Type [employer] $15 copay    Authorization - Number of Visits 25    Progress Note Due on Visit 10    PT Start Time 1146    PT Stop Time 1230    PT Time Calculation (min) 44 min    Activity Tolerance Patient tolerated treatment well;No increased pain    Behavior During Therapy WFL for tasks assessed/performed              Past Medical History:  Diagnosis Date   Anxiety    CIN II (cervical intraepithelial neoplasia II)    Complication of anesthesia    pt feels like "I can't breathe after surgery"   Condyloma acuminata    VULVAR   Depression    Diabetes mellitus    oral ACTOS   Headache(784.0)    occas   High risk HPV infection    History of kidney stones    Hypertension    Past Surgical History:  Procedure Laterality Date   CESAREAN SECTION  1990   CHOLECYSTECTOMY  2011   CO 2 LASER      CERVIX /VAGINA   COLONOSCOPY  NOV/03/2008   NEXT IN 10 YEARS   EXTRACORPOREAL SHOCK WAVE LITHOTRIPSY Right 10/09/2018   Procedure: EXTRACORPOREAL SHOCK WAVE LITHOTRIPSY (ESWL);  Surgeon: Crista Elliot, MD;  Location: [facility];  Service: Urology;  Laterality: Right;   LAPAROSCOPY  03/13/2012   Procedure: LAPAROSCOPY OPERATIVE;  Surgeon: Ok Edwards, MD;  Location: [facility];  Service: Gynecology;  Laterality: N/A;   SALPINGOOPHORECTOMY  03/13/2012   Procedure: SALPINGO OOPHERECTOMY;  Surgeon: Ok Edwards, MD;  Location: [facility];  Service: Gynecology;  Laterality: Bilateral;   Patient Active Problem List   Diagnosis Date Noted   Surgical menopause 09/28/2013   Uterus fibroma 02/02/2012   Condyloma acuminatum of vulva 01/28/2012   CIN II (cervical intraepithelial neoplasia II) 01/28/2012    Depression 01/28/2012   Anxiety 01/28/2012    PCP: Ralene Ok, MD  REFERRING PROVIDER: Ralene Ok, MD  REFERRING DIAG: [phone] (ICD-10-CM) - Right knee pain  THERAPY DIAG:  Chronic pain of right knee  Other low back pain  Muscle weakness (generalized)  Difficulty in walking, not elsewhere classified  Localized edema  Rationale for Evaluation and Treatment: Rehabilitation  ONSET DATE: Worsened in 08/2022  SUBJECTIVE:  Per eval - Referral for complaints of Rt knee pain c swelling per MD script.  Pt indicated having difficulty/pain with pressure on Rt knee with stairs.  Reported complaints with prolonged sitting and WB activity.  Reported some complaints with Lt knee as well and history of back pain.  Reported difficulty getting up and down from floor.   Pt denied trouble sleeping due to Rt knee symptoms.     Reported complaints in back with standing prolonged 10 mins or more, noted in daily activity.  Denied radicular symptoms.   SUBJECTIVE STATEMENT: 01/14/2023 Pt states she has been doing exercises daily, states she sometimes gets some knee aggravation after exercises but that seems to be improving. 5-6/10 pain at present    PERTINENT HISTORY: DM2, HTN, hyperlipidemia, depression, anemia, obesity, Lt carpal tunnel syndrome.  History of  back pain   PAIN:  NPRS scale: no pain at rest, at worst 4/10 Pain location: Rt knee  Pain description: swollen, tightness, throbbing Aggravating factors: stairs, transfers, WB Pressure, prolonged sitting, movement after sitting.  Relieving factors: trying to move some to help stiffness, resting in recliner.   PRECAUTIONS: None  WEIGHT BEARING RESTRICTIONS: No  FALLS:  Has patient fallen in last 6 months? No Mild fear of tripping  LIVING ENVIRONMENT: Lives in: House/apartment Stairs: 2 story house.  Lt rail going up.   OCCUPATION: Works at [employer] 10 hr day  (works at home)  PLOF: Independent  PATIENT GOALS: Reduce pain,  help back and knee.   OBJECTIVE: (objective measures completed at initial evaluation unless otherwise dated)   PATIENT SURVEYS:  01/07/2023 FOTO intake: 40   predicted:  54  COGNITION: 01/07/2023 Overall cognitive status: WFL    SENSATION: 01/07/2023 No limitations noted   EDEMA:  01/07/2023 Visual edema Rt knee   MUSCLE LENGTH: 01/07/2023 No specific testing today  POSTURE:  01/07/2023 Mild reduced lumbar lordosis in standing posture today.  Mild weight shift to Lt leg compared to Rt in standing.   PALPATION: 01/07/2023 Mild tenderness across lumbar    LUMBAR ROM:   ROM AROM  01/07/2023  Flexion To mid shin without complaints.   Extension 50% WFL in standing c mild end range symptoms  Repeated x 5 in standing with improvement to 75%  Right lateral flexion   Left lateral flexion   Right rotation   Left rotation    (Blank rows = not tested)    LOWER EXTREMITY ROM:   ROM Right 01/07/2023 Left 01/07/2023  Hip flexion    Hip extension    Hip abduction    Hip adduction    Hip internal rotation    Hip external rotation    Knee flexion 130 AROM   Knee extension 0 0  Ankle dorsiflexion    Ankle plantarflexion    Ankle inversion    Ankle eversion     (Blank rows = not tested)  LOWER EXTREMITY MMT:  MMT Right 01/07/2023 Left 01/07/2023  Hip flexion 5/5 5/5  Hip extension    Hip abduction    Hip adduction    Hip internal rotation    Hip external rotation    Knee flexion    Knee extension 5/5 c pain 34, 31 lbs 5/5 33.2, 31.4 lbs  Ankle dorsiflexion 5/5 5/5  Ankle plantarflexion    Ankle inversion    Ankle eversion     (Blank rows = not tested)  SPECIAL TESTS:  01/07/2023 (-) Slump bilateral   FUNCTIONAL TESTS:  01/07/2023 18 inch chair transfer:  attempt with hands on knee, mild shift to Lt  LE. Lt SLS:  < 3 seconds Rt SLS: < 3 seconds  GAIT: 01/07/2023 Independent ambulation c decreased stance on Rt leg, maintained knee flexion in stance.  TODAY'S TREATMENT                                                                           [facility] Adult PT Treatment:                                                DATE: 01/14/23 Therapeutic Exercise: LAQ seated x12 BIL cues for pacing and quad contraction Seated TKE x12 BIL cues for quad contraction Seated heel tap SLR x10 Bil cues for pacing and soft tap Hooklying march x6 BIL cues for core contraction and appropriate ROM Mini squat in // bars 2x10 cues for symmetry of WB, rest breaks for fatigue Heel raises x10 // bars cues for reduced fwd trunk lean compensation Verbal HEP review + education   DATE:01/07/2023 Therex:    HEP instruction/performance c cues for techniques, handout provided.  Trial set performed of each for comprehension and symptom assessment.  See below for exercise list  Vaspneumatic  Rt knee in supine elevation 34 deg low compression 10 mins  PATIENT EDUCATION:  Education details: rationale for interventions, HEP  Person educated: Patient Education method: Explanation, Demonstration, Verbal cues, and Handouts Education comprehension: verbalized understanding, returned demonstration, and verbal cues required  HOME EXERCISE PROGRAM: Access Code: [email or web address] URL: [email or web address] Date: 01/07/2023 Prepared by: Chyrel Masson  Exercises - Standing Lumbar Extension with Counter  - 3-5 x daily - 7 x weekly - 1 sets - 5-10 reps - Supine Lower Trunk Rotation  - 3-5 x daily - 7 x weekly - 1 sets - 3-5 reps - 15 hold - Seated Long Arc Quad  - 3-5 x daily - 7 x weekly - 1-2 sets - 10 reps - 2 hold - Supine Bridge  - 1-2 x daily - 7 x weekly - 1-2 sets - 10 reps - 2 hold - Seated Quad Set  - 3-5 x daily - 7 x weekly - 1 sets - 10 reps - 5 hold - Seated Straight Leg Heel Taps  - 1-2 x daily - 7 x  weekly - 3 sets - 10 reps  ASSESSMENT:  CLINICAL IMPRESSION: 01/14/2023 Pt arrives with a bit more pain today (5-6/10) but notes that she feels HEP tolerance is improving. Today initiated with HEP review which pt does well with, minimal cues for appropriate performance. Following with addition of closed chain work with emphasis on pacing and comfortable ROM. No adverse events, reports some muscular fatigue but no increase in pain on departure. Recommend continuing along current POC in order to address relevant deficits and improve functional tolerance. Pt departs today's session in no acute distress, all voiced questions/concerns addressed appropriately from PT perspective.    Per eval - Patient is a 67 y.o. who comes to clinic with complaints of Rt knee pain , back pain with mobility, strength and movement coordination deficits that impair their ability to perform usual daily and recreational functional activities without increase difficulty/symptoms at this time.  Patient to benefit from skilled PT services to address impairments and limitations to improve to previous level of function without restriction  secondary to condition.   OBJECTIVE IMPAIRMENTS: Abnormal gait, decreased activity tolerance, decreased balance, decreased coordination, decreased endurance, decreased mobility, difficulty walking, decreased ROM, decreased strength, hypomobility, increased edema, impaired perceived functional ability, impaired flexibility, improper body mechanics, and pain.   ACTIVITY LIMITATIONS: carrying, lifting, bending, sitting, standing, squatting, stairs, transfers, and locomotion level  PARTICIPATION LIMITATIONS: meal prep, cleaning, laundry, interpersonal relationship, shopping, community activity, and occupation  PERSONAL FACTORS: Time since onset of injury/illness/exacerbation DM2, HTN, hyperlipidemia, depression, anemia, obesity, Lt carpal tunnel syndrome.  History of back painare also affecting  patient's functional outcome.   REHAB POTENTIAL: Good  CLINICAL DECISION MAKING: Stable/uncomplicated  EVALUATION COMPLEXITY: Low   GOALS: Goals reviewed with patient? Yes  SHORT TERM GOALS: (target date for Short term goals are 3 weeks 01/28/2023)   1.  Patient will demonstrate independent use of home exercise program to maintain progress from in clinic treatments.  Goal status: New  LONG TERM GOALS: (target dates for all long term goals are 10 weeks  03/17/2023 )   1. Patient will demonstrate/report pain at worst less than or equal to 2/10 to facilitate minimal limitation in daily activity secondary to pain symptoms.  Goal status: New   2. Patient will demonstrate independent use of home exercise program to facilitate ability to maintain/progress functional gains from skilled physical therapy services.  Goal status: New   3. Patient will demonstrate FOTO outcome > or = 54 % to indicate reduced disability due to condition.  Goal status: New   4.  Patient will demonstrate bilateral knee dynamometry extension > 45 lbs to faciltiate usual transfers, stairs, squatting at PLOF for daily life.   Goal status: New   5.  Patient will demonstrate ascending/descending stairs reciprocally with single rail assist for household navigation Goal status: New   6.  Patient will demonstrate bilateral SLS > 10 seconds to improve stability in ambulation.  Goal status: New   7.  Patient will demonstrate 100 % WFL lumbar extension s symptoms for standing/walking posture.  Goal Status: New   PLAN:  PT FREQUENCY: 1-2x/week  PT DURATION: 10 weeks  PLANNED INTERVENTIONS: Therapeutic exercises, Therapeutic activity, Neuro Muscular re-education, Balance training, Gait training, Patient/Family education, Joint mobilization, Stair training, DME instructions, Dry Needling, Electrical stimulation, Traction, Cryotherapy, vasopneumatic deviceMoist heat, Taping, Ultrasound, Ionotophoresis 4mg /ml  Dexamethasone, and aquatic therapy, Manual therapy.  All included unless contraindicated  PLAN FOR NEXT SESSION: Review HEP knowledge/results.   Vaso prn for swelling.  Progressive lumbar mobility and leg strengthening bilateral.    Ashley Murrain PT, DPT 01/14/2023 12:34 PM

## 2023-01-14 ENCOUNTER — Encounter: Payer: Self-pay | Admitting: Physical Therapy

## 2023-01-14 ENCOUNTER — Ambulatory Visit: Payer: No Typology Code available for payment source | Admitting: Physical Therapy

## 2023-01-14 DIAGNOSIS — M25561 Pain in right knee: Secondary | ICD-10-CM | POA: Diagnosis not present

## 2023-01-14 DIAGNOSIS — R262 Difficulty in walking, not elsewhere classified: Secondary | ICD-10-CM | POA: Diagnosis not present

## 2023-01-14 DIAGNOSIS — M5459 Other low back pain: Secondary | ICD-10-CM

## 2023-01-14 DIAGNOSIS — M6281 Muscle weakness (generalized): Secondary | ICD-10-CM

## 2023-01-14 DIAGNOSIS — R600 Localized edema: Secondary | ICD-10-CM

## 2023-01-14 DIAGNOSIS — G8929 Other chronic pain: Secondary | ICD-10-CM

## 2023-01-18 ENCOUNTER — Encounter: Payer: No Typology Code available for payment source | Admitting: Physical Therapy

## 2023-01-21 ENCOUNTER — Encounter: Payer: No Typology Code available for payment source | Admitting: Rehabilitative and Restorative Service Providers"

## 2023-01-25 ENCOUNTER — Encounter: Payer: No Typology Code available for payment source | Admitting: Rehabilitative and Restorative Service Providers"

## 2023-01-28 ENCOUNTER — Encounter: Payer: Self-pay | Admitting: Rehabilitative and Restorative Service Providers"

## 2023-01-28 ENCOUNTER — Telehealth: Payer: Self-pay | Admitting: Rehabilitative and Restorative Service Providers"

## 2023-01-28 ENCOUNTER — Ambulatory Visit: Payer: No Typology Code available for payment source | Admitting: Rehabilitative and Restorative Service Providers"

## 2023-01-28 DIAGNOSIS — M5459 Other low back pain: Secondary | ICD-10-CM | POA: Diagnosis not present

## 2023-01-28 DIAGNOSIS — R600 Localized edema: Secondary | ICD-10-CM

## 2023-01-28 DIAGNOSIS — M6281 Muscle weakness (generalized): Secondary | ICD-10-CM

## 2023-01-28 DIAGNOSIS — R262 Difficulty in walking, not elsewhere classified: Secondary | ICD-10-CM

## 2023-01-28 DIAGNOSIS — M25561 Pain in right knee: Secondary | ICD-10-CM | POA: Diagnosis not present

## 2023-01-28 DIAGNOSIS — G8929 Other chronic pain: Secondary | ICD-10-CM

## 2023-01-28 NOTE — Therapy (Signed)
OUTPATIENT PHYSICAL THERAPY TREATMENT   Patient Name: Patricia Delacruz MRN: [id number] DOB:1956/06/17, 67 y.o., female Today's Date: 01/28/2023  END OF SESSION:  PT End of Session - 01/28/23 1201     Visit Number 3    Number of Visits 20    Date for PT Re-Evaluation 03/18/23    Authorization Type [employer] $15 copay    Authorization - Number of Visits 25    Progress Note Due on Visit 10    PT Start Time 1143    PT Stop Time 1223    PT Time Calculation (min) 40 min    Activity Tolerance Patient tolerated treatment well    Behavior During Therapy WFL for tasks assessed/performed               Past Medical History:  Diagnosis Date   Anxiety    CIN II (cervical intraepithelial neoplasia II)    Complication of anesthesia    pt feels like "I can't breathe after surgery"   Condyloma acuminata    VULVAR   Depression    Diabetes mellitus    oral ACTOS   Headache(784.0)    occas   High risk HPV infection    History of kidney stones    Hypertension    Past Surgical History:  Procedure Laterality Date   CESAREAN SECTION  1990   CHOLECYSTECTOMY  2011   CO 2 LASER      CERVIX /VAGINA   COLONOSCOPY  NOV/03/2008   NEXT IN 10 YEARS   EXTRACORPOREAL SHOCK WAVE LITHOTRIPSY Right 10/09/2018   Procedure: EXTRACORPOREAL SHOCK WAVE LITHOTRIPSY (ESWL);  Surgeon: Crista Elliot, MD;  Location: [facility];  Service: Urology;  Laterality: Right;   LAPAROSCOPY  03/13/2012   Procedure: LAPAROSCOPY OPERATIVE;  Surgeon: Ok Edwards, MD;  Location: [facility];  Service: Gynecology;  Laterality: N/A;   SALPINGOOPHORECTOMY  03/13/2012   Procedure: SALPINGO OOPHERECTOMY;  Surgeon: Ok Edwards, MD;  Location: [facility];  Service: Gynecology;  Laterality: Bilateral;   Patient Active Problem List   Diagnosis Date Noted   Surgical menopause 09/28/2013   Uterus fibroma 02/02/2012   Condyloma acuminatum of vulva 01/28/2012   CIN II (cervical intraepithelial neoplasia II) 01/28/2012   Depression  01/28/2012   Anxiety 01/28/2012    PCP: Ralene Ok, MD  REFERRING PROVIDER: Ralene Ok, MD  REFERRING DIAG: [phone] (ICD-10-CM) - Right knee pain  THERAPY DIAG:  Chronic pain of right knee  Other low back pain  Muscle weakness (generalized)  Difficulty in walking, not elsewhere classified  Localized edema  Rationale for Evaluation and Treatment: Rehabilitation  ONSET DATE: Worsened in 08/2022   SUBJECTIVE STATEMENT: Pt indicated having lower back soreness and some knee soreness.  Pt indicated not noticing that exercises made the pain work.     PERTINENT HISTORY: DM2, HTN, hyperlipidemia, depression, anemia, obesity, Lt carpal tunnel syndrome.  History of back pain   PAIN:  NPRS scale: no pain at rest, at worst 4/10 Pain location: Rt knee  Pain description: swollen, tightness, throbbing Aggravating factors: stairs, transfers, WB Pressure, prolonged sitting, movement after sitting.  Relieving factors: trying to move some to help stiffness, resting in recliner.   PRECAUTIONS: None  WEIGHT BEARING RESTRICTIONS: No  FALLS:  Has patient fallen in last 6 months? No Mild fear of tripping  LIVING ENVIRONMENT: Lives in: House/apartment Stairs: 2 story house.  Lt rail going up.   OCCUPATION: Works at [employer] 10 hr day  (works at home)  PLOF:  Independent  PATIENT GOALS: Reduce pain, help back and knee.   OBJECTIVE: (objective measures completed at initial evaluation unless otherwise dated)   PATIENT SURVEYS:  01/07/2023 FOTO intake: 40   predicted:  54  COGNITION: 01/07/2023 Overall cognitive status: WFL    SENSATION: 01/07/2023 No limitations noted   EDEMA:  01/07/2023 Visual edema Rt knee   MUSCLE LENGTH: 01/07/2023 No specific testing today  POSTURE:  01/07/2023 Mild reduced lumbar lordosis in standing posture today.  Mild weight shift to Lt leg compared to Rt in standing.   PALPATION: 01/07/2023 Mild tenderness across lumbar    LUMBAR  ROM:   ROM AROM  01/07/2023 AROM 01/28/2023  Flexion To mid shin without complaints.    Extension 50% WFL in standing c mild end range symptoms  Repeated x 5 in standing with improvement to 75% 75% WFL  Right lateral flexion    Left lateral flexion    Right rotation    Left rotation     (Blank rows = not tested)    LOWER EXTREMITY ROM:   ROM Right 01/07/2023 Left 01/07/2023  Hip flexion    Hip extension    Hip abduction    Hip adduction    Hip internal rotation    Hip external rotation    Knee flexion 130 AROM   Knee extension 0 0  Ankle dorsiflexion    Ankle plantarflexion    Ankle inversion    Ankle eversion     (Blank rows = not tested)  LOWER EXTREMITY MMT:  MMT Right 01/07/2023 Left 01/07/2023  Hip flexion 5/5 5/5  Hip extension    Hip abduction    Hip adduction    Hip internal rotation    Hip external rotation    Knee flexion    Knee extension 5/5 c pain 34, 31 lbs 5/5 33.2, 31.4 lbs  Ankle dorsiflexion 5/5 5/5  Ankle plantarflexion    Ankle inversion    Ankle eversion     (Blank rows = not tested)  SPECIAL TESTS:  01/07/2023 (-) Slump bilateral   FUNCTIONAL TESTS:  01/07/2023 18 inch chair transfer:  attempt with hands on knee, mild shift to Lt  LE. Lt SLS:  < 3 seconds Rt SLS: < 3 seconds  GAIT: 01/07/2023 Independent ambulation c decreased stance on Rt leg, maintained knee flexion in stance.                                                                                                                                                                        TODAY'S TREATMENT  DATE: 01/28/2023 Therapeutic Exercise: Nustep Lvl 6 9 mins UE/LE Standing lumbar AROM extension x 10 Seated quad set c SLR 2 x 10 bilateral Supine lumbar trunk rotation stretch 15 sec x3 bilateral  Supine bridge 2-3 sec hold 2 x 10   Additional time spent in today's visit for review of HEP  techniques and symptom response monitoring and adjustment.  Summary was to avoid exercise to point of pain increase but muscle soreness was acceptable.   Sit to stand to sit from 22 inch chair without UE assist x 10 with slow lowering focus.     TODAY'S TREATMENT                                                                            DATE: 01/14/23 Therapeutic Exercise: LAQ seated x12 BIL cues for pacing and quad contraction Seated TKE x12 BIL cues for quad contraction Seated heel tap SLR x10 Bil cues for pacing and soft tap Hooklying march x6 BIL cues for core contraction and appropriate ROM Mini squat in // bars 2x10 cues for symmetry of WB, rest breaks for fatigue Heel raises x10 // bars cues for reduced fwd trunk lean compensation Verbal HEP review + education   TODAY'S TREATMENT                                                                            DATE:01/07/2023 Therex:    HEP instruction/performance c cues for techniques, handout provided.  Trial set performed of each for comprehension and symptom assessment.  See below for exercise list  Vaspneumatic  Rt knee in supine elevation 34 deg low compression 10 mins  PATIENT EDUCATION:  Education details: continued HEP education  Person educated: Patient Education method: Programmer, multimedia, Demonstration, Verbal cues, and Handouts Education comprehension: verbalized understanding, returned demonstration, and verbal cues required  HOME EXERCISE PROGRAM: Access Code: [email or web address] URL: [email or web address] Date: 01/07/2023 Prepared by: Chyrel Masson  Exercises - Standing Lumbar Extension with Counter  - 3-5 x daily - 7 x weekly - 1 sets - 5-10 reps - Supine Lower Trunk Rotation  - 3-5 x daily - 7 x weekly - 1 sets - 3-5 reps - 15 hold - Seated Long Arc Quad  - 3-5 x daily - 7 x weekly - 1-2 sets - 10 reps - 2 hold - Supine Bridge  - 1-2 x daily - 7 x weekly - 1-2 sets - 10 reps - 2 hold - Seated Quad Set  - 3-5 x  daily - 7 x weekly - 1 sets - 10 reps - 5 hold - Seated Straight Leg Heel Taps  - 1-2 x daily - 7 x weekly - 3 sets - 10 reps  ASSESSMENT:  CLINICAL IMPRESSION: Pt required additional time and effort in clinic to improve knowledge of techniques and symptom monitoring with exercise to improve ability to utilize HEP without pain exacerbation.  Recommend continued review as necessary with ultimate  focus on improved tolerance to activity to build endurance/strength for functional movement.    OBJECTIVE IMPAIRMENTS: Abnormal gait, decreased activity tolerance, decreased balance, decreased coordination, decreased endurance, decreased mobility, difficulty walking, decreased ROM, decreased strength, hypomobility, increased edema, impaired perceived functional ability, impaired flexibility, improper body mechanics, and pain.   ACTIVITY LIMITATIONS: carrying, lifting, bending, sitting, standing, squatting, stairs, transfers, and locomotion level  PARTICIPATION LIMITATIONS: meal prep, cleaning, laundry, interpersonal relationship, shopping, community activity, and occupation  PERSONAL FACTORS: Time since onset of injury/illness/exacerbation DM2, HTN, hyperlipidemia, depression, anemia, obesity, Lt carpal tunnel syndrome.  History of back painare also affecting patient's functional outcome.   REHAB POTENTIAL: Good  CLINICAL DECISION MAKING: Stable/uncomplicated  EVALUATION COMPLEXITY: Low   GOALS: Goals reviewed with patient? Yes  SHORT TERM GOALS: (target date for Short term goals are 3 weeks 01/28/2023)   1.  Patient will demonstrate independent use of home exercise program to maintain progress from in clinic treatments.  Goal status: on going 01/28/2023  LONG TERM GOALS: (target dates for all long term goals are 10 weeks  03/17/2023 )   1. Patient will demonstrate/report pain at worst less than or equal to 2/10 to facilitate minimal limitation in daily activity secondary to pain  symptoms.  Goal status: New   2. Patient will demonstrate independent use of home exercise program to facilitate ability to maintain/progress functional gains from skilled physical therapy services.  Goal status: New   3. Patient will demonstrate FOTO outcome > or = 54 % to indicate reduced disability due to condition.  Goal status: New   4.  Patient will demonstrate bilateral knee dynamometry extension > 45 lbs to faciltiate usual transfers, stairs, squatting at PLOF for daily life.   Goal status: New   5.  Patient will demonstrate ascending/descending stairs reciprocally with single rail assist for household navigation Goal status: New   6.  Patient will demonstrate bilateral SLS > 10 seconds to improve stability in ambulation.  Goal status: New   7.  Patient will demonstrate 100 % WFL lumbar extension s symptoms for standing/walking posture.  Goal Status: New   PLAN:  PT FREQUENCY: 1-2x/week  PT DURATION: 10 weeks  PLANNED INTERVENTIONS: Therapeutic exercises, Therapeutic activity, Neuro Muscular re-education, Balance training, Gait training, Patient/Family education, Joint mobilization, Stair training, DME instructions, Dry Needling, Electrical stimulation, Traction, Cryotherapy, vasopneumatic deviceMoist heat, Taping, Ultrasound, Ionotophoresis 4mg /ml Dexamethasone, and aquatic therapy, Manual therapy.  All included unless contraindicated  PLAN FOR NEXT SESSION: Continue for review of HEP as necessary.  Vaso PRN.  Progressive strengthening for BLEs.    Chyrel Masson, PT, DPT, OCS, ATC 01/28/23  12:22 PM

## 2023-01-28 NOTE — Telephone Encounter (Signed)
I called patient to offer earlier time slot for appointment on Friday 01/27/2023 due to changes in clinic schedule.  Left message that she would take the earlier spot if she desired or she could come at the originally scheduled time.  Chyrel Masson, PT, DPT, OCS, ATC 01/28/23  8:39 AM

## 2023-02-01 ENCOUNTER — Encounter: Payer: No Typology Code available for payment source | Admitting: Rehabilitative and Restorative Service Providers"

## 2023-02-04 ENCOUNTER — Encounter: Payer: Self-pay | Admitting: Rehabilitative and Restorative Service Providers"

## 2023-02-04 ENCOUNTER — Ambulatory Visit: Payer: No Typology Code available for payment source | Admitting: Rehabilitative and Restorative Service Providers"

## 2023-02-04 DIAGNOSIS — M25561 Pain in right knee: Secondary | ICD-10-CM

## 2023-02-04 DIAGNOSIS — R262 Difficulty in walking, not elsewhere classified: Secondary | ICD-10-CM

## 2023-02-04 DIAGNOSIS — R600 Localized edema: Secondary | ICD-10-CM

## 2023-02-04 DIAGNOSIS — M5459 Other low back pain: Secondary | ICD-10-CM | POA: Diagnosis not present

## 2023-02-04 DIAGNOSIS — M6281 Muscle weakness (generalized): Secondary | ICD-10-CM | POA: Diagnosis not present

## 2023-02-04 DIAGNOSIS — G8929 Other chronic pain: Secondary | ICD-10-CM

## 2023-02-04 NOTE — Therapy (Signed)
OUTPATIENT PHYSICAL THERAPY TREATMENT   Patient Name: Patricia Delacruz MRN: [id number] DOB:Nov 03, 1955, 67 y.o., female Today's Date: 02/04/2023  END OF SESSION:  PT End of Session - 02/04/23 1100     Visit Number 4    Number of Visits 20    Date for PT Re-Evaluation 03/18/23    Authorization Type [employer] $15 copay    Authorization - Number of Visits 25    Progress Note Due on Visit 10    PT Start Time 1059    PT Stop Time 1139    PT Time Calculation (min) 40 min    Activity Tolerance Patient tolerated treatment well    Behavior During Therapy WFL for tasks assessed/performed                Past Medical History:  Diagnosis Date   Anxiety    CIN II (cervical intraepithelial neoplasia II)    Complication of anesthesia    pt feels like "I can't breathe after surgery"   Condyloma acuminata    VULVAR   Depression    Diabetes mellitus    oral ACTOS   Headache(784.0)    occas   High risk HPV infection    History of kidney stones    Hypertension    Past Surgical History:  Procedure Laterality Date   CESAREAN SECTION  1990   CHOLECYSTECTOMY  2011   CO 2 LASER      CERVIX /VAGINA   COLONOSCOPY  NOV/03/2008   NEXT IN 10 YEARS   EXTRACORPOREAL SHOCK WAVE LITHOTRIPSY Right 10/09/2018   Procedure: EXTRACORPOREAL SHOCK WAVE LITHOTRIPSY (ESWL);  Surgeon: Crista Elliot, MD;  Location: [facility];  Service: Urology;  Laterality: Right;   LAPAROSCOPY  03/13/2012   Procedure: LAPAROSCOPY OPERATIVE;  Surgeon: Ok Edwards, MD;  Location: [facility];  Service: Gynecology;  Laterality: N/A;   SALPINGOOPHORECTOMY  03/13/2012   Procedure: SALPINGO OOPHERECTOMY;  Surgeon: Ok Edwards, MD;  Location: [facility];  Service: Gynecology;  Laterality: Bilateral;   Patient Active Problem List   Diagnosis Date Noted   Surgical menopause 09/28/2013   Uterus fibroma 02/02/2012   Condyloma acuminatum of vulva 01/28/2012   CIN II (cervical intraepithelial neoplasia II) 01/28/2012   Depression  01/28/2012   Anxiety 01/28/2012    PCP: Ralene Ok, MD  REFERRING PROVIDER: Ralene Ok, MD  REFERRING DIAG: [phone] (ICD-10-CM) - Right knee pain  THERAPY DIAG:  Chronic pain of right knee  Other low back pain  Muscle weakness (generalized)  Difficulty in walking, not elsewhere classified  Localized edema  Rationale for Evaluation and Treatment: Rehabilitation  ONSET DATE: Worsened in 08/2022   SUBJECTIVE STATEMENT: Pt indicated getting better use of HEP and noticing benefits from it.  Pt indicated feeling some days in the morning more stiff in back at times.  Reported no pain upon arrival.    PERTINENT HISTORY: DM2, HTN, hyperlipidemia, depression, anemia, obesity, Lt carpal tunnel syndrome.  History of back pain   PAIN:  NPRS scale: no pain at rest, at worst 4/10 Pain location: Rt knee  Pain description: swollen, tightness, throbbing Aggravating factors: stairs, transfers, WB Pressure, prolonged sitting, movement after sitting.  Relieving factors: trying to move some to help stiffness, resting in recliner.   PRECAUTIONS: None  WEIGHT BEARING RESTRICTIONS: No  FALLS:  Has patient fallen in last 6 months? No Mild fear of tripping  LIVING ENVIRONMENT: Lives in: House/apartment Stairs: 2 story house.  Lt rail going up.   OCCUPATION:  Works at [employer] 10 hr day  (works at home)  PLOF: Independent  PATIENT GOALS: Reduce pain, help back and knee.   OBJECTIVE: (objective measures completed at initial evaluation unless otherwise dated)   PATIENT SURVEYS:  01/07/2023 FOTO intake: 40   predicted:  54  COGNITION: 01/07/2023 Overall cognitive status: WFL    SENSATION: 01/07/2023 No limitations noted   EDEMA:  01/07/2023 Visual edema Rt knee   MUSCLE LENGTH: 01/07/2023 No specific testing today  POSTURE:  01/07/2023 Mild reduced lumbar lordosis in standing posture today.  Mild weight shift to Lt leg compared to Rt in standing.    PALPATION: 01/07/2023 Mild tenderness across lumbar    LUMBAR ROM:   ROM AROM  01/07/2023 AROM 01/28/2023 AROM 02/04/2023  Flexion To mid shin without complaints.     Extension 50% WFL in standing c mild end range symptoms  Repeated x 5 in standing with improvement to 75% 75% WFL 75% WFL without complaints.   Right lateral flexion     Left lateral flexion     Right rotation     Left rotation      (Blank rows = not tested)    LOWER EXTREMITY ROM:   ROM Right 01/07/2023 Left 01/07/2023  Hip flexion    Hip extension    Hip abduction    Hip adduction    Hip internal rotation    Hip external rotation    Knee flexion 130 AROM   Knee extension 0 0  Ankle dorsiflexion    Ankle plantarflexion    Ankle inversion    Ankle eversion     (Blank rows = not tested)  LOWER EXTREMITY MMT:  MMT Right 01/07/2023 Left 01/07/2023  Hip flexion 5/5 5/5  Hip extension    Hip abduction    Hip adduction    Hip internal rotation    Hip external rotation    Knee flexion    Knee extension 5/5 c pain 34, 31 lbs 5/5 33.2, 31.4 lbs  Ankle dorsiflexion 5/5 5/5  Ankle plantarflexion    Ankle inversion    Ankle eversion     (Blank rows = not tested)  SPECIAL TESTS:  01/07/2023 (-) Slump bilateral   FUNCTIONAL TESTS:  01/07/2023 18 inch chair transfer:  attempt with hands on knee, mild shift to Lt  LE. Lt SLS:  < 3 seconds Rt SLS: < 3 seconds  GAIT: 01/07/2023 Independent ambulation c decreased stance on Rt leg, maintained knee flexion in stance.                                                                                                                                                                        TODAY'S TREATMENT  DATE: 02/04/2023 Therapeutic Exercise: Nustep Lvl 6 10 mins UE/LE Leg press double leg x 15 50 lbs, single leg x 15 25 lbs  Standing lumbar AROM extension x 10 Supine lumbar trunk  rotation stretch 15 sec x5 bilateral  Supine bridge 2-3 sec hold 2 x 10  Supine clam shell blue  band x 15 bilateral   TODAY'S TREATMENT                                                                            DATE: 01/28/2023 Therapeutic Exercise: Nustep Lvl 6 9 mins UE/LE Standing lumbar AROM extension x 10 Seated quad set c SLR 2 x 10 bilateral Supine lumbar trunk rotation stretch 15 sec x3 bilateral  Supine bridge 2-3 sec hold 2 x 10   Additional time spent in today's visit for review of HEP techniques and symptom response monitoring and adjustment.  Summary was to avoid exercise to point of pain increase but muscle soreness was acceptable.   Sit to stand to sit from 22 inch chair without UE assist x 10 with slow lowering focus.     TODAY'S TREATMENT                                                                            DATE: 01/14/23 Therapeutic Exercise: LAQ seated x12 BIL cues for pacing and quad contraction Seated TKE x12 BIL cues for quad contraction Seated heel tap SLR x10 Bil cues for pacing and soft tap Hooklying march x6 BIL cues for core contraction and appropriate ROM Mini squat in // bars 2x10 cues for symmetry of WB, rest breaks for fatigue Heel raises x10 // bars cues for reduced fwd trunk lean compensation Verbal HEP review + education   PATIENT EDUCATION:  Education details: continued HEP education  Person educated: Patient Education method: Programmer, multimedia, Demonstration, Verbal cues, and Handouts Education comprehension: verbalized understanding, returned demonstration, and verbal cues required  HOME EXERCISE PROGRAM: Access Code: [email or web address] URL: [email or web address] Date: 01/07/2023 Prepared by: Chyrel Masson  Exercises - Standing Lumbar Extension with Counter  - 3-5 x daily - 7 x weekly - 1 sets - 5-10 reps - Supine Lower Trunk Rotation  - 3-5 x daily - 7 x weekly - 1 sets - 3-5 reps - 15 hold - Seated Long Arc Quad  - 3-5 x daily - 7  x weekly - 1-2 sets - 10 reps - 2 hold - Supine Bridge  - 1-2 x daily - 7 x weekly - 1-2 sets - 10 reps - 2 hold - Seated Quad Set  - 3-5 x daily - 7 x weekly - 1 sets - 10 reps - 5 hold - Seated Straight Leg Heel Taps  - 1-2 x daily - 7 x weekly - 3 sets - 10 reps  ASSESSMENT:  CLINICAL IMPRESSION: Pt going out of town to [place] with air travel.  Cues given to encourage movement on plane, use  of lumbar extension and knee ROM to reduce general tightness from inactivity.  Plan to reassess response to activity and travel   OBJECTIVE IMPAIRMENTS: Abnormal gait, decreased activity tolerance, decreased balance, decreased coordination, decreased endurance, decreased mobility, difficulty walking, decreased ROM, decreased strength, hypomobility, increased edema, impaired perceived functional ability, impaired flexibility, improper body mechanics, and pain.   ACTIVITY LIMITATIONS: carrying, lifting, bending, sitting, standing, squatting, stairs, transfers, and locomotion level  PARTICIPATION LIMITATIONS: meal prep, cleaning, laundry, interpersonal relationship, shopping, community activity, and occupation  PERSONAL FACTORS: Time since onset of injury/illness/exacerbation DM2, HTN, hyperlipidemia, depression, anemia, obesity, Lt carpal tunnel syndrome.  History of back painare also affecting patient's functional outcome.   REHAB POTENTIAL: Good  CLINICAL DECISION MAKING: Stable/uncomplicated  EVALUATION COMPLEXITY: Low   GOALS: Goals reviewed with patient? Yes  SHORT TERM GOALS: (target date for Short term goals are 3 weeks 01/28/2023)   1.  Patient will demonstrate independent use of home exercise program to maintain progress from in clinic treatments.  Goal status: met  LONG TERM GOALS: (target dates for all long term goals are 10 weeks  03/17/2023 )   1. Patient will demonstrate/report pain at worst less than or equal to 2/10 to facilitate minimal limitation in daily activity secondary  to pain symptoms.  Goal status: on going 02/04/2023   2. Patient will demonstrate independent use of home exercise program to facilitate ability to maintain/progress functional gains from skilled physical therapy services.  Goal status: on going 02/04/2023   3. Patient will demonstrate FOTO outcome > or = 54 % to indicate reduced disability due to condition.  Goal status: on going 02/04/2023   4.  Patient will demonstrate bilateral knee dynamometry extension > 45 lbs to faciltiate usual transfers, stairs, squatting at PLOF for daily life.   Goal status: on going 02/04/2023   5.  Patient will demonstrate ascending/descending stairs reciprocally with single rail assist for household navigation Goal status: on going 02/04/2023   6.  Patient will demonstrate bilateral SLS > 10 seconds to improve stability in ambulation.  Goal status: on going 02/04/2023   7.  Patient will demonstrate 100 % WFL lumbar extension s symptoms for standing/walking posture.  Goal Status: on going 02/04/2023   PLAN:  PT FREQUENCY: 1-2x/week  PT DURATION: 10 weeks  PLANNED INTERVENTIONS: Therapeutic exercises, Therapeutic activity, Neuro Muscular re-education, Balance training, Gait training, Patient/Family education, Joint mobilization, Stair training, DME instructions, Dry Needling, Electrical stimulation, Traction, Cryotherapy, vasopneumatic deviceMoist heat, Taping, Ultrasound, Ionotophoresis 4mg /ml Dexamethasone, and aquatic therapy, Manual therapy.  All included unless contraindicated  PLAN FOR NEXT SESSION: FOTO reassessment, strength recheck.    Chyrel Masson, PT, DPT, OCS, ATC 02/04/23  11:38 AM

## 2023-02-18 ENCOUNTER — Encounter: Payer: Self-pay | Admitting: Rehabilitative and Restorative Service Providers"

## 2023-02-18 ENCOUNTER — Ambulatory Visit: Payer: No Typology Code available for payment source | Admitting: Rehabilitative and Restorative Service Providers"

## 2023-02-18 DIAGNOSIS — G8929 Other chronic pain: Secondary | ICD-10-CM

## 2023-02-18 DIAGNOSIS — R600 Localized edema: Secondary | ICD-10-CM

## 2023-02-18 DIAGNOSIS — M6281 Muscle weakness (generalized): Secondary | ICD-10-CM

## 2023-02-18 DIAGNOSIS — M25561 Pain in right knee: Secondary | ICD-10-CM

## 2023-02-18 DIAGNOSIS — R262 Difficulty in walking, not elsewhere classified: Secondary | ICD-10-CM

## 2023-02-18 DIAGNOSIS — M5459 Other low back pain: Secondary | ICD-10-CM | POA: Diagnosis not present

## 2023-02-18 NOTE — Therapy (Signed)
OUTPATIENT PHYSICAL THERAPY TREATMENT   Patient Name: Patricia Delacruz MRN: [id number] DOB:[birth year], 67 y.o., female Today's Date: 02/18/2023  END OF SESSION:  PT End of Session - 02/18/23 1121     Visit Number 5    Number of Visits 20    Date for PT Re-Evaluation 03/18/23    Authorization Type [employer] $15 copay    Authorization - Number of Visits 25    Progress Note Due on Visit 10    PT Start Time 1051    PT Stop Time 1134    PT Time Calculation (min) 43 min    Activity Tolerance Patient tolerated treatment well    Behavior During Therapy WFL for tasks assessed/performed                 Past Medical History:  Diagnosis Date   Anxiety    CIN II (cervical intraepithelial neoplasia II)    Complication of anesthesia    pt feels like "I can't breathe after surgery"   Condyloma acuminata    VULVAR   Depression    Diabetes mellitus    oral ACTOS   Headache(784.0)    occas   High risk HPV infection    History of kidney stones    Hypertension    Past Surgical History:  Procedure Laterality Date   CESAREAN SECTION  1990   CHOLECYSTECTOMY  2011   CO 2 LASER      CERVIX /VAGINA   COLONOSCOPY  NOV/03/2008   NEXT IN 10 YEARS   EXTRACORPOREAL SHOCK WAVE LITHOTRIPSY Right 10/09/2018   Procedure: EXTRACORPOREAL SHOCK WAVE LITHOTRIPSY (ESWL);  Surgeon: Crista Elliot, MD;  Location: [facility];  Service: Urology;  Laterality: Right;   LAPAROSCOPY  03/13/2012   Procedure: LAPAROSCOPY OPERATIVE;  Surgeon: Ok Edwards, MD;  Location: [facility];  Service: Gynecology;  Laterality: N/A;   SALPINGOOPHORECTOMY  03/13/2012   Procedure: SALPINGO OOPHERECTOMY;  Surgeon: Ok Edwards, MD;  Location: [facility];  Service: Gynecology;  Laterality: Bilateral;   Patient Active Problem List   Diagnosis Date Noted   Surgical menopause 09/28/2013   Uterus fibroma 02/02/2012   Condyloma acuminatum of vulva 01/28/2012   CIN II (cervical intraepithelial neoplasia II) 01/28/2012   Depression  01/28/2012   Anxiety 01/28/2012    PCP: Ralene Ok, MD  REFERRING PROVIDER: Ralene Ok, MD  REFERRING DIAG: [phone] (ICD-10-CM) - Right knee pain  THERAPY DIAG:  Chronic pain of right knee  Other low back pain  Muscle weakness (generalized)  Difficulty in walking, not elsewhere classified  Localized edema  Rationale for Evaluation and Treatment: Rehabilitation  ONSET DATE: Worsened in 08/2022   SUBJECTIVE STATEMENT: Pt was out of town on trip since visit 2 weeks ago.  She reported fall on knees on the 02/05/2023 while on trip.  Fell off bed and hit Rt side.  Pt indicated having some pain in knees and hit head.  She reported feeling fine with activity after fall but the travel back seemed to create more trouble when getting home.  Reported some pain soreness in back Rt knee.    PERTINENT HISTORY: DM2, HTN, hyperlipidemia, depression, anemia, obesity, Lt carpal tunnel syndrome.  History of back pain   PAIN:  NPRS scale: no pain at rest, at worst 4/10 Pain location: Rt knee  Pain description: swollen, tightness, throbbing Aggravating factors: stairs, transfers, WB Pressure, prolonged sitting, movement after sitting.  Relieving factors: trying to move some to help stiffness, resting in recliner.  PRECAUTIONS: None  WEIGHT BEARING RESTRICTIONS: No  FALLS:  Has patient fallen in last 6 months? No Mild fear of tripping  LIVING ENVIRONMENT: Lives in: House/apartment Stairs: 2 story house.  Lt rail going up.   OCCUPATION: Works at [employer] 10 hr day  (works at home)  PLOF: Independent  PATIENT GOALS: Reduce pain, help back and knee.   OBJECTIVE: (objective measures completed at initial evaluation unless otherwise dated)   PATIENT SURVEYS:  02/18/2023:  FOTO reassessment 47  01/07/2023 FOTO intake: 40   predicted:  54  COGNITION: 01/07/2023 Overall cognitive status: WFL    SENSATION: 01/07/2023 No limitations noted   EDEMA:  01/07/2023 Visual edema Rt  knee   MUSCLE LENGTH: 01/07/2023 No specific testing today  POSTURE:  01/07/2023 Mild reduced lumbar lordosis in standing posture today.  Mild weight shift to Lt leg compared to Rt in standing.   PALPATION: 01/07/2023 Mild tenderness across lumbar    LUMBAR ROM:   ROM AROM  01/07/2023 AROM 01/28/2023 AROM 02/04/2023 AROM 02/18/2023  Flexion To mid shin without complaints.      Extension 50% WFL in standing c mild end range symptoms  Repeated x 5 in standing with improvement to 75% 75% WFL 75% WFL without complaints.  75 % on first rep.  X 5 in standing : 100 %   Right lateral flexion      Left lateral flexion      Right rotation      Left rotation       (Blank rows = not tested)    LOWER EXTREMITY ROM:   ROM Right 01/07/2023 Left 01/07/2023  Hip flexion    Hip extension    Hip abduction    Hip adduction    Hip internal rotation    Hip external rotation    Knee flexion 130 AROM   Knee extension 0 0  Ankle dorsiflexion    Ankle plantarflexion    Ankle inversion    Ankle eversion     (Blank rows = not tested)  LOWER EXTREMITY MMT:  MMT Right 01/07/2023 Left 01/07/2023 Right 02/18/2023 Left 02/18/2023  Hip flexion 5/5 5/5    Hip extension      Hip abduction      Hip adduction      Hip internal rotation      Hip external rotation      Knee flexion      Knee extension 5/5 c pain 34, 31 lbs 5/5 33.2, 31.4 lbs 5/5  41, 40.3 lbs 5/5  36, 36.7 lbs  Ankle dorsiflexion 5/5 5/5    Ankle plantarflexion      Ankle inversion      Ankle eversion       (Blank rows = not tested)  SPECIAL TESTS:  01/07/2023 (-) Slump bilateral   FUNCTIONAL TESTS:  01/07/2023 18 inch chair transfer:  attempt with hands on knee, mild shift to Lt  LE. Lt SLS:  < 3 seconds Rt SLS: < 3 seconds  GAIT: 01/07/2023 Independent ambulation c decreased stance on Rt leg, maintained knee flexion in stance.  TODAY'S TREATMENT                                                                            DATE: 02/18/2023 Therapeutic Exercise: Nustep Lvl 6 12 mins UE/LE Leg press double leg x 15 62 lbs, single leg 2x 15 25 lbs  Seated SLR x 15 bilateral with slow lowering focus  Lumbar extension AROM x 5   Verbal review of importance of consistency in HEP as long as it didn't make any pain complaints worse in the moment.  Additional time spent in review.     TODAY'S TREATMENT                                                                            DATE: 02/04/2023 Therapeutic Exercise: Nustep Lvl 6 10 mins UE/LE Leg press double leg x 15 50 lbs, single leg x 15 25 lbs  Standing lumbar AROM extension x 10 Supine lumbar trunk rotation stretch 15 sec x5 bilateral  Supine bridge 2-3 sec hold 2 x 10  Supine clam shell blue  band x 15 bilateral   TODAY'S TREATMENT                                                                            DATE: 01/28/2023 Therapeutic Exercise: Nustep Lvl 6 9 mins UE/LE Standing lumbar AROM extension x 10 Seated quad set c SLR 2 x 10 bilateral Supine lumbar trunk rotation stretch 15 sec x3 bilateral  Supine bridge 2-3 sec hold 2 x 10   Additional time spent in today's visit for review of HEP techniques and symptom response monitoring and adjustment.  Summary was to avoid exercise to point of pain increase but muscle soreness was acceptable.   Sit to stand to sit from 22 inch chair without UE assist x 10 with slow lowering focus.     TODAY'S TREATMENT                                                                            DATE: 01/14/23 Therapeutic Exercise: LAQ seated x12 BIL cues for pacing and quad contraction Seated TKE x12 BIL cues for quad contraction Seated heel tap SLR x10 Bil cues for pacing and soft tap Hooklying march x6 BIL cues for core contraction and appropriate  ROM Mini squat in // bars 2x10 cues for symmetry of  WB, rest breaks for fatigue Heel raises x10 // bars cues for reduced fwd trunk lean compensation Verbal HEP review + education   PATIENT EDUCATION:  Education details: continued HEP education  Person educated: Patient Education method: Programmer, multimedia, Demonstration, Verbal cues, and Handouts Education comprehension: verbalized understanding, returned demonstration, and verbal cues required  HOME EXERCISE PROGRAM: Access Code: [email or web address] URL: [email or web address] Date: 01/07/2023 Prepared by: Chyrel Masson  Exercises - Standing Lumbar Extension with Counter  - 3-5 x daily - 7 x weekly - 1 sets - 5-10 reps - Supine Lower Trunk Rotation  - 3-5 x daily - 7 x weekly - 1 sets - 3-5 reps - 15 hold - Seated Long Arc Quad  - 3-5 x daily - 7 x weekly - 1-2 sets - 10 reps - 2 hold - Supine Bridge  - 1-2 x daily - 7 x weekly - 1-2 sets - 10 reps - 2 hold - Seated Quad Set  - 3-5 x daily - 7 x weekly - 1 sets - 10 reps - 5 hold - Seated Straight Leg Heel Taps  - 1-2 x daily - 7 x weekly - 3 sets - 10 reps  ASSESSMENT:  CLINICAL IMPRESSION: Pt returned with variable complaint reported from trip with summary showed some worsened symptoms post increased WB activity and travel.  Pt was able to do increased walking without pain in the moment which was positive.  FOTO and dynamometry showed mild improvement compared to eval.  Pt to benefit from continued strengthening gains for LE improvement in functional activity.    OBJECTIVE IMPAIRMENTS: Abnormal gait, decreased activity tolerance, decreased balance, decreased coordination, decreased endurance, decreased mobility, difficulty walking, decreased ROM, decreased strength, hypomobility, increased edema, impaired perceived functional ability, impaired flexibility, improper body mechanics, and pain.   ACTIVITY LIMITATIONS: carrying, lifting, bending, sitting, standing, squatting, stairs,  transfers, and locomotion level  PARTICIPATION LIMITATIONS: meal prep, cleaning, laundry, interpersonal relationship, shopping, community activity, and occupation  PERSONAL FACTORS: Time since onset of injury/illness/exacerbation DM2, HTN, hyperlipidemia, depression, anemia, obesity, Lt carpal tunnel syndrome.  History of back painare also affecting patient's functional outcome.   REHAB POTENTIAL: Good  CLINICAL DECISION MAKING: Stable/uncomplicated  EVALUATION COMPLEXITY: Low   GOALS: Goals reviewed with patient? Yes  SHORT TERM GOALS: (target date for Short term goals are 3 weeks 01/28/2023)   1.  Patient will demonstrate independent use of home exercise program to maintain progress from in clinic treatments.  Goal status: met  LONG TERM GOALS: (target dates for all long term goals are 10 weeks  03/17/2023 )   1. Patient will demonstrate/report pain at worst less than or equal to 2/10 to facilitate minimal limitation in daily activity secondary to pain symptoms.  Goal status: on going 02/04/2023   2. Patient will demonstrate independent use of home exercise program to facilitate ability to maintain/progress functional gains from skilled physical therapy services.  Goal status: on going 02/04/2023   3. Patient will demonstrate FOTO outcome > or = 54 % to indicate reduced disability due to condition.  Goal status: on going 02/04/2023   4.  Patient will demonstrate bilateral knee dynamometry extension > 45 lbs to faciltiate usual transfers, stairs, squatting at PLOF for daily life.   Goal status: on going 02/04/2023   5.  Patient will demonstrate ascending/descending stairs reciprocally with single rail assist for household navigation Goal status: on going 02/04/2023   6.  Patient will demonstrate bilateral SLS > 10 seconds to improve stability in ambulation.  Goal status: on going 02/04/2023   7.  Patient will demonstrate 100 % WFL lumbar extension s symptoms for standing/walking  posture.  Goal Status: on going 02/04/2023   PLAN:  PT FREQUENCY: 1-2x/week  PT DURATION: 10 weeks  PLANNED INTERVENTIONS: Therapeutic exercises, Therapeutic activity, Neuro Muscular re-education, Balance training, Gait training, Patient/Family education, Joint mobilization, Stair training, DME instructions, Dry Needling, Electrical stimulation, Traction, Cryotherapy, vasopneumatic deviceMoist heat, Taping, Ultrasound, Ionotophoresis 4mg /ml Dexamethasone, and aquatic therapy, Manual therapy.  All included unless contraindicated  PLAN FOR NEXT SESSION: Continue to improve LE strength and functional WB control/strength.    Chyrel Masson, PT, DPT, OCS, ATC 02/18/23  11:33 AM

## 2023-02-22 ENCOUNTER — Ambulatory Visit: Payer: No Typology Code available for payment source | Admitting: Rehabilitative and Restorative Service Providers"

## 2023-02-22 ENCOUNTER — Encounter: Payer: Self-pay | Admitting: Rehabilitative and Restorative Service Providers"

## 2023-02-22 DIAGNOSIS — M25561 Pain in right knee: Secondary | ICD-10-CM

## 2023-02-22 DIAGNOSIS — G8929 Other chronic pain: Secondary | ICD-10-CM

## 2023-02-22 DIAGNOSIS — M6281 Muscle weakness (generalized): Secondary | ICD-10-CM | POA: Diagnosis not present

## 2023-02-22 DIAGNOSIS — M5459 Other low back pain: Secondary | ICD-10-CM | POA: Diagnosis not present

## 2023-02-22 DIAGNOSIS — R262 Difficulty in walking, not elsewhere classified: Secondary | ICD-10-CM

## 2023-02-22 DIAGNOSIS — R600 Localized edema: Secondary | ICD-10-CM

## 2023-02-22 NOTE — Therapy (Addendum)
OUTPATIENT PHYSICAL THERAPY TREATMENT / DISCHARGE   Patient Name: Patricia Delacruz MRN: [id number] DOB:1955-08-05, 67 y.o., female Today's Date: 02/22/2023  END OF SESSION:  PT End of Session - 02/22/23 1313     Visit Number 6    Number of Visits 20    Date for PT Re-Evaluation 03/18/23    Authorization Type [employer] $15 copay    Authorization - Number of Visits 25    Progress Note Due on Visit 10    PT Start Time 1304    PT Stop Time 1343    PT Time Calculation (min) 39 min    Activity Tolerance Patient tolerated treatment well    Behavior During Therapy WFL for tasks assessed/performed                  Past Medical History:  Diagnosis Date   Anxiety    CIN II (cervical intraepithelial neoplasia II)    Complication of anesthesia    pt feels like "I can't breathe after surgery"   Condyloma acuminata    VULVAR   Depression    Diabetes mellitus    oral ACTOS   Headache(784.0)    occas   High risk HPV infection    History of kidney stones    Hypertension    Past Surgical History:  Procedure Laterality Date   CESAREAN SECTION  1990   CHOLECYSTECTOMY  2011   CO 2 LASER      CERVIX /VAGINA   COLONOSCOPY  NOV/03/2008   NEXT IN 10 YEARS   EXTRACORPOREAL SHOCK WAVE LITHOTRIPSY Right 10/09/2018   Procedure: EXTRACORPOREAL SHOCK WAVE LITHOTRIPSY (ESWL);  Surgeon: Crista Elliot, MD;  Location: [facility];  Service: Urology;  Laterality: Right;   LAPAROSCOPY  03/13/2012   Procedure: LAPAROSCOPY OPERATIVE;  Surgeon: Ok Edwards, MD;  Location: [facility];  Service: Gynecology;  Laterality: N/A;   SALPINGOOPHORECTOMY  03/13/2012   Procedure: SALPINGO OOPHERECTOMY;  Surgeon: Ok Edwards, MD;  Location: [facility];  Service: Gynecology;  Laterality: Bilateral;   Patient Active Problem List   Diagnosis Date Noted   Surgical menopause 09/28/2013   Uterus fibroma 02/02/2012   Condyloma acuminatum of vulva 01/28/2012   CIN II (cervical intraepithelial neoplasia II) 01/28/2012    Depression 01/28/2012   Anxiety 01/28/2012    PCP: Ralene Ok, MD  REFERRING PROVIDER: Ralene Ok, MD  REFERRING DIAG: [phone] (ICD-10-CM) - Right knee pain  THERAPY DIAG:  Chronic pain of right knee  Other low back pain  Muscle weakness (generalized)  Difficulty in walking, not elsewhere classified  Localized edema  Rationale for Evaluation and Treatment: Rehabilitation  ONSET DATE: Worsened in 08/2022   SUBJECTIVE STATEMENT: Pt indicated having soreness in knee and has been sitting for several hours. 4-5/10 reported. No pain other body parts upon arrival today.    PERTINENT HISTORY: DM2, HTN, hyperlipidemia, depression, anemia, obesity, Lt carpal tunnel syndrome.  History of back pain   PAIN:  NPRS scale: no pain at rest, at worst 4/10 Pain location: Rt knee  Pain description: swollen, tightness, throbbing Aggravating factors: stairs, transfers, WB Pressure, prolonged sitting, movement after sitting.  Relieving factors: trying to move some to help stiffness, resting in recliner.   PRECAUTIONS: None  WEIGHT BEARING RESTRICTIONS: No  FALLS:  Has patient fallen in last 6 months? No Mild fear of tripping  LIVING ENVIRONMENT: Lives in: House/apartment Stairs: 2 story house.  Lt rail going up.   OCCUPATION: Works at [employer] 10 hr day  (  works at home)  PLOF: Independent  PATIENT GOALS: Reduce pain, help back and knee.   OBJECTIVE: (objective measures completed at initial evaluation unless otherwise dated)   PATIENT SURVEYS:  02/18/2023:  FOTO reassessment 47  01/07/2023 FOTO intake: 40   predicted:  54  COGNITION: 01/07/2023 Overall cognitive status: WFL    SENSATION: 01/07/2023 No limitations noted   EDEMA:  01/07/2023 Visual edema Rt knee   MUSCLE LENGTH: 01/07/2023 No specific testing today  POSTURE:  01/07/2023 Mild reduced lumbar lordosis in standing posture today.  Mild weight shift to Lt leg compared to Rt in standing.    PALPATION: 01/07/2023 Mild tenderness across lumbar    LUMBAR ROM:   ROM AROM  01/07/2023 AROM 01/28/2023 AROM 02/04/2023 AROM 02/18/2023  Flexion To mid shin without complaints.      Extension 50% WFL in standing c mild end range symptoms  Repeated x 5 in standing with improvement to 75% 75% WFL 75% WFL without complaints.  75 % on first rep.  X 5 in standing : 100 %   Right lateral flexion      Left lateral flexion      Right rotation      Left rotation       (Blank rows = not tested)    LOWER EXTREMITY ROM:   ROM Right 01/07/2023 Left 01/07/2023  Hip flexion    Hip extension    Hip abduction    Hip adduction    Hip internal rotation    Hip external rotation    Knee flexion 130 AROM   Knee extension 0 0  Ankle dorsiflexion    Ankle plantarflexion    Ankle inversion    Ankle eversion     (Blank rows = not tested)  LOWER EXTREMITY MMT:  MMT Right 01/07/2023 Left 01/07/2023 Right 02/18/2023 Left 02/18/2023  Hip flexion 5/5 5/5    Hip extension      Hip abduction      Hip adduction      Hip internal rotation      Hip external rotation      Knee flexion      Knee extension 5/5 c pain 34, 31 lbs 5/5 33.2, 31.4 lbs 5/5  41, 40.3 lbs 5/5  36, 36.7 lbs  Ankle dorsiflexion 5/5 5/5    Ankle plantarflexion      Ankle inversion      Ankle eversion       (Blank rows = not tested)  SPECIAL TESTS:  01/07/2023 (-) Slump bilateral   FUNCTIONAL TESTS:  01/07/2023 18 inch chair transfer:  attempt with hands on knee, mild shift to Lt  LE. Lt SLS:  < 3 seconds Rt SLS: < 3 seconds  GAIT: 01/07/2023 Independent ambulation c decreased stance on Rt leg, maintained knee flexion in stance.  TODAY'S TREATMENT                                                                            DATE:  02/22/2023 Therapeutic Exercise: Nustep Lvl 6 12 mins UE/LE Leg press double leg x 15 67 lbs, single leg 2x 15 25 lbs  Lateral step down 4 inch step x 15 bilateral with light hand touch on bar Sit to stand to sit from 18 inch chair with foam no UE assist x 10   Neuro Re-ed Tandem stance 1 min x 1 bilateral on floor , 1 min x 1 bilateral on airex pad Tandem ambulation on foam pad in // bars with occasional to moderate HHA 6 ft x 6 each way   TODAY'S TREATMENT                                                                            DATE: 02/18/2023 Therapeutic Exercise: Nustep Lvl 6 12 mins UE/LE Leg press double leg x 15 62 lbs, single leg 2x 15 25 lbs  Seated SLR x 15 bilateral with slow lowering focus  Lumbar extension AROM x 5   Verbal review of importance of consistency in HEP as long as it didn't make any pain complaints worse in the moment.  Additional time spent in review.     TODAY'S TREATMENT                                                                            DATE: 02/04/2023 Therapeutic Exercise: Nustep Lvl 6 10 mins UE/LE Leg press double leg x 15 50 lbs, single leg x 15 25 lbs  Standing lumbar AROM extension x 10 Supine lumbar trunk rotation stretch 15 sec x5 bilateral  Supine bridge 2-3 sec hold 2 x 10  Supine clam shell blue  band x 15 bilateral  PATIENT EDUCATION:  Education details: continued HEP education  Person educated: Patient Education method: Programmer, multimedia, Facilities manager, Verbal cues, and Handouts Education comprehension: verbalized understanding, returned demonstration, and verbal cues required  HOME EXERCISE PROGRAM: Access Code: [email or web address] URL: [email or web address] Date: 01/07/2023 Prepared by: Chyrel Masson  Exercises - Standing Lumbar Extension with Counter  - 3-5 x daily - 7 x weekly - 1 sets - 5-10 reps - Supine Lower Trunk Rotation  - 3-5 x daily - 7 x weekly - 1 sets - 3-5 reps - 15 hold - Seated Long Arc Quad  - 3-5 x  daily - 7 x weekly - 1-2 sets - 10 reps - 2 hold - Supine Bridge  - 1-2 x daily - 7 x weekly - 1-2 sets - 10 reps - 2 hold -  Seated Quad Set  - 3-5 x daily - 7 x weekly - 1 sets - 10 reps - 5 hold - Seated Straight Leg Heel Taps  - 1-2 x daily - 7 x weekly - 3 sets - 10 reps  ASSESSMENT:  CLINICAL IMPRESSION: Continued with plan for progressive strengthening and introduction of compliant surface balance with fair performance noted on balance interventions with occasional to moderate HHA required to prevent loss of balance in // bars.  Continued skilled PT services warranted at this time.    OBJECTIVE IMPAIRMENTS: Abnormal gait, decreased activity tolerance, decreased balance, decreased coordination, decreased endurance, decreased mobility, difficulty walking, decreased ROM, decreased strength, hypomobility, increased edema, impaired perceived functional ability, impaired flexibility, improper body mechanics, and pain.   ACTIVITY LIMITATIONS: carrying, lifting, bending, sitting, standing, squatting, stairs, transfers, and locomotion level  PARTICIPATION LIMITATIONS: meal prep, cleaning, laundry, interpersonal relationship, shopping, community activity, and occupation  PERSONAL FACTORS: Time since onset of injury/illness/exacerbation DM2, HTN, hyperlipidemia, depression, anemia, obesity, Lt carpal tunnel syndrome.  History of back painare also affecting patient's functional outcome.   REHAB POTENTIAL: Good  CLINICAL DECISION MAKING: Stable/uncomplicated  EVALUATION COMPLEXITY: Low   GOALS: Goals reviewed with patient? Yes  SHORT TERM GOALS: (target date for Short term goals are 3 weeks 01/28/2023)   1.  Patient will demonstrate independent use of home exercise program to maintain progress from in clinic treatments.  Goal status: met  LONG TERM GOALS: (target dates for all long term goals are 10 weeks  03/17/2023 )   1. Patient will demonstrate/report pain at worst less than or equal  to 2/10 to facilitate minimal limitation in daily activity secondary to pain symptoms.  Goal status: on going 02/04/2023   2. Patient will demonstrate independent use of home exercise program to facilitate ability to maintain/progress functional gains from skilled physical therapy services.  Goal status: on going 02/04/2023   3. Patient will demonstrate FOTO outcome > or = 54 % to indicate reduced disability due to condition.  Goal status: on going 02/04/2023   4.  Patient will demonstrate bilateral knee dynamometry extension > 45 lbs to faciltiate usual transfers, stairs, squatting at PLOF for daily life.   Goal status: on going 02/04/2023   5.  Patient will demonstrate ascending/descending stairs reciprocally with single rail assist for household navigation Goal status: on going 02/04/2023   6.  Patient will demonstrate bilateral SLS > 10 seconds to improve stability in ambulation.  Goal status: on going 02/04/2023   7.  Patient will demonstrate 100 % WFL lumbar extension s symptoms for standing/walking posture.  Goal Status: on going 02/04/2023   PLAN:  PT FREQUENCY: 1-2x/week  PT DURATION: 10 weeks  PLANNED INTERVENTIONS: Therapeutic exercises, Therapeutic activity, Neuro Muscular re-education, Balance training, Gait training, Patient/Family education, Joint mobilization, Stair training, DME instructions, Dry Needling, Electrical stimulation, Traction, Cryotherapy, vasopneumatic deviceMoist heat, Taping, Ultrasound, Ionotophoresis 4mg /ml Dexamethasone, and aquatic therapy, Manual therapy.  All included unless contraindicated  PLAN FOR NEXT SESSION: Compliant surface balance, strengthening as able.    Chyrel Masson, PT, DPT, OCS, ATC 02/22/23  1:43 PM   PHYSICAL THERAPY DISCHARGE SUMMARY  Visits from Start of Care: 6  Current functional level related to goals / functional outcomes: See note   Remaining deficits: See note   Education / Equipment: HEP  Patient goals  were partially met. Patient is being discharged due to not returning since the last visit.  Chyrel Masson, PT, DPT, OCS, ATC 04/18/23  3:21 PM

## 2023-02-25 ENCOUNTER — Encounter: Payer: No Typology Code available for payment source | Admitting: Rehabilitative and Restorative Service Providers"

## 2023-03-04 ENCOUNTER — Encounter: Payer: No Typology Code available for payment source | Admitting: Rehabilitative and Restorative Service Providers"

## 2023-03-11 ENCOUNTER — Encounter: Payer: No Typology Code available for payment source | Admitting: Rehabilitative and Restorative Service Providers"

## 2023-03-18 ENCOUNTER — Encounter: Payer: No Typology Code available for payment source | Admitting: Rehabilitative and Restorative Service Providers"

## 2023-09-28 DIAGNOSIS — B977 Papillomavirus as the cause of diseases classified elsewhere: Secondary | ICD-10-CM | POA: Insufficient documentation

## 2023-09-28 DIAGNOSIS — A630 Anogenital (venereal) warts: Secondary | ICD-10-CM | POA: Insufficient documentation

## 2023-09-28 DIAGNOSIS — Z87442 Personal history of urinary calculi: Secondary | ICD-10-CM | POA: Insufficient documentation

## 2023-09-28 DIAGNOSIS — T8859XA Other complications of anesthesia, initial encounter: Secondary | ICD-10-CM | POA: Insufficient documentation

## 2023-09-28 DIAGNOSIS — I10 Essential (primary) hypertension: Secondary | ICD-10-CM | POA: Insufficient documentation

## 2023-09-28 DIAGNOSIS — E785 Hyperlipidemia, unspecified: Secondary | ICD-10-CM | POA: Insufficient documentation

## 2023-09-29 ENCOUNTER — Encounter: Payer: Self-pay | Admitting: Cardiology

## 2023-09-29 ENCOUNTER — Ambulatory Visit: Attending: Cardiology | Admitting: Cardiology

## 2023-09-29 ENCOUNTER — Other Ambulatory Visit: Payer: Self-pay

## 2023-09-29 VITALS — BP 134/84 | HR 100 | Ht 68.0 in | Wt 237.1 lb

## 2023-09-29 DIAGNOSIS — E088 Diabetes mellitus due to underlying condition with unspecified complications: Secondary | ICD-10-CM | POA: Diagnosis not present

## 2023-09-29 DIAGNOSIS — E782 Mixed hyperlipidemia: Secondary | ICD-10-CM

## 2023-09-29 DIAGNOSIS — I10 Essential (primary) hypertension: Secondary | ICD-10-CM

## 2023-09-29 DIAGNOSIS — R011 Cardiac murmur, unspecified: Secondary | ICD-10-CM | POA: Diagnosis not present

## 2023-09-29 DIAGNOSIS — R0609 Other forms of dyspnea: Secondary | ICD-10-CM

## 2023-09-29 DIAGNOSIS — E669 Obesity, unspecified: Secondary | ICD-10-CM

## 2023-09-29 MED ORDER — ROSUVASTATIN CALCIUM 10 MG PO TABS: 10.0000 mg | ORAL_TABLET | Freq: Every day | ORAL | 3 refills | Status: DC

## 2023-09-29 MED ORDER — METOPROLOL TARTRATE 100 MG PO TABS: 100.0000 mg | ORAL_TABLET | Freq: Once | ORAL | 0 refills | Status: DC

## 2023-09-29 NOTE — Progress Notes (Signed)
 Cardiology Office Note:    Date:  09/29/2023   ID:  Patricia Delacruz, DOB Sep 24, 1955, MRN [id number]  PCP:  Ralene Ok, MD  Cardiologist:  Garwin Brothers, MD   Referring MD: Ralene Ok, MD    ASSESSMENT:    1. Hypertension, unspecified type   2. Mixed hyperlipidemia   3. Diabetes mellitus due to underlying condition with unspecified complications (HCC)   4. Cardiac murmur   5. Dyspnea on exertion   6. Obesity (BMI 35.0-39.9 without comorbidity)    PLAN:    In order of problems listed above:  Primary prevention stressed with the patient.  Importance of compliance with diet medication stressed and patient verbalized standing. Dyspnea on exertion: The symptoms are concerning especially in view of the fact that she has multiple risk factors for coronary artery disease.  For this reason I discussed CT coronary angiography and she is agreeable.  Further recommendations will be made based on findings of this test. Essential hypertension: Stable blood pressure and she will continue monitoring.  Lifestyle modification urged. Mixed dyslipidemia: Elevated LDL and diabetic.  Goal LDL less than 70.  I have initiated rosuvastatin 10 mg daily after reviewing recent blood work.  She will back in 6 weeks for liver lipid check.  Diet and exercise stressed. Cardiac murmur: Echocardiogram will be done to assess murmur heard on auscultation. Obesity: Weight reduction stressed and diet emphasized and she promises to do better. Patient will be seen in follow-up appointment in 6 months or earlier if the patient has any concerns.    Medication Adjustments/Labs and Tests Ordered: Current medicines are reviewed at length with the patient today.  Concerns regarding medicines are outlined above.  Orders Placed This Encounter  Procedures   CT CORONARY MORPH W/CTA COR W/SCORE W/CA W/CM &/OR WO/CM   Basic Metabolic Panel (BMET)   Hepatic function panel   Lipid Profile   Basic Metabolic Panel (BMET)   EKG  12-Lead   ECHOCARDIOGRAM COMPLETE   Meds ordered this encounter  Medications   rosuvastatin (CRESTOR) 10 MG tablet    Sig: Take 1 tablet (10 mg total) by mouth daily.    Dispense:  90 tablet    Refill:  3   metoprolol tartrate (LOPRESSOR) 100 MG tablet    Sig: Take 1 tablet (100 mg total) by mouth once for 1 dose. Please take this medication 2 hours before CT    Dispense:  1 tablet    Refill:  0     History of Present Illness:    Patricia Delacruz is a 68 y.o. female who is being seen today for the evaluation of dyspnea on exertion at the request of Ralene Ok, MD. patient is a pleasant 68 year old female.  She has past medical history of essential hypertension, mixed dyslipidemia, diabetes mellitus, obesity and leads a sedentary lifestyle.  She mentions to me that she is having increasing dyspnea on exertion.  For this reason she is here.  No chest pain orthopnea or PND.  Symptoms concerning.  No palpitations dizziness or syncope.  At the time of my evaluation, the patient is alert awake oriented and in no distress.  Past Medical History:  Diagnosis Date   Anxiety    CIN II (cervical intraepithelial neoplasia II)    Complication of anesthesia    pt feels like "I can't breathe after surgery"   Condyloma acuminata    VULVAR   Condyloma acuminatum of vulva 01/28/2012   Depression    High risk HPV  infection    History of kidney stones    Hyperlipidemia    Hypertension    Surgical menopause 09/28/2013   Uterus fibroma 02/02/2012    Past Surgical History:  Procedure Laterality Date   CESAREAN SECTION  1990   CHOLECYSTECTOMY  2011   CO 2 LASER      CERVIX /VAGINA   COLONOSCOPY  NOV/03/2008   NEXT IN 10 YEARS   EXTRACORPOREAL SHOCK WAVE LITHOTRIPSY Right 10/09/2018   Procedure: EXTRACORPOREAL SHOCK WAVE LITHOTRIPSY (ESWL);  Surgeon: Crista Elliot, MD;  Location: [facility];  Service: Urology;  Laterality: Right;   LAPAROSCOPY  03/13/2012   Procedure: LAPAROSCOPY OPERATIVE;   Surgeon: Ok Edwards, MD;  Location: [facility];  Service: Gynecology;  Laterality: N/A;   SALPINGOOPHORECTOMY  03/13/2012   Procedure: SALPINGO OOPHERECTOMY;  Surgeon: Ok Edwards, MD;  Location: [facility];  Service: Gynecology;  Laterality: Bilateral;    Current Medications: Current Meds  Medication Sig   buPROPion (WELLBUTRIN XL) 300 MG 24 hr tablet Take 300 mg by mouth daily.   citalopram (CELEXA) 10 MG tablet Take 10 mg by mouth daily.   DULoxetine (CYMBALTA) 30 MG capsule Take 30 mg by mouth daily.   HYDROcodone-acetaminophen (NORCO/VICODIN) 5-325 MG tablet Take 1 tablet by mouth every 6 (six) hours as needed for moderate pain.   hydroxypropyl methylcellulose / hypromellose (ISOPTO TEARS / GONIOVISC) 2.5 % ophthalmic solution Place 1 drop into both eyes daily as needed for dry eyes.   LORazepam (ATIVAN) 0.5 MG tablet Take 0.5 mg by mouth 2 (two) times daily as needed for anxiety.   metoprolol tartrate (LOPRESSOR) 100 MG tablet Take 1 tablet (100 mg total) by mouth once for 1 dose. Please take this medication 2 hours before CT   ondansetron (ZOFRAN ODT) 8 MG disintegrating tablet Take 1 tablet (8 mg total) by mouth every 8 (eight) hours as needed for nausea or vomiting.   pioglitazone (ACTOS) 30 MG tablet Take 30 mg by mouth daily.   ramipril (ALTACE) 10 MG capsule Take 10 mg by mouth daily.   rosuvastatin (CRESTOR) 10 MG tablet Take 1 tablet (10 mg total) by mouth daily.   Vitamin D, Ergocalciferol, (DRISDOL) 1.25 MG (50000 UNIT) CAPS capsule Take 50,000 Units by mouth once a week.   zolpidem (AMBIEN) 10 MG tablet Take 10 mg by mouth at bedtime.   [DISCONTINUED] ramipril (ALTACE) 5 MG tablet Take 5 mg by mouth daily.   [DISCONTINUED] zolpidem (AMBIEN) 5 MG tablet Take 5 mg by mouth at bedtime as needed for sleep.     Allergies:   Statins and Tetracyclines & related   Social History   Socioeconomic History   Marital status: Single    Spouse name: Not on file   Number of  children: Not on file   Years of education: Not on file   Highest education level: Not on file  Occupational History   Not on file  Tobacco Use   Smoking status: Never   Smokeless tobacco: Never  Vaping Use   Vaping status: Never Used  Substance and Sexual Activity   Alcohol use: Yes    Comment: occ   Drug use: No   Sexual activity: Yes  Other Topics Concern   Not on file  Social History Narrative   Not on file   Social Drivers of Health   Financial Resource Strain: Not on file  Food Insecurity: Not on file  Transportation Needs: Not on file  Physical Activity:  Not on file  Stress: Not on file  Social Connections: Not on file     Family History: The patient's family history includes Cancer in her mother; Diabetes in her father; Hypertension in her father.  ROS:   Please see the history of present illness.    All other systems reviewed and are negative.  EKGs/Labs/Other Studies Reviewed:    The following studies were reviewed today:  EKG Interpretation Date/Time:  Thursday September 29 2023 11:13:58 EDT Ventricular Rate:  100 PR Interval:  130 QRS Duration:  84 QT Interval:  338 QTC Calculation: 436 R Axis:   35  Text Interpretation: Normal sinus rhythm Possible Inferior infarct , age undetermined Cannot rule out Anterior infarct , age undetermined When compared with ECG of 10-Dec-2017 21:00, Minimal criteria for Anterior infarct are now Present Borderline criteria for Inferior infarct are now Present Nonspecific T wave abnormality now evident in Anterolateral leads Confirmed by Belva Crome [phone]) on 09/29/2023 11:16:50 AM     Recent Labs: No results found for requested labs within last 365 days.  Recent Lipid Panel No results found for: "CHOL", "TRIG", "HDL", "CHOLHDL", "VLDL", "LDLCALC", "LDLDIRECT"  Physical Exam:    VS:  BP 134/84   Pulse 100   Ht 5\' 8"  (1.727 m)   Wt 237 lb 1.3 oz (107.5 kg)   LMP 05/18/2010   SpO2 96%   BMI 36.05 kg/m     Wt  Readings from Last 3 Encounters:  09/29/23 237 lb 1.3 oz (107.5 kg)  08/22/18 228 lb (103.4 kg)  12/10/17 230 lb (104.3 kg)     GEN: Patient is in no acute distress HEENT: Normal NECK: No JVD; No carotid bruits LYMPHATICS: No lymphadenopathy CARDIAC: S1 S2 regular, 2/6 systolic murmur at the apex. RESPIRATORY:  Clear to auscultation without rales, wheezing or rhonchi  ABDOMEN: Soft, non-tender, non-distended MUSCULOSKELETAL:  No edema; No deformity  SKIN: Warm and dry NEUROLOGIC:  Alert and oriented x 3 PSYCHIATRIC:  Normal affect    Signed, Garwin Brothers, MD  09/29/2023 11:36 AM     [facility]

## 2023-09-29 NOTE — Patient Instructions (Signed)
 Medication Instructions:  Your physician has recommended you make the following change in your medication:   START: Rosuvastatin 10 mg daily  *If you need a refill on your cardiac medications before your next appointment, please call your pharmacy*  Lab Work: Your physician recommends that you return for lab work in:   Labs in 6 months: BMP, LFT, Lipids Labs today: BMP  If you have labs (blood work) drawn today and your tests are completely normal, you will receive your results only by: MyChart Message (if you have MyChart) OR A paper copy in the mail If you have any lab test that is abnormal or we need to change your treatment, we will call you to review the results.  Testing/Procedures: Your physician has requested that you have an echocardiogram. Echocardiography is a painless test that uses sound waves to create images of your heart. It provides your doctor with information about the size and shape of your heart and how well your heart's chambers and valves are working. This procedure takes approximately one hour. There are no restrictions for this procedure. Please do NOT wear cologne, perfume, aftershave, or lotions (deodorant is allowed). Please arrive 15 minutes prior to your appointment time.  Please note: We ask at that you not bring children with you during ultrasound (echo/ vascular) testing. Due to room size and safety concerns, children are not allowed in the ultrasound rooms during exams. Our front office staff cannot provide observation of children in our lobby area while testing is being conducted. An adult accompanying a patient to their appointment will only be allowed in the ultrasound room at the discretion of the ultrasound technician under special circumstances. We apologize for any inconvenience.    Your cardiac CT will be scheduled at one of the below locations:   [facility] [address] [place], [place] [address] [phone]  OR  [facility] [address] [place], [place] [address] [phone]  OR   [facility] [address] [place], [place] [address] [phone]  OR   [facility] [address] [place], [place] [address] [phone]  OR   [facility] [address]  [place], [place] [address] Opening October 17, 2023  If scheduled at [facility], please arrive at the [facility] and Children's Entrance (Entrance C2) of [facility] 30 minutes prior to test start time. You can use the FREE valet parking offered at entrance C (encouraged to control the heart rate for the test)  Proceed to the [facility] Radiology Department (first floor) to check-in and test prep.  All radiology patients and guests should use entrance C2 at [facility], accessed from [facility], even though the hospital's physical address listed is [address]    If scheduled at [facility] or [facility], please arrive 15 mins early for check-in and test prep.  There is spacious parking and easy access to the radiology department from the [facility] entrance. Please enter here and check-in with the desk attendant.   If scheduled at [facility], please arrive 30 minutes early for check-in and test prep.  Please follow these instructions carefully (unless otherwise directed):  An IV will be required for this test and Nitroglycerin will be given.  Hold all erectile dysfunction medications at least 3 days (72 hrs) prior to test. (Ie  viagra, cialis, sildenafil, tadalafil, etc)   On the Night Before the Test: Be sure to Drink plenty of water. Do not consume any caffeinated/decaffeinated beverages or chocolate 12 hours prior to your test. Do not take any antihistamines 12 hours prior to your  test.  On the Day of the Test: Drink plenty of water until 1 hour prior to the test. Do not eat any food 1 hour prior to test. You may take your regular medications prior to the test.  Take metoprolol (Lopressor) two hours prior to test. Patients who wear a continuous glucose monitor MUST remove the device prior to scanning. FEMALES- please wear underwire-free bra if available, avoid dresses & tight clothing      After the Test: Drink plenty of water. After receiving IV contrast, you may experience a mild flushed feeling. This is normal. On occasion, you may experience a mild rash up to 24 hours after the test. This is not dangerous. If this occurs, you can take Benadryl 25 mg, Zyrtec, Claritin, or Allegra and increase your fluid intake. (Patients taking Tikosyn should avoid Benadryl, and may take Zyrtec, Claritin, or Allegra) If you experience trouble breathing, this can be serious. If it is severe call 911 IMMEDIATELY. If it is mild, please call our office.  We will call to schedule your test 2-4 weeks out understanding that some insurance companies will need an authorization prior to the service being performed.   For more information and frequently asked questions, please visit our website : [email or web address]  For non-scheduling related questions, please contact the cardiac imaging nurse navigator should you have any questions/concerns: Cardiac Imaging Nurse Navigators Direct Office Dial: [phone]   For scheduling needs, including cancellations and rescheduling, please call [place], [phone].   Follow-Up: At [facility], you and your health needs are our priority.  As part of our continuing mission to provide you with exceptional heart care, our providers are all part of one team.  This team includes your primary Cardiologist (physician) and Advanced Practice Providers or APPs (Physician Assistants and Nurse Practitioners) who all work together to  provide you with the care you need, when you need it.  Your next appointment:   1 year(s)  Provider:   Belva Crome, MD    We recommend signing up for the patient portal called "MyChart".  Sign up information is provided on this After Visit Summary.  MyChart is used to connect with patients for Virtual Visits (Telemedicine).  Patients are able to view lab/test results, encounter notes, upcoming appointments, etc.  Non-urgent messages can be sent to your provider as well.   To learn more about what you can do with MyChart, go to [email or web address].   Other Instructions None

## 2023-09-30 LAB — BASIC METABOLIC PANEL WITH GFR
BUN/Creatinine Ratio: 23 (ref 12–28)
BUN: 19 mg/dL (ref 8–27)
CO2: 24 mmol/L (ref 20–29)
Calcium: 9.3 mg/dL (ref 8.7–10.3)
Chloride: 100 mmol/L (ref 96–106)
Creatinine, Ser: 0.84 mg/dL (ref 0.57–1.00)
Glucose: 195 mg/dL — ABNORMAL HIGH (ref 70–99)
Potassium: 5 mmol/L (ref 3.5–5.2)
Sodium: 139 mmol/L (ref 134–144)
eGFR: 77 mL/min/{1.73_m2} (ref >59)

## 2023-10-04 ENCOUNTER — Other Ambulatory Visit: Payer: Self-pay | Admitting: Cardiology

## 2023-10-04 DIAGNOSIS — I10 Essential (primary) hypertension: Secondary | ICD-10-CM

## 2023-10-04 DIAGNOSIS — E782 Mixed hyperlipidemia: Secondary | ICD-10-CM

## 2023-10-04 DIAGNOSIS — R011 Cardiac murmur, unspecified: Secondary | ICD-10-CM

## 2023-10-04 DIAGNOSIS — E088 Diabetes mellitus due to underlying condition with unspecified complications: Secondary | ICD-10-CM

## 2023-10-04 DIAGNOSIS — E669 Obesity, unspecified: Secondary | ICD-10-CM

## 2023-10-04 DIAGNOSIS — R0609 Other forms of dyspnea: Secondary | ICD-10-CM

## 2023-10-11 ENCOUNTER — Telehealth: Payer: Self-pay | Admitting: Cardiology

## 2023-10-11 DIAGNOSIS — E782 Mixed hyperlipidemia: Secondary | ICD-10-CM

## 2023-10-11 NOTE — Telephone Encounter (Signed)
 Spoke with pt who states that the pharmacist advised her not to take the medication due to her allergies. Pt states she spoke with Dr. Nyoka Bellini office who also states she had an allergic reaction. Pt states they said something about her blood pressure when she took it. Pt is unable to answer what reaction she had.

## 2023-10-11 NOTE — Telephone Encounter (Signed)
 Pt c/o medication issue:  1. Name of Medication:   rosuvastatin  (CRESTOR ) 10 MG tablet    2. How are you currently taking this medication (dosage and times per day)?    3. Are you having a reaction (difficulty breathing--STAT)? no  4. What is your medication issue? Pharmacy states that's the patient have allergy to this medication. Please advise

## 2023-10-13 NOTE — Telephone Encounter (Signed)
 Recommendations reviewed with pt as per Dr. Alvester Aw note.  Pt verbalized understanding and had no additional questions. Referral to lipid clinic

## 2023-10-21 ENCOUNTER — Encounter (HOSPITAL_COMMUNITY): Payer: Self-pay

## 2023-10-25 ENCOUNTER — Ambulatory Visit (HOSPITAL_BASED_OUTPATIENT_CLINIC_OR_DEPARTMENT_OTHER): Admission: RE | Admit: 2023-10-25 | Source: Ambulatory Visit

## 2023-10-31 ENCOUNTER — Ambulatory Visit (HOSPITAL_BASED_OUTPATIENT_CLINIC_OR_DEPARTMENT_OTHER)
Admission: RE | Admit: 2023-10-31 | Discharge: 2023-10-31 | Disposition: A | Discharge status: Home / Self Care | Source: Ambulatory Visit | Attending: Cardiology | Admitting: Cardiology

## 2023-10-31 DIAGNOSIS — R0609 Other forms of dyspnea: Secondary | ICD-10-CM | POA: Diagnosis not present

## 2023-10-31 DIAGNOSIS — R011 Cardiac murmur, unspecified: Secondary | ICD-10-CM | POA: Insufficient documentation

## 2023-11-01 ENCOUNTER — Ambulatory Visit: Payer: Self-pay | Admitting: Cardiology

## 2023-11-01 LAB — ECHOCARDIOGRAM COMPLETE
AR max vel: 1.5 cm2
AV Area VTI: 1.56 cm2
AV Area mean vel: 1.45 cm2
AV Mean grad: 10 mmHg
AV Peak grad: 16.4 mmHg
Ao pk vel: 2.02 m/s
Area-P 1/2: 5.23 cm2
Calc EF: 68.3 %
S' Lateral: 1.7 cm
Single Plane A2C EF: 65.7 %
Single Plane A4C EF: 68.1 %

## 2023-11-10 ENCOUNTER — Encounter (HOSPITAL_COMMUNITY): Payer: Self-pay

## 2023-11-15 ENCOUNTER — Ambulatory Visit (HOSPITAL_BASED_OUTPATIENT_CLINIC_OR_DEPARTMENT_OTHER): Admission: RE | Admit: 2023-11-15 | Source: Ambulatory Visit

## 2023-12-09 ENCOUNTER — Ambulatory Visit: Payer: Self-pay | Admitting: Pharmacist

## 2024-02-10 ENCOUNTER — Emergency Department (HOSPITAL_COMMUNITY)

## 2024-02-10 ENCOUNTER — Inpatient Hospital Stay (HOSPITAL_COMMUNITY)
Admission: EM | Admit: 2024-02-10 | Discharge: 2024-02-21 | DRG: 184 | Disposition: A | Discharge status: Skilled Nursing Facility | Attending: Internal Medicine | Admitting: Internal Medicine

## 2024-02-10 ENCOUNTER — Encounter (HOSPITAL_COMMUNITY): Payer: Self-pay

## 2024-02-10 DIAGNOSIS — Z888 Allergy status to other drugs, medicaments and biological substances status: Secondary | ICD-10-CM

## 2024-02-10 DIAGNOSIS — I5030 Unspecified diastolic (congestive) heart failure: Secondary | ICD-10-CM | POA: Diagnosis present

## 2024-02-10 DIAGNOSIS — W19XXXA Unspecified fall, initial encounter: Secondary | ICD-10-CM | POA: Diagnosis present

## 2024-02-10 DIAGNOSIS — Z7984 Long term (current) use of oral hypoglycemic drugs: Secondary | ICD-10-CM

## 2024-02-10 DIAGNOSIS — S2241XA Multiple fractures of ribs, right side, initial encounter for closed fracture: Secondary | ICD-10-CM | POA: Diagnosis present

## 2024-02-10 DIAGNOSIS — S2249XA Multiple fractures of ribs, unspecified side, initial encounter for closed fracture: Secondary | ICD-10-CM | POA: Diagnosis present

## 2024-02-10 DIAGNOSIS — Z79899 Other long term (current) drug therapy: Secondary | ICD-10-CM

## 2024-02-10 DIAGNOSIS — Z8249 Family history of ischemic heart disease and other diseases of the circulatory system: Secondary | ICD-10-CM

## 2024-02-10 DIAGNOSIS — M549 Dorsalgia, unspecified: Secondary | ICD-10-CM | POA: Diagnosis present

## 2024-02-10 DIAGNOSIS — R0781 Pleurodynia: Secondary | ICD-10-CM | POA: Diagnosis not present

## 2024-02-10 DIAGNOSIS — Z751 Person awaiting admission to adequate facility elsewhere: Secondary | ICD-10-CM

## 2024-02-10 DIAGNOSIS — I5189 Other ill-defined heart diseases: Secondary | ICD-10-CM

## 2024-02-10 DIAGNOSIS — I350 Nonrheumatic aortic (valve) stenosis: Secondary | ICD-10-CM | POA: Diagnosis present

## 2024-02-10 DIAGNOSIS — N179 Acute kidney failure, unspecified: Secondary | ICD-10-CM | POA: Diagnosis present

## 2024-02-10 DIAGNOSIS — M25511 Pain in right shoulder: Secondary | ICD-10-CM | POA: Diagnosis not present

## 2024-02-10 DIAGNOSIS — S0990XA Unspecified injury of head, initial encounter: Secondary | ICD-10-CM | POA: Diagnosis present

## 2024-02-10 DIAGNOSIS — S42021A Displaced fracture of shaft of right clavicle, initial encounter for closed fracture: Secondary | ICD-10-CM | POA: Diagnosis present

## 2024-02-10 DIAGNOSIS — E1165 Type 2 diabetes mellitus with hyperglycemia: Secondary | ICD-10-CM | POA: Diagnosis present

## 2024-02-10 DIAGNOSIS — Z8544 Personal history of malignant neoplasm of other female genital organs: Secondary | ICD-10-CM

## 2024-02-10 DIAGNOSIS — Z87410 Personal history of cervical dysplasia: Secondary | ICD-10-CM

## 2024-02-10 DIAGNOSIS — Z87442 Personal history of urinary calculi: Secondary | ICD-10-CM

## 2024-02-10 DIAGNOSIS — Z9049 Acquired absence of other specified parts of digestive tract: Secondary | ICD-10-CM

## 2024-02-10 DIAGNOSIS — Z807 Family history of other malignant neoplasms of lymphoid, hematopoietic and related tissues: Secondary | ICD-10-CM

## 2024-02-10 DIAGNOSIS — E66812 Obesity, class 2: Secondary | ICD-10-CM | POA: Diagnosis present

## 2024-02-10 DIAGNOSIS — Z833 Family history of diabetes mellitus: Secondary | ICD-10-CM

## 2024-02-10 DIAGNOSIS — Z881 Allergy status to other antibiotic agents status: Secondary | ICD-10-CM

## 2024-02-10 DIAGNOSIS — I10 Essential (primary) hypertension: Secondary | ICD-10-CM | POA: Diagnosis present

## 2024-02-10 DIAGNOSIS — Z7401 Bed confinement status: Secondary | ICD-10-CM | POA: Diagnosis not present

## 2024-02-10 DIAGNOSIS — E669 Obesity, unspecified: Secondary | ICD-10-CM | POA: Diagnosis present

## 2024-02-10 DIAGNOSIS — F32A Depression, unspecified: Secondary | ICD-10-CM | POA: Diagnosis present

## 2024-02-10 DIAGNOSIS — K219 Gastro-esophageal reflux disease without esophagitis: Secondary | ICD-10-CM | POA: Diagnosis present

## 2024-02-10 DIAGNOSIS — F419 Anxiety disorder, unspecified: Secondary | ICD-10-CM | POA: Diagnosis present

## 2024-02-10 DIAGNOSIS — I951 Orthostatic hypotension: Secondary | ICD-10-CM | POA: Diagnosis not present

## 2024-02-10 DIAGNOSIS — I959 Hypotension, unspecified: Secondary | ICD-10-CM | POA: Diagnosis not present

## 2024-02-10 DIAGNOSIS — W109XXA Fall (on) (from) unspecified stairs and steps, initial encounter: Secondary | ICD-10-CM | POA: Diagnosis present

## 2024-02-10 DIAGNOSIS — E785 Hyperlipidemia, unspecified: Secondary | ICD-10-CM | POA: Diagnosis present

## 2024-02-10 DIAGNOSIS — I110 Hypertensive heart disease with heart failure: Secondary | ICD-10-CM | POA: Diagnosis present

## 2024-02-10 DIAGNOSIS — Z6836 Body mass index (BMI) 36.0-36.9, adult: Secondary | ICD-10-CM

## 2024-02-10 DIAGNOSIS — S270XXA Traumatic pneumothorax, initial encounter: Secondary | ICD-10-CM | POA: Diagnosis not present

## 2024-02-10 DIAGNOSIS — S42009A Fracture of unspecified part of unspecified clavicle, initial encounter for closed fracture: Secondary | ICD-10-CM | POA: Diagnosis present

## 2024-02-10 DIAGNOSIS — R32 Unspecified urinary incontinence: Secondary | ICD-10-CM | POA: Diagnosis not present

## 2024-02-10 LAB — COMPREHENSIVE METABOLIC PANEL WITH GFR
ALT: 20 U/L (ref 0–44)
AST: 19 U/L (ref 15–41)
Albumin: 3 g/dL — ABNORMAL LOW (ref 3.5–5.0)
Alkaline Phosphatase: 66 U/L (ref 38–126)
Anion gap: 10 (ref 5–15)
BUN: 25 mg/dL — ABNORMAL HIGH (ref 8–23)
CO2: 25 mmol/L (ref 22–32)
Calcium: 8.4 mg/dL — ABNORMAL LOW (ref 8.9–10.3)
Chloride: 100 mmol/L (ref 98–111)
Creatinine, Ser: 0.82 mg/dL (ref 0.44–1.00)
GFR, Estimated: >60 mL/min (ref >60)
Glucose, Bld: 231 mg/dL — ABNORMAL HIGH (ref 70–99)
Potassium: 3.5 mmol/L (ref 3.5–5.1)
Sodium: 135 mmol/L (ref 135–145)
Total Bilirubin: 0.3 mg/dL (ref 0.0–1.2)
Total Protein: 6.7 g/dL (ref 6.5–8.1)

## 2024-02-10 LAB — CBC WITH DIFFERENTIAL/PLATELET
Abs Immature Granulocytes: 0.08 K/uL — ABNORMAL HIGH (ref 0.00–0.07)
Basophils Absolute: 0.1 K/uL (ref 0.0–0.1)
Basophils Relative: 0 %
Eosinophils Absolute: 0 K/uL (ref 0.0–0.5)
Eosinophils Relative: 0 %
HCT: 38.9 % (ref 36.0–46.0)
Hemoglobin: 11.7 g/dL — ABNORMAL LOW (ref 12.0–15.0)
Immature Granulocytes: 1 %
Lymphocytes Relative: 9 %
Lymphs Abs: 1.3 K/uL (ref 0.7–4.0)
MCH: 25.8 pg — ABNORMAL LOW (ref 26.0–34.0)
MCHC: 30.1 g/dL (ref 30.0–36.0)
MCV: 85.7 fL (ref 80.0–100.0)
Monocytes Absolute: 0.9 K/uL (ref 0.1–1.0)
Monocytes Relative: 6 %
Neutro Abs: 12.5 K/uL — ABNORMAL HIGH (ref 1.7–7.7)
Neutrophils Relative %: 84 %
Platelets: 225 K/uL (ref 150–400)
RBC: 4.54 MIL/uL (ref 3.87–5.11)
RDW: 15.9 % — ABNORMAL HIGH (ref 11.5–15.5)
WBC: 14.9 K/uL — ABNORMAL HIGH (ref 4.0–10.5)
nRBC: 0 % (ref 0.0–0.2)

## 2024-02-10 LAB — HEMOGLOBIN A1C
Hgb A1c MFr Bld: 6.7 % — ABNORMAL HIGH (ref 4.8–5.6)
Mean Plasma Glucose: 145.59 mg/dL

## 2024-02-10 LAB — GLUCOSE, CAPILLARY
Glucose-Capillary: 151 mg/dL — ABNORMAL HIGH (ref 70–99)
Glucose-Capillary: 154 mg/dL — ABNORMAL HIGH (ref 70–99)
Glucose-Capillary: 220 mg/dL — ABNORMAL HIGH (ref 70–99)
Glucose-Capillary: 129 mg/dL — ABNORMAL HIGH (ref 70–99)

## 2024-02-10 MED ORDER — MELATONIN 3 MG PO TABS
3.0000 mg | ORAL_TABLET | Freq: Every evening | ORAL | Status: DC | PRN
  Administered 2024-02-13: 3 mg via ORAL
  Filled 2024-02-10: qty 1

## 2024-02-10 MED ORDER — FENTANYL CITRATE PF 50 MCG/ML IJ SOSY
50.0000 ug | PREFILLED_SYRINGE | Freq: Once | INTRAMUSCULAR | Status: AC
  Administered 2024-02-10: 50 ug via INTRAVENOUS
  Filled 2024-02-10: qty 1

## 2024-02-10 MED ORDER — KETOROLAC TROMETHAMINE 15 MG/ML IJ SOLN
15.0000 mg | Freq: Once | INTRAMUSCULAR | Status: AC
  Administered 2024-02-10: 15 mg via INTRAVENOUS
  Filled 2024-02-10: qty 1

## 2024-02-10 MED ORDER — NALOXONE HCL 0.4 MG/ML IJ SOLN: 0.4000 mg | INTRAMUSCULAR | Status: DC | PRN

## 2024-02-10 MED ORDER — INSULIN ASPART 100 UNIT/ML IJ SOLN
0.0000 [IU] | Freq: Three times a day (TID) | INTRAMUSCULAR | Status: DC
  Administered 2024-02-10: 2 [IU] via SUBCUTANEOUS
  Administered 2024-02-10: 3 [IU] via SUBCUTANEOUS
  Administered 2024-02-11: 1 [IU] via SUBCUTANEOUS
  Administered 2024-02-11 – 2024-02-12 (×2): 2 [IU] via SUBCUTANEOUS
  Administered 2024-02-12: 1 [IU] via SUBCUTANEOUS
  Administered 2024-02-12: 2 [IU] via SUBCUTANEOUS
  Administered 2024-02-13: 1 [IU] via SUBCUTANEOUS
  Administered 2024-02-13: 2 [IU] via SUBCUTANEOUS
  Administered 2024-02-13: 3 [IU] via SUBCUTANEOUS
  Administered 2024-02-14: 2 [IU] via SUBCUTANEOUS
  Administered 2024-02-14: 1 [IU] via SUBCUTANEOUS
  Administered 2024-02-14 – 2024-02-15 (×2): 2 [IU] via SUBCUTANEOUS
  Administered 2024-02-15 – 2024-02-16 (×2): 1 [IU] via SUBCUTANEOUS
  Administered 2024-02-16: 3 [IU] via SUBCUTANEOUS
  Administered 2024-02-16: 2 [IU] via SUBCUTANEOUS
  Administered 2024-02-17 – 2024-02-18 (×5): 1 [IU] via SUBCUTANEOUS
  Administered 2024-02-18 – 2024-02-19 (×4): 2 [IU] via SUBCUTANEOUS
  Administered 2024-02-20: 3 [IU] via SUBCUTANEOUS
  Administered 2024-02-20 – 2024-02-21 (×4): 1 [IU] via SUBCUTANEOUS
  Filled 2024-02-10: qty 0.09

## 2024-02-10 MED ORDER — HYPROMELLOSE (GONIOSCOPIC) 2.5 % OP SOLN: 1.0000 [drp] | Freq: Every day | OPHTHALMIC | Status: DC | PRN

## 2024-02-10 MED ORDER — LIDOCAINE 5 % EX PTCH
2.0000 | MEDICATED_PATCH | CUTANEOUS | Status: DC
  Administered 2024-02-10 – 2024-02-21 (×12): 2 via TRANSDERMAL
  Filled 2024-02-10 (×12): qty 2

## 2024-02-10 MED ORDER — MORPHINE SULFATE (PF) 4 MG/ML IV SOLN
4.0000 mg | Freq: Once | INTRAVENOUS | Status: AC
  Administered 2024-02-10: 4 mg via INTRAVENOUS
  Filled 2024-02-10: qty 1

## 2024-02-10 MED ORDER — PIOGLITAZONE HCL 30 MG PO TABS
30.0000 mg | ORAL_TABLET | Freq: Every day | ORAL | Status: DC
  Administered 2024-02-10 – 2024-02-21 (×12): 30 mg via ORAL
  Filled 2024-02-10 (×12): qty 1

## 2024-02-10 MED ORDER — IOHEXOL 300 MG/ML  SOLN
100.0000 mL | Freq: Once | INTRAMUSCULAR | Status: AC | PRN
  Administered 2024-02-10: 100 mL via INTRAVENOUS

## 2024-02-10 MED ORDER — ACETAMINOPHEN 650 MG RE SUPP: 650.0000 mg | Freq: Four times a day (QID) | RECTAL | Status: DC | PRN

## 2024-02-10 MED ORDER — CITALOPRAM HYDROBROMIDE 10 MG PO TABS: 10.0000 mg | ORAL_TABLET | Freq: Every day | ORAL | Status: DC

## 2024-02-10 MED ORDER — BUPROPION HCL ER (XL) 150 MG PO TB24
300.0000 mg | ORAL_TABLET | Freq: Every day | ORAL | Status: DC
  Administered 2024-02-10: 300 mg via ORAL
  Filled 2024-02-10: qty 2

## 2024-02-10 MED ORDER — ONDANSETRON HCL 4 MG/2ML IJ SOLN
4.0000 mg | Freq: Once | INTRAMUSCULAR | Status: AC
  Administered 2024-02-10: 4 mg via INTRAVENOUS
  Filled 2024-02-10: qty 2

## 2024-02-10 MED ORDER — BUPROPION HCL ER (XL) 150 MG PO TB24
450.0000 mg | ORAL_TABLET | Freq: Every day | ORAL | Status: DC
  Administered 2024-02-11 – 2024-02-21 (×11): 450 mg via ORAL
  Filled 2024-02-10 (×11): qty 3

## 2024-02-10 MED ORDER — ONDANSETRON HCL 4 MG/2ML IJ SOLN
4.0000 mg | Freq: Four times a day (QID) | INTRAMUSCULAR | Status: DC | PRN
  Administered 2024-02-10 – 2024-02-13 (×8): 4 mg via INTRAVENOUS
  Filled 2024-02-10 (×8): qty 2

## 2024-02-10 MED ORDER — PROCHLORPERAZINE EDISYLATE 10 MG/2ML IJ SOLN
5.0000 mg | Freq: Four times a day (QID) | INTRAMUSCULAR | Status: AC | PRN
  Administered 2024-02-10 – 2024-02-11 (×2): 5 mg via INTRAVENOUS
  Filled 2024-02-10 (×2): qty 2

## 2024-02-10 MED ORDER — ZOLPIDEM TARTRATE 5 MG PO TABS: 10.0000 mg | ORAL_TABLET | Freq: Every day | ORAL | Status: DC

## 2024-02-10 MED ORDER — ALBUTEROL SULFATE (2.5 MG/3ML) 0.083% IN NEBU: 2.5000 mg | INHALATION_SOLUTION | RESPIRATORY_TRACT | Status: DC | PRN

## 2024-02-10 MED ORDER — METHOCARBAMOL 500 MG PO TABS
500.0000 mg | ORAL_TABLET | Freq: Three times a day (TID) | ORAL | Status: DC
  Administered 2024-02-10 – 2024-02-13 (×9): 500 mg via ORAL
  Filled 2024-02-10 (×9): qty 1

## 2024-02-10 MED ORDER — DULOXETINE HCL 30 MG PO CPEP
30.0000 mg | ORAL_CAPSULE | Freq: Every day | ORAL | Status: DC
  Administered 2024-02-10 – 2024-02-21 (×12): 30 mg via ORAL
  Filled 2024-02-10 (×12): qty 1

## 2024-02-10 MED ORDER — OXYCODONE HCL 5 MG PO TABS
5.0000 mg | ORAL_TABLET | ORAL | Status: DC | PRN
  Administered 2024-02-10 (×3): 10 mg via ORAL
  Administered 2024-02-11: 5 mg via ORAL
  Administered 2024-02-11 – 2024-02-12 (×2): 10 mg via ORAL
  Filled 2024-02-10 (×3): qty 2
  Filled 2024-02-10: qty 1
  Filled 2024-02-10 (×2): qty 2

## 2024-02-10 MED ORDER — HYDROMORPHONE HCL 1 MG/ML IJ SOLN
0.5000 mg | INTRAMUSCULAR | Status: DC | PRN
  Administered 2024-02-11 – 2024-02-12 (×5): 0.5 mg via INTRAVENOUS
  Filled 2024-02-10 (×6): qty 0.5

## 2024-02-10 MED ORDER — PANTOPRAZOLE SODIUM 40 MG PO TBEC
40.0000 mg | DELAYED_RELEASE_TABLET | Freq: Every day | ORAL | Status: DC
  Administered 2024-02-10 – 2024-02-12 (×3): 40 mg via ORAL
  Filled 2024-02-10 (×3): qty 1

## 2024-02-10 MED ORDER — BUPROPION HCL ER (XL) 150 MG PO TB24
150.0000 mg | ORAL_TABLET | Freq: Once | ORAL | Status: AC
  Administered 2024-02-10: 150 mg via ORAL
  Filled 2024-02-10: qty 1

## 2024-02-10 MED ORDER — LORAZEPAM 1 MG PO TABS
1.0000 mg | ORAL_TABLET | Freq: Two times a day (BID) | ORAL | Status: DC | PRN
  Administered 2024-02-15 – 2024-02-20 (×2): 1 mg via ORAL
  Filled 2024-02-10 (×2): qty 1

## 2024-02-10 MED ORDER — RAMIPRIL 5 MG PO CAPS
10.0000 mg | ORAL_CAPSULE | Freq: Every day | ORAL | Status: DC
  Administered 2024-02-10 – 2024-02-11 (×2): 10 mg via ORAL
  Filled 2024-02-10 (×2): qty 2

## 2024-02-10 MED ORDER — ACETAMINOPHEN 500 MG PO TABS
1000.0000 mg | ORAL_TABLET | Freq: Four times a day (QID) | ORAL | Status: DC
  Administered 2024-02-10 – 2024-02-21 (×37): 1000 mg via ORAL
  Filled 2024-02-10 (×39): qty 2

## 2024-02-10 MED ORDER — LORAZEPAM 0.5 MG PO TABS: 0.5000 mg | ORAL_TABLET | Freq: Two times a day (BID) | ORAL | Status: DC | PRN

## 2024-02-10 MED ORDER — ACETAMINOPHEN 325 MG PO TABS: 650.0000 mg | ORAL_TABLET | Freq: Four times a day (QID) | ORAL | Status: DC | PRN

## 2024-02-10 MED ORDER — ZOLPIDEM TARTRATE 5 MG PO TABS
5.0000 mg | ORAL_TABLET | Freq: Every evening | ORAL | Status: DC | PRN
  Administered 2024-02-12 – 2024-02-17 (×6): 5 mg via ORAL
  Filled 2024-02-10 (×7): qty 1

## 2024-02-10 NOTE — Evaluation (Signed)
 Occupational Therapy Evaluation Patient Details Name: Patricia Delacruz MRN: [id number] DOB: 1955-11-15 Today's Date: 02/10/2024   History of Present Illness   Patient is a 68 y/o female admitted 02/09/24 with fall down stairs at home noted R clavicle fx, R first rib, and 3-6 rib fx.  PMH positive for DM, CIN II (cervical intraepithelial neoplasia), h/o cervical/vulvar cancer, HTN, HLD and obesity.     Clinical Impressions Patricia Delacruz was evaluated s/p the above admission list. She is indep at baseline. Upon evaluation the pt was limited by RUE pain and immobility and unsteady gait. Overall she required min A for functional mobility and cues for safety. Due to the deficits listed below the pt also needs up to mod A for UB ADLs and max A for LB ADLs. Reviewed elbow/wrist/hand HEP and shoulder pendulums. Handouts provided. Pt will benefit from continued acute OT services and HHOT.      If plan is discharge home, recommend the following:   A little help with walking and/or transfers;A lot of help with bathing/dressing/bathroom;Assistance with cooking/housework;Assist for transportation;Help with stairs or ramp for entrance     Functional Status Assessment   Patient has had a recent decline in their functional status and demonstrates the ability to make significant improvements in function in a reasonable and predictable amount of time.     Equipment Recommendations   None recommended by OT      Precautions/Restrictions   Precautions Precautions: Fall Required Braces or Orthoses: Sling Restrictions Weight Bearing Restrictions Per Provider Order: Yes RUE Weight Bearing Per Provider Order: Non weight bearing (okay for ROM of elbow wrist and hand, okay for shoulder pendulums to tolerance) Other Position/Activity Restrictions: PA from ortho approved use of rollator for balance if tolerated without pushing weight on R hand     Mobility Bed Mobility               General bed  mobility comments: OOB on arrival    Transfers Overall transfer level: Needs assistance Equipment used: None Transfers: Sit to/from Stand Sit to Stand: Min assist                  Balance Overall balance assessment: Needs assistance   Sitting balance-Leahy Scale: Good     Standing balance support: No upper extremity supported, During functional activity Standing balance-Leahy Scale: Fair                             ADL either performed or assessed with clinical judgement   ADL Overall ADL's : Needs assistance/impaired Eating/Feeding: Set up;Sitting   Grooming: Moderate assistance   Upper Body Bathing: Moderate assistance   Lower Body Bathing: Moderate assistance;Sit to/from stand   Upper Body Dressing : Moderate assistance;Sitting   Lower Body Dressing: Maximal assistance;Sit to/from stand   Toilet Transfer: Minimal assistance   Toileting- Clothing Manipulation and Hygiene: Contact guard assist;Sitting/lateral lean       Functional mobility during ADLs: Minimal assistance General ADL Comments: limited by RUE immobility and pain     Vision Baseline Vision/History: 0 No visual deficits Vision Assessment?: No apparent visual deficits     Perception Perception: Not tested       Praxis Praxis: Not tested       Pertinent Vitals/Pain Pain Assessment Pain Assessment: Faces Faces Pain Scale: Hurts whole lot Pain Location: R shoulder, and side Pain Descriptors / Indicators: Discomfort Pain Intervention(s): Limited activity within patient's tolerance, Monitored during session  Extremity/Trunk Assessment Upper Extremity Assessment Upper Extremity Assessment: RUE deficits/detail RUE Deficits / Details: elbow, wrist and hadn ROm is [facility]. MMT not testes due to injury. Shoulder remained immobilized RUE Sensation: WNL RUE Coordination: decreased fine motor;decreased gross motor   Lower Extremity Assessment Lower Extremity Assessment: Defer  to PT evaluation   Cervical / Trunk Assessment Cervical / Trunk Assessment: Kyphotic   Communication Communication Communication: No apparent difficulties   Cognition Arousal: Alert Behavior During Therapy: WFL for tasks assessed/performed Cognition: No apparent impairments                               Following commands: Intact       Cueing  General Comments   Cueing Techniques: Verbal cues  VSS   Exercises     Shoulder Instructions      Home Living Family/patient expects to be discharged to:: Private residence Living Arrangements: Spouse/significant other Available Help at Discharge: Family;Available PRN/intermittently Type of Home: House Home Access: Stairs to enter [employer] of Steps: 1 in garage   Home Layout: Two level;Bed/bath upstairs Alternate Level Stairs-Number of Steps: flight Alternate Level Stairs-Rails: Left Bathroom Shower/Tub: Walk-in shower         Home Equipment: Shower seat - built in;Rollator (4 wheels)   Additional Comments: had ordered rollator recently due to planned trip to [place] with family and wanted to have it to sit down      Prior Functioning/Environment Prior Level of Function : Independent/Modified Independent               ADLs Comments: independent    OT Problem List: Decreased strength;Decreased range of motion;Impaired balance (sitting and/or standing);Decreased activity tolerance;Decreased safety awareness;Decreased knowledge of precautions;Decreased knowledge of use of DME or AE;Impaired UE functional use;Pain   OT Treatment/Interventions: Self-care/ADL training;Therapeutic exercise;DME and/or AE instruction;Therapeutic activities;Patient/family education;Balance training      OT Goals(Current goals can be found in the care plan section)   Acute Rehab OT Goals Patient Stated Goal: less pain OT Goal Formulation: With patient Time For Goal Achievement: 02/25/24 Potential to  Achieve Goals: Good ADL Goals Pt Will Perform Grooming: with supervision Pt Will Perform Upper Body Dressing: with set-up Pt Will Perform Lower Body Dressing: with min assist Pt Will Transfer to Toilet: with modified independence Pt/caregiver will Perform Home Exercise Program: Increased ROM;Right Upper extremity;With written HEP provided   OT Frequency:  Min 2X/week    Co-evaluation              AM-PAC OT 6 Clicks Daily Activity     Outcome Measure Help from another person eating meals?: None Help from another person taking care of personal grooming?: A Little Help from another person toileting, which includes using toliet, bedpan, or urinal?: A Lot Help from another person bathing (including washing, rinsing, drying)?: A Lot Help from another person to put on and taking off regular upper body clothing?: A Lot Help from another person to put on and taking off regular lower body clothing?: A Lot 6 Click Score: 15   End of Session Nurse Communication: Mobility status  Activity Tolerance: Patient tolerated treatment well Patient left: in bed  OT Visit Diagnosis: Unsteadiness on feet (R26.81);Other abnormalities of gait and mobility (R26.89);Muscle weakness (generalized) (M62.81);Pain;History of falling (Z91.81)                Time: 8687-8668 OT Time Calculation (min): 19 min Charges:  OT General Charges $OT  Visit: 1 Visit OT Evaluation $OT Eval Moderate Complexity: 1 Mod  Lucie Kendall, OTR/L Acute Rehabilitation Services Office [phone] Secure Chat Communication Preferred   Lucie JONETTA Kendall 02/10/2024, 2:56 PM

## 2024-02-10 NOTE — Evaluation (Signed)
 Physical Therapy Evaluation Patient Details Name: Patricia Delacruz MRN: [id number] DOB: 04/21/1956 Today's Date: 02/10/2024  History of Present Illness  Patient is a 68 y/o female admitted 02/09/24 with fall down stairs at home noted R clavicle fx, R first rib, and 3-6 rib fx.  PMH positive for DM, CIN II (cervical intraepithelial neoplasia), h/o cervical/vulvar cancer, HTN, HLD and obesity.  Clinical Impression  Patient presents with decreased mobility due to pain, limited ROM/strength R shoulder, decreased balance, decreased activity tolerance and history of falling.  Reports previously independent in the home and negotiating stairs though states no railing at top step where she fell.  States spouse able to support at d/c.  Today able to ambulate to and from bathroom with min A without device.  States recent purchase of rollator walker with plans for trip to [place] in 2 weeks.  Feel she will benefit from skilled PT in the acute setting and from HHPT initially at d/c.          If plan is discharge home, recommend the following: A little help with walking and/or transfers;A little help with bathing/dressing/bathroom;Help with stairs or ramp for entrance   Can travel by private vehicle        Equipment Recommendations None recommended by PT  Recommendations for Other Services       Functional Status Assessment Patient has had a recent decline in their functional status and demonstrates the ability to make significant improvements in function in a reasonable and predictable amount of time.     Precautions / Restrictions Precautions Precautions: Fall Required Braces or Orthoses: Sling Restrictions Weight Bearing Restrictions Per Provider Order: Yes RUE Weight Bearing Per Provider Order: Non weight bearing Other Position/Activity Restrictions: PA from ortho approved use of rollator for balance if tolerated without pushing weight on R hand      Mobility  Bed Mobility Overal bed  mobility: Needs Assistance Bed Mobility: Supine to Sit     Supine to sit: Min assist, HOB elevated     General bed mobility comments: moved legs to EOB And lifted trunk on her own, assist for scooting due to painful on bottom/back    Transfers Overall transfer level: Needs assistance Equipment used: None Transfers: Sit to/from Stand (using grabbar when in bathroom) Sit to Stand: Min assist           General transfer comment: up to stand no device but steadying help, offerred L HHA though she did not take it    Ambulation/Gait Ambulation/Gait assistance: Min assist Gait Distance (Feet): 12 Feet (x 2) Assistive device: None Gait Pattern/deviations: Step-through pattern, Decreased stride length, Wide base of support       General Gait Details: imbalance evident walking to and from bathroom with min A though pt not using HHA  Stairs            Wheelchair Mobility     Tilt Bed    Modified Rankin (Stroke Patients Only)       Balance Overall balance assessment: Needs assistance   Sitting balance-Leahy Scale: Good     Standing balance support: No upper extremity supported, During functional activity Standing balance-Leahy Scale: Fair Standing balance comment: wiping after toileting with L hand CGA then assisted for pulling up pants; static standing no UE support                             Pertinent Vitals/Pain Pain Assessment Pain Assessment: 0-10 Pain  Score: 8  Pain Location: R shoulder, and side Pain Descriptors / Indicators: Discomfort Pain Intervention(s): Monitored during session    Home Living Family/patient expects to be discharged to:: Private residence Living Arrangements: Spouse/significant other Available Help at Discharge: Family;Available PRN/intermittently Type of Home: House Home Access: Stairs to enter   [employer] of Steps: 1 in garage Alternate Level Stairs-Number of Steps: flight Home Layout: Two  level;Bed/bath upstairs Home Equipment: Shower seat - built in;Rollator (4 wheels) Additional Comments: had ordered rollator recently due to planned trip to [place] with family and wanted to have it to sit down    Prior Function Prior Level of Function : Independent/Modified Independent               ADLs Comments: independent     Extremity/Trunk Assessment   Upper Extremity Assessment Upper Extremity Assessment: Defer to OT evaluation    Lower Extremity Assessment Lower Extremity Assessment: Generalized weakness    Cervical / Trunk Assessment Cervical / Trunk Assessment: Kyphotic  Communication   Communication Communication: No apparent difficulties    Cognition Arousal: Alert Behavior During Therapy: WFL for tasks assessed/performed   PT - Cognitive impairments: No apparent impairments                                 Cueing       General Comments General comments (skin integrity, edema, etc.): brusing noted on bottom just below initiation of gluteal cleft.  pillows placed for comfort in chair; discussed sleeping in recliner if able and may not need to negotiate stairs initially    Exercises Other Exercises Other Exercises: incentive spirometer x 4 up to after education   Assessment/Plan    PT Assessment Patient needs continued PT services  PT Problem List Decreased strength;Decreased activity tolerance;Decreased balance;Decreased mobility;Decreased knowledge of precautions;Decreased safety awareness;Decreased knowledge of use of DME;Pain       PT Treatment Interventions DME instruction;Gait training;Stair training;Functional mobility training;Therapeutic activities;Therapeutic exercise;Balance training;Patient/family education    PT Goals (Current goals can be found in the Care Plan section)  Acute Rehab PT Goals Patient Stated Goal: return home, hopes to go on family trip in 2 weeks to [place]  PT Goal Formulation: With  patient Time For Goal Achievement: 02/24/24 Potential to Achieve Goals: Good    Frequency Min 3X/week     Co-evaluation               AM-PAC PT 6 Clicks Mobility  Outcome Measure Help needed turning from your back to your side while in a flat bed without using bedrails?: A Lot Help needed moving from lying on your back to sitting on the side of a flat bed without using bedrails?: A Lot Help needed moving to and from a bed to a chair (including a wheelchair)?: A Little Help needed standing up from a chair using your arms (e.g., wheelchair or bedside chair)?: A Little Help needed to walk in hospital room?: A Little Help needed climbing 3-5 steps with a railing? : A Little 6 Click Score: 16    End of Session   Activity Tolerance: Patient limited by pain Patient left: in chair;with call bell/phone within reach;with chair alarm set   PT Visit Diagnosis: Other abnormalities of gait and mobility (R26.89);History of falling (Z91.81);Pain Pain - Right/Left: Right Pain - part of body: Shoulder    Time: 8949-8867 PT Time Calculation (min) (ACUTE ONLY): 42 min   Charges:  PT Evaluation $PT Eval Moderate Complexity: 1 Mod PT Treatments $Therapeutic Activity: 8-22 mins PT General Charges $$ ACUTE PT VISIT: 1 Visit         Micheline Portal, PT Acute Rehabilitation Services Office:[phone] 02/10/2024   Montie Portal 02/10/2024, 12:15 PM

## 2024-02-10 NOTE — ED Triage Notes (Signed)
 Pt comes via [facility] for fall down a flight of steps, mechanical, c/o of pain in R shoulder and collarbone PTA received 650 mg of tylenol 

## 2024-02-10 NOTE — H&P (Signed)
 History and Physical    Patient: Patricia Delacruz FMW:[id number] DOB: [birth year] DOA: 02/10/2024 DOS: the patient was seen and examined on 02/10/2024 PCP: Valma Carwin, MD  Patient coming from: Home  Chief Complaint:  Chief Complaint  Patient presents with   Fall   HPI: Patricia Delacruz is a 68 y.o. female with medical history significant of anxiety, depression, CIN-2, condyloma acuminata, depression, hyperlipidemia, hypertension, fibroid, type 2 diabetes, class II obesity who had a mechanical fall at home going down the stairs developing pain in her right posterior scalp, right shoulder and right chest wall.  Denied any prodromal symptoms. He denied fever, chills, rhinorrhea, sore throat, wheezing or hemoptysis.  No chest pain, palpitations, diaphoresis, PND, orthopnea or pitting edema of the lower extremities.  No abdominal pain, nausea, emesis, diarrhea, constipation, melena or hematochezia.  No flank pain, dysuria, frequency or hematuria.  No polyuria, polydipsia, polyphagia or blurred vision.   Lab work: CBC 0 white count of 14.9, hemoglobin 11.7 grams per deciliter and platelets 225.  CMP showed a glucose of albumin 3.0 g/dL, 768 and BUN of 25 mg/dL.  The rest of the CMP measurements were normal after calcium  was corrected.  Imaging: Right shoulder x-ray negative.  2 view chest radiograph with right 2nd through 5th rib fractures.  No effusion or pneumothorax.  There was vascular congestion.  CT head without contrast with no acute intercranial normality.  There was right posterior scalp hematoma.  No underlying calvarial [name].  CT cervical spine with no vertebral acute fracture.  Acute mildly displaced fracture of the posterior right first rib.  Partially visualized mildly displaced fracture of the right mid clavicle.  CT chest/abdomen/pelvis with contrast show acute fracture of the mid right clavicle.  There were acute fractures posterior right first and 3rd through 6th ribs.  Suspect nondisplaced  fracture of the posterior right second rib.  No pneumothorax or pleural effusion.  No evidence for acute traumatic injury in the abdomen or pelvis.  Small hiatal hernia.  Aortic atherosclerosis.  CT thoracic spine showing osteopenia and degenerative change.  Rib fractures as above.  There was cardiomegaly with CAD.  Chronic mild anterior wedging of the L1 vertebral body.  Multilevel DDD and joint changes with spinal stenosis L3-4, L4-5 and L5-S1.  Foraminal stenosis is greatest at L5-S1.   ED course: Initial vital signs were temperature 97.7 F, pulse 73, respiration 16, blood pressure 145/96 mmHg O2 sat 96% on room air.  Patient was given supplemental oxygen, fentanyl  50 mcg IVP x 1, morphine  4 mg IVP x 1 and ondansetron  4 mg IVP x 1.  I added ketorolac  15 mg IVP x 1.  Review of Systems: As mentioned in the history of present illness. All other systems reviewed and are negative. Past Medical History:  Diagnosis Date   Anxiety    CIN II (cervical intraepithelial neoplasia II)    Complication of anesthesia    pt feels like I can't breathe after surgery   Condyloma acuminata    VULVAR   Condyloma acuminatum of vulva 01/28/2012   Depression    High risk HPV infection    History of kidney stones    Hyperlipidemia    Hypertension    Surgical menopause 09/28/2013   Uterus fibroma 02/02/2012   Past Surgical History:  Procedure Laterality Date   CESAREAN SECTION  1990   CHOLECYSTECTOMY  2011   CO 2 LASER      CERVIX /VAGINA   COLONOSCOPY  NOV/03/2008   NEXT IN  10 YEARS   EXTRACORPOREAL SHOCK WAVE LITHOTRIPSY Right 10/09/2018   Procedure: EXTRACORPOREAL SHOCK WAVE LITHOTRIPSY (ESWL);  Surgeon: Carolee Sherwood JONETTA DOUGLAS, MD;  Location: [facility];  Service: Urology;  Laterality: Right;   LAPAROSCOPY  03/13/2012   Procedure: LAPAROSCOPY OPERATIVE;  Surgeon: Curlee VEAR Guan, MD;  Location: [facility];  Service: Gynecology;  Laterality: N/A;   SALPINGOOPHORECTOMY  03/13/2012   Procedure: SALPINGO  OOPHERECTOMY;  Surgeon: Curlee VEAR Guan, MD;  Location: [facility];  Service: Gynecology;  Laterality: Bilateral;   Social History:  reports that she has never smoked. She has never used smokeless tobacco. She reports current alcohol use. She reports that she does not use drugs.  Allergies  Allergen Reactions   Statins    Tetracyclines & Related Photosensitivity    Family History  Problem Relation Age of Onset   Cancer Mother        LYMPHOMA   Diabetes Father    Hypertension Father     Prior to Admission medications   Medication Sig Start Date End Date Taking? Authorizing Provider  buPROPion  (WELLBUTRIN  XL) 300 MG 24 hr tablet Take 300 mg by mouth daily.    [provider]  citalopram  (CELEXA ) 10 MG tablet Take 10 mg by mouth daily.    [provider]  DULoxetine  (CYMBALTA ) 30 MG capsule Take 30 mg by mouth daily.    [provider]  HYDROcodone -acetaminophen  (NORCO/VICODIN) 5-325 MG tablet Take 1 tablet by mouth every 6 (six) hours as needed for moderate pain.    [provider]  hydroxypropyl methylcellulose / hypromellose (ISOPTO TEARS / GONIOVISC) 2.5 % ophthalmic solution Place 1 drop into both eyes daily as needed for dry eyes.    [provider]  LORazepam  (ATIVAN ) 0.5 MG tablet Take 0.5 mg by mouth 2 (two) times daily as needed for anxiety. 08/26/23   [provider]  metoprolol  tartrate (LOPRESSOR ) 100 MG tablet Take 1 tablet (100 mg total) by mouth once for 1 dose. Please take this medication 2 hours before CT 09/29/23 09/29/23  Revankar, Jennifer SAUNDERS, MD  ondansetron  (ZOFRAN  ODT) 8 MG disintegrating tablet Take 1 tablet (8 mg total) by mouth every 8 (eight) hours as needed for nausea or vomiting. 12/11/17   Molpus, Norleen, MD  pioglitazone  (ACTOS ) 30 MG tablet Take 30 mg by mouth daily.    [provider]  ramipril  (ALTACE ) 10 MG capsule Take 10 mg by mouth daily. 09/22/23   [provider]  rosuvastatin  (CRESTOR ) 10 MG  tablet Take 1 tablet (10 mg total) by mouth daily. 09/29/23   Revankar, Rajan R, MD  Vitamin D, Ergocalciferol, (DRISDOL) 1.25 MG (50000 UNIT) CAPS capsule Take 50,000 Units by mouth once a week. 08/13/23   [provider]  zolpidem  (AMBIEN ) 10 MG tablet Take 10 mg by mouth at bedtime. 09/20/23   [provider]    Physical Exam: Vitals:   02/10/24 0241 02/10/24 0244 02/10/24 0600  BP: (!) 145/96  (!) 146/76  Pulse: 73  78  Resp: 16  18  Temp:  97.7 F (36.5 C) 98.2 F (36.8 C)  SpO2: 96%  98%   Physical Exam Vitals reviewed.  Constitutional:      General: She is awake. She is not in acute distress.    Appearance: She is obese. She is ill-appearing.     Interventions: Nasal cannula in place.  HENT:     Head: Normocephalic.     Nose: No rhinorrhea.  Mouth/Throat:     Mouth: Mucous membranes are moist.  Eyes:     General: No scleral icterus.    Pupils: Pupils are equal, round, and reactive to light.  Neck:     Vascular: No JVD.  Cardiovascular:     Rate and Rhythm: Normal rate and regular rhythm.     Heart sounds: S1 normal and S2 normal.  Pulmonary:     Effort: Pulmonary effort is normal.     Breath sounds: Normal breath sounds. No wheezing, rhonchi or rales.  Abdominal:     General: Bowel sounds are normal. There is no distension.     Palpations: Abdomen is soft.     Tenderness: There is no abdominal tenderness. There is no right CVA tenderness or left CVA tenderness.  Musculoskeletal:     Cervical back: Neck supple.     Right lower leg: No edema.     Left lower leg: No edema.  Skin:    General: Skin is warm and dry.     Findings: Bruising present.  Neurological:     General: No focal deficit present.     Mental Status: She is alert and oriented to person, place, and time.  Psychiatric:        Mood and Affect: Mood normal.        Behavior: Behavior normal. Behavior is cooperative.     Data Reviewed:  Results are pending, will review when  available. 10/31/2023 echocardiogram report. IMPRESSIONS:   1. Left ventricular ejection fraction, by estimation, is 60 to 65%. The left ventricle has normal function. The left ventricle has no regional wall motion abnormalities. Left ventricular diastolic parameters are consistent with Grade I diastolic dysfunction (impaired relaxation).  2. Right ventricular systolic function is normal. The right ventricular size is normal.  3. The mitral valve is normal in structure. No evidence of mitral valve regurgitation. No evidence of mitral stenosis.  4. The aortic valve is calcified. There is mild calcification of the aortic valve. There is mild thickening of the aortic valve. Aortic valve regurgitation is not visualized. Mild aortic valve stenosis. Aortic valve mean gradient measures 10.0 mmHg.  5. The inferior vena cava is normal in size with greater than 50% respiratory variability, suggesting right atrial pressure of 3 mmHg.  Assessment and Plan: Principal Problem:   Fall Resulting in:   Fracture of multiple ribs   Clavicle fracture Observation/telemetry. Analgesics as needed. Antiemetics as needed. Pantoprazole  40 mg IVP daily. Follow CBC, CMP and lipase in AM.  Active Problems:   Depression   Anxiety Continue Wellbutrin  450 mg p.o. daily. Continue duloxetine  30 mg p.o. daily. Continue lorazepam  1 mg p.o. twice daily as needed. Continue zolpidem  at bedtime. - Dose will be reduced to 5 mg nightly.    Hyperlipidemia Intolerant to statins. Follow-up with primary care provider.    Hypertension Continue ramipril  10 mg p.o. daily.    Grade I diastolic dysfunction  Did not tolerate metoprolol . Continue ramipril  10 mg p.o. daily.    Obesity (BMI 35.0-39.9 without comorbidity) Current BMI and weight pending. Status she has lost over 10 pounds since April. Continue lifestyle modifications. Follow-up closely with PCP.    Type 2 diabetes mellitus with hyperglycemia  (HCC) Carbohydrate modified diet. Continue pioglitazone  30 mg p.o. daily. CBG monitoring with RI SS. Check hemoglobin A1c.      Advance Care Planning:   Code Status: Full Code   Consults: Trauma team/[facility] [place] [place] surgery. Orthopedic surgery.  Family Communication:   Severity  of Illness: The appropriate patient status for this patient is OBSERVATION. Observation status is judged to be reasonable and necessary in order to provide the required intensity of service to ensure the patient's safety. The patient's presenting symptoms, physical exam findings, and initial radiographic and laboratory data in the context of their medical condition is felt to place them at decreased risk for further clinical deterioration. Furthermore, it is anticipated that the patient will be medically stable for discharge from the hospital within 2 midnights of admission.   Author: Alm Dorn Castor, MD 02/10/2024 8:04 AM  For on call review [email or web address].   This document was prepared using Dragon voice recognition software and may contain some unintended transcription errors.

## 2024-02-10 NOTE — Progress Notes (Signed)
 IV out due to drainage. Attempted 2x unsuccessful. IV team consulted.

## 2024-02-10 NOTE — ED Provider Notes (Signed)
 [place] EMERGENCY DEPARTMENT AT [facility] Provider Note   CSN: [id number] Arrival date & time: 02/10/24  [id number]     Patient presents with: Patricia Delacruz is a 68 y.o. female.   The history is provided by the patient, the EMS personnel and medical records.  Fall   Patricia Delacruz is a 68 y.o. female who presents to the Emergency Department complaining of fall. She presents the emergency department by EMS for evaluation of injuries following a fall down a flight of stairs. She states that she lost her balance and fell, striking her right shoulder. No loss of consciousness. No recent illnesses. She has a history of hypertension, diabetes.    Prior to Admission medications   Medication Sig Start Date End Date Taking? Authorizing Provider  buPROPion  (WELLBUTRIN  XL) 300 MG 24 hr tablet Take 300 mg by mouth daily.    [provider]  citalopram  (CELEXA ) 10 MG tablet Take 10 mg by mouth daily.    [provider]  DULoxetine  (CYMBALTA ) 30 MG capsule Take 30 mg by mouth daily.    [provider]  HYDROcodone -acetaminophen  (NORCO/VICODIN) 5-325 MG tablet Take 1 tablet by mouth every 6 (six) hours as needed for moderate pain.    [provider]  hydroxypropyl methylcellulose / hypromellose (ISOPTO TEARS / GONIOVISC) 2.5 % ophthalmic solution Place 1 drop into both eyes daily as needed for dry eyes.    [provider]  LORazepam  (ATIVAN ) 0.5 MG tablet Take 0.5 mg by mouth 2 (two) times daily as needed for anxiety. 08/26/23   [provider]  metoprolol  tartrate (LOPRESSOR ) 100 MG tablet Take 1 tablet (100 mg total) by mouth once for 1 dose. Please take this medication 2 hours before CT 09/29/23 09/29/23  Revankar, Jennifer SAUNDERS, MD  ondansetron  (ZOFRAN  ODT) 8 MG disintegrating tablet Take 1 tablet (8 mg total) by mouth every 8 (eight) hours as needed for nausea or vomiting. 12/11/17   Molpus, Norleen, MD  pioglitazone  (ACTOS ) 30 MG tablet Take  30 mg by mouth daily.    [provider]  ramipril  (ALTACE ) 10 MG capsule Take 10 mg by mouth daily. 09/22/23   [provider]  rosuvastatin  (CRESTOR ) 10 MG tablet Take 1 tablet (10 mg total) by mouth daily. 09/29/23   Revankar, Rajan R, MD  Vitamin D, Ergocalciferol, (DRISDOL) 1.25 MG (50000 UNIT) CAPS capsule Take 50,000 Units by mouth once a week. 08/13/23   [provider]  zolpidem  (AMBIEN ) 10 MG tablet Take 10 mg by mouth at bedtime. 09/20/23   [provider]    Allergies: Statins and Tetracyclines & related    Review of Systems  All other systems reviewed and are negative.   Updated Vital Signs BP (!) 146/76   Pulse 78   Temp 98.2 F (36.8 C)   Resp 18   LMP 05/18/2010   SpO2 98%   Physical Exam Vitals and nursing note reviewed.  Constitutional:      Appearance: She is well-developed.  HENT:     Head: Normocephalic and atraumatic.  Cardiovascular:     Rate and Rhythm: Normal rate and regular rhythm.     Heart sounds: No murmur heard. Pulmonary:     Effort: Pulmonary effort is normal. No respiratory distress.     Breath sounds: Normal breath sounds.  Abdominal:     Palpations: Abdomen is soft.     Tenderness: There is no abdominal tenderness. There is no guarding or rebound.  Musculoskeletal:     Cervical back: No tenderness.     Comments: 2+ radial pulses bilaterally. There is tenderness palpation over the right shoulder and clavicle. No tenderness over the elbow, wrist.  Skin:    General: Skin is warm and dry.  Neurological:     Mental Status: She is alert and oriented to person, place, and time.  Psychiatric:        Behavior: Behavior normal.     (all labs ordered are listed, but only abnormal results are displayed) Labs Reviewed  COMPREHENSIVE METABOLIC PANEL WITH GFR - Abnormal; Notable for the following components:      Result Value   Glucose, Bld 231 (*)    BUN 25 (*)    Calcium  8.4 (*)    Albumin 3.0 (*)    All  other components within normal limits  CBC WITH DIFFERENTIAL/PLATELET - Abnormal; Notable for the following components:   WBC 14.9 (*)    Hemoglobin 11.7 (*)    MCH 25.8 (*)    RDW 15.9 (*)    Neutro Abs 12.5 (*)    Abs Immature Granulocytes 0.08 (*)    All other components within normal limits  URINALYSIS, ROUTINE W REFLEX MICROSCOPIC    EKG: None  Radiology: CT T-SPINE NO CHARGE Result Date: 02/10/2024 CLINICAL DATA:  Clemens down a flight of stairs. Multiple known rib fractures. Back pain. EXAM: CT THORACIC AND LUMBAR SPINE WITHOUT CONTRAST TECHNIQUE: Multidetector CT imaging of the thoracic and lumbar spine was performed without intravenous contrast. Multiplanar CT image reconstructions were also generated. RADIATION DOSE REDUCTION: This exam was performed according to the departmental dose-optimization program which includes automated exposure control, adjustment of the mA and/or kV according to patient size and/or use of iterative reconstruction technique. COMPARISON:  PA Lat chest from today and 02/07/2012. CT abdomen pelvis and reconstructions 08/22/2018. FINDINGS: CT THORACIC SPINE FINDINGS Segmentation: There are 12 rib-bearing thoracic type vertebrae. Alignment: There is mild dextroscoliosis but no AP listhesis. Vertebrae: Osteopenia. No thoracic spine fractures or focal pathologic process identified. There is thoracic spondylosis and multilevel endplate Schmorl's nodes beginning at T5, but also seen at T2-3 T3-4. Paraspinal and other soft tissues: No paraspinal mass, hematoma or fluid collections. Other soft tissue structures reported separately. Moderate hiatal hernia. Cardiomegaly with CAD. No pneumothorax. Disc levels: The thoracic discs are diffusely degenerated. The spinal canal is not optimally seen due to beam hardening related to body habitus. There is no obvious significant soft tissue or bony encroachment on the thecal sac such as due to herniated discs or spinal canal hematoma.  The spinal canal appears widely patent as well as can be seen. There are slight facet spurs at lower thoracic levels but no levels demonstrate significant foraminal stenosis. Other: Midshaft right clavicle acute fracture with overriding and mild stranding. Nondisplaced fracture posteromedial right first rib, anterior right second rib, with displaced fractures of the posterior right third through fifth ribs, posterolateral right sixth rib. CT LUMBAR SPINE FINDINGS Segmentation: Standard. Alignment: Trace chronic discogenic L2-3 retrolisthesis. Slight dextroscoliosis. Otherwise normal alignment. Vertebrae: Osteopenia without evidence of fractures. There is mild chronic anterior wedging of the L1 vertebral body, but no more than previously. There is moderate spondylosis, greatest at L1-2, L2-3, L5-S1. Paraspinal and other soft tissues: Reported separately. No acute findings. Disc levels: The discs are chronically collapsed, with vacuum phenomenon and endplate irregularities, at all lumbar levels except for a T12-L1 and L4-5, both of which have relatively normal disc heights. At L3-4, a circumferential  disc osteophyte complex and dorsal ligamentous hypertrophy cause moderate spinal canal stenosis. There is mild spinal stenosis at L4-5 due to a disc bulge and slightly thickened ligaments. Moderate spinal stenosis is seen again at L5-S1 due to bidirectional osteophytes and a calcified central disc extrusion. There is relatively mild facet joint hypertrophy. Along with the spondylosis this is associated with foraminal stenosis which is mild-to-moderate on the left at L2-3, bilaterally mild at L3-4, mild-to-moderate on the left at L4-5, and bilaterally moderate to severe at L5-S1. Both SI joints are patent with spurring and vacuum phenomenon. IMPRESSION: 1. Osteopenia and degenerative change without evidence of acute fractures of the thoracic or lumbar spine. 2. Multiple right rib fractures and right clavicle fracture with  override. 3. Cardiomegaly with CAD. 4. Moderate hiatal hernia. 5. Chronic mild anterior wedging of the L1 vertebral body. 6. Multilevel degenerative disc and joint changes with spinal stenosis at L3-4, L4-5 and L5-S1. 7. Foraminal stenosis greatest at L5-S1. Electronically Signed   By: Francis Quam M.D.   On: 02/10/2024 06:20   CT L-SPINE NO CHARGE Result Date: 02/10/2024 CLINICAL DATA:  Clemens down a flight of stairs. Multiple known rib fractures. Back pain. EXAM: CT THORACIC AND LUMBAR SPINE WITHOUT CONTRAST TECHNIQUE: Multidetector CT imaging of the thoracic and lumbar spine was performed without intravenous contrast. Multiplanar CT image reconstructions were also generated. RADIATION DOSE REDUCTION: This exam was performed according to the departmental dose-optimization program which includes automated exposure control, adjustment of the mA and/or kV according to patient size and/or use of iterative reconstruction technique. COMPARISON:  PA Lat chest from today and 02/07/2012. CT abdomen pelvis and reconstructions 08/22/2018. FINDINGS: CT THORACIC SPINE FINDINGS Segmentation: There are 12 rib-bearing thoracic type vertebrae. Alignment: There is mild dextroscoliosis but no AP listhesis. Vertebrae: Osteopenia. No thoracic spine fractures or focal pathologic process identified. There is thoracic spondylosis and multilevel endplate Schmorl's nodes beginning at T5, but also seen at T2-3 T3-4. Paraspinal and other soft tissues: No paraspinal mass, hematoma or fluid collections. Other soft tissue structures reported separately. Moderate hiatal hernia. Cardiomegaly with CAD. No pneumothorax. Disc levels: The thoracic discs are diffusely degenerated. The spinal canal is not optimally seen due to beam hardening related to body habitus. There is no obvious significant soft tissue or bony encroachment on the thecal sac such as due to herniated discs or spinal canal hematoma. The spinal canal appears widely patent as well  as can be seen. There are slight facet spurs at lower thoracic levels but no levels demonstrate significant foraminal stenosis. Other: Midshaft right clavicle acute fracture with overriding and mild stranding. Nondisplaced fracture posteromedial right first rib, anterior right second rib, with displaced fractures of the posterior right third through fifth ribs, posterolateral right sixth rib. CT LUMBAR SPINE FINDINGS Segmentation: Standard. Alignment: Trace chronic discogenic L2-3 retrolisthesis. Slight dextroscoliosis. Otherwise normal alignment. Vertebrae: Osteopenia without evidence of fractures. There is mild chronic anterior wedging of the L1 vertebral body, but no more than previously. There is moderate spondylosis, greatest at L1-2, L2-3, L5-S1. Paraspinal and other soft tissues: Reported separately. No acute findings. Disc levels: The discs are chronically collapsed, with vacuum phenomenon and endplate irregularities, at all lumbar levels except for a T12-L1 and L4-5, both of which have relatively normal disc heights. At L3-4, a circumferential disc osteophyte complex and dorsal ligamentous hypertrophy cause moderate spinal canal stenosis. There is mild spinal stenosis at L4-5 due to a disc bulge and slightly thickened ligaments. Moderate spinal stenosis is seen again at  L5-S1 due to bidirectional osteophytes and a calcified central disc extrusion. There is relatively mild facet joint hypertrophy. Along with the spondylosis this is associated with foraminal stenosis which is mild-to-moderate on the left at L2-3, bilaterally mild at L3-4, mild-to-moderate on the left at L4-5, and bilaterally moderate to severe at L5-S1. Both SI joints are patent with spurring and vacuum phenomenon. IMPRESSION: 1. Osteopenia and degenerative change without evidence of acute fractures of the thoracic or lumbar spine. 2. Multiple right rib fractures and right clavicle fracture with override. 3. Cardiomegaly with CAD. 4. Moderate  hiatal hernia. 5. Chronic mild anterior wedging of the L1 vertebral body. 6. Multilevel degenerative disc and joint changes with spinal stenosis at L3-4, L4-5 and L5-S1. 7. Foraminal stenosis greatest at L5-S1. Electronically Signed   By: Francis Quam M.D.   On: 02/10/2024 06:20   CT CHEST ABDOMEN PELVIS W CONTRAST Result Date: 02/10/2024 CLINICAL DATA:  Patient fell down a flight of stairs. Pain in multiple areas. Right rib fractures on chest x-ray. EXAM: CT CHEST, ABDOMEN, AND PELVIS WITH CONTRAST TECHNIQUE: Multidetector CT imaging of the chest, abdomen and pelvis was performed following the standard protocol during bolus administration of intravenous contrast. RADIATION DOSE REDUCTION: This exam was performed according to the departmental dose-optimization program which includes automated exposure control, adjustment of the mA and/or kV according to patient size and/or use of iterative reconstruction technique. CONTRAST:  OMNIPAQUE  IOHEXOL  300 MG/ML  SOLN COMPARISON:  CT stone study 03/29/2018 FINDINGS: CT CHEST FINDINGS Cardiovascular: Heart size upper normal. No substantial pericardial effusion. Mild atherosclerotic calcification is noted in the wall of the thoracic aorta. Mediastinum/Nodes: No mediastinal edema or hemorrhage. No mediastinal lymphadenopathy. There is no hilar lymphadenopathy. Small hiatal hernia. The esophagus has normal imaging features. There is no axillary lymphadenopathy. Lungs/Pleura: No evidence for pneumothorax. No substantial pleural effusion. Dependent atelectasis noted in both lower lungs. No focal airspace consolidation. No suspicious pulmonary nodule or mass. Musculoskeletal: Acute fracture noted mid right clavicle. Acute fractures identified in the posterior right first rib. Suspect nondisplaced fracture posterior right second rib. Acute fractures identified posterior right third through sixth ribs. No evidence for left-sided rib fracture. No sternal fracture. No  obvious thoracic spine fracture but see thoracic spine CT report for definitive thoracic spine characterization, dictated separately. CT ABDOMEN PELVIS FINDINGS Hepatobiliary: No suspicious focal abnormality within the liver parenchyma. Gallbladder is surgically absent. No intrahepatic or extrahepatic biliary dilation. Pancreas: No focal mass lesion. No dilatation of the main duct. No intraparenchymal cyst. No peripancreatic edema. Spleen: No splenomegaly. No suspicious focal mass lesion. Adrenals/Urinary Tract: No adrenal nodule or mass. Central sinus cysts are noted in the kidneys bilaterally. No followup imaging is recommended. Kidneys otherwise unremarkable. No evidence for hydroureter. The urinary bladder appears normal for the degree of distention. Stomach/Bowel: Small hiatal hernia. Stomach otherwise unremarkable. Duodenum is normally positioned as is the ligament of Treitz. No small bowel wall thickening. No small bowel dilatation. The terminal ileum is normal. The appendix is normal. No gross colonic mass. No colonic wall thickening. Vascular/Lymphatic: There is mild atherosclerotic calcification of the abdominal aorta without aneurysm. There is no gastrohepatic or hepatoduodenal ligament lymphadenopathy. No retroperitoneal or mesenteric lymphadenopathy. No pelvic sidewall lymphadenopathy. Reproductive: The uterus is unremarkable.  There is no adnexal mass. Other: No intraperitoneal free fluid. Musculoskeletal: No evidence for lumbar spine fracture, but please see report for dedicated lumbar spine CT dictated separately. No evidence for an acute fracture in the bony anatomy of the pelvis. Small  umbilical hernia contains only fat. IMPRESSION: 1. Acute fracture mid right clavicle. 2. Acute fractures posterior right first and third through sixth ribs. Suspect nondisplaced fracture posterior right second rib. No evidence for pneumothorax or pleural effusion. 3. No evidence for acute traumatic injury in the  abdomen or pelvis. No intraperitoneal free fluid. 4. Small hiatal hernia. 5.  Aortic Atherosclerosis (ICD10-I70.0). Electronically Signed   By: Camellia Candle M.D.   On: 02/10/2024 06:00   CT Head Wo Contrast Result Date: 02/10/2024 CLINICAL DATA:  Head trauma, fell down a flight of steps. EXAM: CT HEAD WITHOUT CONTRAST CT CERVICAL SPINE WITHOUT CONTRAST TECHNIQUE: Multidetector CT imaging of the head and cervical spine was performed following the standard protocol without intravenous contrast. Multiplanar CT image reconstructions of the cervical spine were also generated. RADIATION DOSE REDUCTION: This exam was performed according to the departmental dose-optimization program which includes automated exposure control, adjustment of the mA and/or kV according to patient size and/or use of iterative reconstruction technique. COMPARISON:  None Available. FINDINGS: CT HEAD FINDINGS Brain: No intracranial hemorrhage, mass effect, or evidence of acute infarct. No hydrocephalus. No extra-axial fluid collection. Vascular: No hyperdense vessel or unexpected calcification. Skull: No fracture or focal lesion.  Right posterior scalp hematoma. Sinuses/Orbits: No acute finding. Paranasal sinuses and mastoid air cells are well aerated. Other: None. CT CERVICAL SPINE FINDINGS Alignment: No evidence of traumatic malalignment. Skull base and vertebrae: No acute fracture. No primary bone lesion or focal pathologic process. Soft tissues and spinal canal: No prevertebral fluid or swelling. No visible canal hematoma. Disc levels: Multilevel spondylosis with osteophytic spurring, disc space height loss, and degenerative endplate changes greatest at C4 through C7 where it is moderate to advanced. Multilevel posterior disc osteophyte complexes cause mild effacement of the ventral thecal sac. No severe spinal canal narrowing. Upper chest: Acute mildly displaced fracture of the posterior right first rib. Partially visualized mildly  displaced fracture of the right midclavicle. Other: None. IMPRESSION: 1. No acute intracranial abnormality. 2. Right posterior scalp hematoma. No underlying calvarial fracture. 3. No acute fracture in the cervical spine. 4. Acute mildly displaced fracture of the posterior right first rib. 5. Partially visualized mildly displaced fracture of the right mid clavicle. Electronically Signed   By: Norman Gatlin M.D.   On: 02/10/2024 03:34   CT Cervical Spine Wo Contrast Result Date: 02/10/2024 CLINICAL DATA:  Head trauma, fell down a flight of steps. EXAM: CT HEAD WITHOUT CONTRAST CT CERVICAL SPINE WITHOUT CONTRAST TECHNIQUE: Multidetector CT imaging of the head and cervical spine was performed following the standard protocol without intravenous contrast. Multiplanar CT image reconstructions of the cervical spine were also generated. RADIATION DOSE REDUCTION: This exam was performed according to the departmental dose-optimization program which includes automated exposure control, adjustment of the mA and/or kV according to patient size and/or use of iterative reconstruction technique. COMPARISON:  None Available. FINDINGS: CT HEAD FINDINGS Brain: No intracranial hemorrhage, mass effect, or evidence of acute infarct. No hydrocephalus. No extra-axial fluid collection. Vascular: No hyperdense vessel or unexpected calcification. Skull: No fracture or focal lesion.  Right posterior scalp hematoma. Sinuses/Orbits: No acute finding. Paranasal sinuses and mastoid air cells are well aerated. Other: None. CT CERVICAL SPINE FINDINGS Alignment: No evidence of traumatic malalignment. Skull base and vertebrae: No acute fracture. No primary bone lesion or focal pathologic process. Soft tissues and spinal canal: No prevertebral fluid or swelling. No visible canal hematoma. Disc levels: Multilevel spondylosis with osteophytic spurring, disc space height loss, and degenerative  endplate changes greatest at C4 through C7 where it is  moderate to advanced. Multilevel posterior disc osteophyte complexes cause mild effacement of the ventral thecal sac. No severe spinal canal narrowing. Upper chest: Acute mildly displaced fracture of the posterior right first rib. Partially visualized mildly displaced fracture of the right midclavicle. Other: None. IMPRESSION: 1. No acute intracranial abnormality. 2. Right posterior scalp hematoma. No underlying calvarial fracture. 3. No acute fracture in the cervical spine. 4. Acute mildly displaced fracture of the posterior right first rib. 5. Partially visualized mildly displaced fracture of the right mid clavicle. Electronically Signed   By: Norman Gatlin M.D.   On: 02/10/2024 03:34   DG Shoulder Right Addendum Date: 02/10/2024 ADDENDUM REPORT: 02/10/2024 03:18 ADDENDUM: After comparing with today's chest x-ray, multiple right rib fractures are noted involving the 2nd through 5th ribs. Electronically Signed   By: Franky Crease M.D.   On: 02/10/2024 03:18   Result Date: 02/10/2024 CLINICAL DATA:  Fall, posterior shoulder pain EXAM: RIGHT SHOULDER - 2+ VIEW COMPARISON:  None Available. FINDINGS: There is no evidence of fracture or dislocation. There is no evidence of arthropathy or other focal bone abnormality. Soft tissues are unremarkable. IMPRESSION: Negative. Electronically Signed: By: Franky Crease M.D. On: 02/10/2024 03:15   DG Chest 2 View Result Date: 02/10/2024 CLINICAL DATA:  Fall, right shoulder pain, chest pain EXAM: CHEST - 2 VIEW COMPARISON:  02/07/2012 FINDINGS: Mild elevation of the right hemidiaphragm. Heart is borderline in size. Mild vascular congestion. No confluent opacities, effusions or edema. Multiple right rib fractures involving the 2nd through 5th ribs. No pneumothorax. IMPRESSION: Right 2nd through 5th rib fractures. No visible effusion or pneumothorax. Vascular congestion. Electronically Signed   By: Franky Crease M.D.   On: 02/10/2024 03:17     Procedures   Medications  Ordered in the ED  fentaNYL  (SUBLIMAZE ) injection 50 mcg (50 mcg Intravenous Given 02/10/24 0429)  ondansetron  (ZOFRAN ) injection 4 mg (4 mg Intravenous Given 02/10/24 0428)  iohexol  (OMNIPAQUE ) 300 MG/ML solution 100 mL (100 mLs Intravenous Contrast Given 02/10/24 0443)  morphine  (PF) 4 MG/ML injection 4 mg (4 mg Intravenous Given 02/10/24 9366)                                    Medical Decision Making Amount and/or Complexity of Data Reviewed Labs: ordered. Radiology: ordered.  Risk Prescription drug management. Decision regarding hospitalization.   Patient with history of hypertension, diabetes here for evaluation after a fall down the stairs. She has tenderness over her right shoulder, clavicle. Plan films are significant for right mid shaft clavicle fracture as well as multiple rib fractures. Trauma imaging was obtained given a mechanism and her multiple rib fractures. Imaging is significant for six rib fractures, no additional significant finding. Patient placed in his sling for comfort. Patient did have hypoxia with pain medication administration. Given the number of fractures plan to admit for pain control. Discussed with Dr. Sheldon with general surgery - recommend medicine admission to [facility] with trauma consult. Medicine consulted for admission for ongoing care. Patient updated of findings of studies and recommendation for admission and she is in agreement with treatment plan.     Final diagnoses:  Closed fracture of multiple ribs of right side, initial encounter  Fall, initial encounter  Closed displaced fracture of shaft of right clavicle, initial encounter    ED Discharge Orders  None          Griselda Norris, MD 02/10/24 [phone]

## 2024-02-10 NOTE — TOC Initial Note (Signed)
 Transition of Care [facility]) - Initial/Assessment Note    Patient Details  Name: Patricia Delacruz MRN: [id number] Date of Birth: 09-Apr-1956  Transition of Care [facility]) CM/SW Contact:    Jeoffrey LITTIE Moose, [place] Phone Number: 02/10/2024, 9:27 AM  Clinical Narrative:                 Pt admitted from home due to fall down stairs. No current TOC needs, please consult as needs arise following therapy eval.         Patient Goals and CMS Choice            Expected Discharge Plan and Services                                              Prior Living Arrangements/Services                       Activities of Daily Living      Permission Sought/Granted                  Emotional Assessment              Admission diagnosis:  Multiple rib fractures [S22.49XA] Fall, initial encounter [W19.XXXA] Closed displaced fracture of shaft of right clavicle, initial encounter [S42.021A] Closed fracture of multiple ribs of right side, initial encounter [S22.41XA] Patient Active Problem List   Diagnosis Date Noted   Fracture of multiple ribs 02/10/2024   Clavicle fracture 02/10/2024   Fall 02/10/2024   Multiple rib fractures 02/10/2024   Diabetes mellitus due to underlying condition with unspecified complications (HCC) 09/29/2023   Cardiac murmur 09/29/2023   Dyspnea on exertion 09/29/2023   Obesity (BMI 35.0-39.9 without comorbidity) 09/29/2023   Complication of anesthesia    Condyloma acuminata    High risk HPV infection    History of kidney stones    Hyperlipidemia    Hypertension    Surgical menopause 09/28/2013   Uterus fibroma 02/02/2012   Condyloma acuminatum of vulva 01/28/2012   CIN II (cervical intraepithelial neoplasia II) 01/28/2012   Depression 01/28/2012   Anxiety 01/28/2012   PCP:  Valma Carwin, MD Pharmacy:   [facility] [phone] - [place], [place] - [address] [place]  [address] Phone: [phone] Fax: [phone]  [facility] [address] [place] [address] Phone: [phone] Fax: [phone]     Social Drivers of Health (SDOH) Social History: SDOH Screenings   Tobacco Use: Low Risk  (02/10/2024)   SDOH Interventions:     Readmission Risk Interventions     No data to display

## 2024-02-10 NOTE — TOC Initial Note (Signed)
 Transition of Care (TOC) - Initial/Assessment Note   Spoke to patient and husband at bedside. Agreeable to HHPT/OT [name] accepted . Asked MD to sign orders   Patient has a raise commode at home, rollator and shower seat  Patient Details  Name: Patricia Delacruz MRN: [id number] Date of Birth: Mar 16, 1956  Transition of Care [facility]) CM/SW Contact:    Stephane Powell Jansky, RN Phone Number: 02/10/2024, 3:10 PM  Clinical Narrative:                   Expected Discharge Plan: Home w Home Health Services Barriers to Discharge: Continued Medical Work up   Patient Goals and CMS Choice Patient states their goals for this hospitalization and ongoing recovery are:: to return to home CMS Medicare.gov Compare Post Acute Care list provided to:: Patient Choice offered to / list presented to : Patient      Expected Discharge Plan and Services   Discharge Planning Services: CM Consult Post Acute Care Choice: Home Health Living arrangements for the past 2 months: Single Family Home                 DME Arranged: N/A         HH Arranged: PT, OT HH Agency: [facility] Date [facility] Agency Contacted: 02/10/24 Time HH Agency Contacted: 1507 Representative spoke with at [facility] Agency: [name]  Prior Living Arrangements/Services Living arrangements for the past 2 months: Single Family Home Lives with:: Spouse Patient language and need for interpreter reviewed:: Yes Do you feel safe going back to the place where you live?: Yes      Need for Family Participation in Patient Care: Yes (Comment) Care giver support system in place?: Yes (comment) Current home services: DME Criminal Activity/Legal Involvement Pertinent to Current Situation/Hospitalization: No - Comment as needed  Activities of Daily Living   ADL Screening (condition at time of admission) Independently performs ADLs?: Yes (appropriate for developmental age) Is the patient deaf or have difficulty hearing?: No Does the patient have  difficulty seeing, even when wearing glasses/contacts?: No Does the patient have difficulty concentrating, remembering, or making decisions?: No  Permission Sought/Granted   Permission granted to share information with : Yes, Verbal Permission Granted  Share Information with NAME: husband Debby  Permission granted to share info w AGENCY: [name]        Emotional Assessment Appearance:: Appears stated age Attitude/Demeanor/Rapport: Engaged Affect (typically observed): Appropriate Orientation: : Oriented to Self, Oriented to Place, Oriented to  Time, Oriented to Situation Alcohol / Substance Use: Not Applicable Psych Involvement: No (comment)  Admission diagnosis:  Multiple rib fractures [S22.49XA] Fall, initial encounter [W19.XXXA] Closed displaced fracture of shaft of right clavicle, initial encounter [S42.021A] Closed fracture of multiple ribs of right side, initial encounter [S22.41XA] Patient Active Problem List   Diagnosis Date Noted   Fracture of multiple ribs 02/10/2024   Clavicle fracture 02/10/2024   Fall 02/10/2024   Multiple rib fractures 02/10/2024   Type 2 diabetes mellitus with hyperglycemia (HCC) 02/10/2024   Grade I diastolic dysfunction 02/10/2024   Diabetes mellitus due to underlying condition with unspecified complications (HCC) 09/29/2023   Cardiac murmur 09/29/2023   Dyspnea on exertion 09/29/2023   Obesity (BMI 35.0-39.9 without comorbidity) 09/29/2023   Complication of anesthesia    Condyloma acuminata    High risk HPV infection    History of kidney stones    Hyperlipidemia    Hypertension    Surgical menopause 09/28/2013   Uterus fibroma 02/02/2012  Condyloma acuminatum of vulva 01/28/2012   CIN II (cervical intraepithelial neoplasia II) 01/28/2012   Depression 01/28/2012   Anxiety 01/28/2012   PCP:  Valma Carwin, MD Pharmacy:   [facility] [phone] - [place], [place] - [address] [place] [address] Phone: [phone] Fax: [phone]  [facility] [address] [place] [address] Phone: [phone] Fax: [phone]     Social Drivers of Health (SDOH) Social History: SDOH Screenings   Food Insecurity: No Food Insecurity (02/10/2024)  Housing: Unknown (02/10/2024)  Transportation Needs: Unknown (02/10/2024)  Utilities: Not At Risk (02/10/2024)  Tobacco Use: Low Risk  (02/10/2024)   SDOH Interventions:     Readmission Risk Interventions     No data to display

## 2024-02-10 NOTE — Consult Note (Signed)
 Consult Note  Danniela Mcbrearty 1955-11-19  [id number].    Requesting MD: Almarie Burner, MD Chief Complaint/Reason for Consult: Fall down stairs  HPI:  Patient is a 68 year old female who was brought to [facility] via EMS s/p fall down a flight of stairs. She reports she lost her balance at the top of the stairs in her home but denies LOC or striking head. Denies feeling lightheaded before falling. She fell primarily on her right shoulder. She is not on any blood thinners. She does not use a walker/cane or other assistive device at baseline. Lives with her husband and they do have a few stairs to enter the home as well as stairs in the home. She does report they have a half bath on the first floor of the home if she were to need to be downstairs primarily when first returning home. PMH otherwise significant for HTN, HLD, T2DM, Cardiac murmur, DOE, Obesity class II - BMI 36.0, Hx of cervical/vulvar cancer, and Depression/anxiety. She has intolerances to statins and tetracyclines. She denies alcohol, illicit drug or tobacco use.  Denies pain other than right shoulder and ribs.   ROS: Negative other than HPI  Family History  Problem Relation Age of Onset   Cancer Mother        LYMPHOMA   Diabetes Father    Hypertension Father     Past Medical History:  Diagnosis Date   Anxiety    CIN II (cervical intraepithelial neoplasia II)    Complication of anesthesia    pt feels like I can't breathe after surgery   Condyloma acuminata    VULVAR   Condyloma acuminatum of vulva 01/28/2012   Depression    High risk HPV infection    History of kidney stones    Hyperlipidemia    Hypertension    Surgical menopause 09/28/2013   Uterus fibroma 02/02/2012    Past Surgical History:  Procedure Laterality Date   CESAREAN SECTION  1990   CHOLECYSTECTOMY  2011   CO 2 LASER      CERVIX /VAGINA   COLONOSCOPY  NOV/03/2008   NEXT IN 10 YEARS   EXTRACORPOREAL SHOCK WAVE LITHOTRIPSY Right 10/09/2018    Procedure: EXTRACORPOREAL SHOCK WAVE LITHOTRIPSY (ESWL);  Surgeon: Carolee Sherwood JONETTA DOUGLAS, MD;  Location: [facility];  Service: Urology;  Laterality: Right;   LAPAROSCOPY  03/13/2012   Procedure: LAPAROSCOPY OPERATIVE;  Surgeon: Curlee VEAR Guan, MD;  Location: [facility];  Service: Gynecology;  Laterality: N/A;   SALPINGOOPHORECTOMY  03/13/2012   Procedure: SALPINGO OOPHERECTOMY;  Surgeon: Curlee VEAR Guan, MD;  Location: [facility];  Service: Gynecology;  Laterality: Bilateral;    Social History:  reports that she has never smoked. She has never used smokeless tobacco. She reports current alcohol use. She reports that she does not use drugs.  Allergies:  Allergies  Allergen Reactions   Statins    Tetracyclines & Related Photosensitivity    (Not in a hospital admission)   Blood pressure (!) 146/76, pulse 78, temperature 98.2 F (36.8 C), resp. rate 18, last menstrual period 05/18/2010, SpO2 98%. Physical Exam:  General: pleasant, WD, obese female who is laying in bed in NAD HEENT: head is normocephalic, atraumatic.  Sclera are noninjected.  Pupils equal and round  Ears and nose without any masses or lesions.  Mouth is pink and moist Heart: regular, rate, and rhythm. Palpable radial and pedal pulses bilaterally Lungs:No wheezes, rhonchi, or rales noted.  Respiratory effort nonlabored on  2 L via nasal cannula  Abd: soft, NT, ND MS: RUE in sling, R hand NVI Skin: warm and dry with no masses, lesions, or rashes Neuro: Cranial nerves 2-12 grossly intact, sensation is normal throughout Psych: A&Ox3 with an appropriate affect.   Results for orders placed or performed during the hospital encounter of 02/10/24 (from the past 48 hours)  Comprehensive metabolic panel     Status: Abnormal   Collection Time: 02/10/24  3:38 AM  Result Value Ref Range   Sodium 135 135 - 145 mmol/L   Potassium 3.5 3.5 - 5.1 mmol/L   Chloride 100 98 - 111 mmol/L   CO2 25 22 - 32 mmol/L   Glucose, Bld 231 (H) 70 - 99 mg/dL     Comment: Glucose reference range applies only to samples taken after fasting for at least 8 hours.   BUN 25 (H) 8 - 23 mg/dL   Creatinine, Ser 9.17 0.44 - 1.00 mg/dL   Calcium  8.4 (L) 8.9 - 10.3 mg/dL   Total Protein 6.7 6.5 - 8.1 g/dL   Albumin 3.0 (L) 3.5 - 5.0 g/dL   AST 19 15 - 41 U/L   ALT 20 0 - 44 U/L   Alkaline Phosphatase 66 38 - 126 U/L   Total Bilirubin 0.3 0.0 - 1.2 mg/dL   GFR, Estimated >39 >39 mL/min    Comment: (NOTE) Calculated using the CKD-EPI Creatinine Equation (2021)    Anion gap 10 5 - 15    Comment: Performed at [facility], [address], [place], [place] [address]  CBC with Differential     Status: Abnormal   Collection Time: 02/10/24  3:38 AM  Result Value Ref Range   WBC 14.9 (H) 4.0 - 10.5 K/uL   RBC 4.54 3.87 - 5.11 MIL/uL   Hemoglobin 11.7 (L) 12.0 - 15.0 g/dL   HCT 61.0 63.9 - 53.9 %   MCV 85.7 80.0 - 100.0 fL   MCH 25.8 (L) 26.0 - 34.0 pg   MCHC 30.1 30.0 - 36.0 g/dL   RDW 84.0 (H) 88.4 - 84.4 %   Platelets 225 150 - 400 K/uL   nRBC 0.0 0.0 - 0.2 %   Neutrophils Relative % 84 %   Neutro Abs 12.5 (H) 1.7 - 7.7 K/uL   Lymphocytes Relative 9 %   Lymphs Abs 1.3 0.7 - 4.0 K/uL   Monocytes Relative 6 %   Monocytes Absolute 0.9 0.1 - 1.0 K/uL   Eosinophils Relative 0 %   Eosinophils Absolute 0.0 0.0 - 0.5 K/uL   Basophils Relative 0 %   Basophils Absolute 0.1 0.0 - 0.1 K/uL   Immature Granulocytes 1 %   Abs Immature Granulocytes 0.08 (H) 0.00 - 0.07 K/uL    Comment: Performed at [facility], [address], [place], [place] [address]   CT T-SPINE NO CHARGE Result Date: 02/10/2024 CLINICAL DATA:  Clemens down a flight of stairs. Multiple known rib fractures. Back pain. EXAM: CT THORACIC AND LUMBAR SPINE WITHOUT CONTRAST TECHNIQUE: Multidetector CT imaging of the thoracic and lumbar spine was performed without intravenous contrast. Multiplanar CT image reconstructions were also generated. RADIATION DOSE  REDUCTION: This exam was performed according to the departmental dose-optimization program which includes automated exposure control, adjustment of the mA and/or kV according to patient size and/or use of iterative reconstruction technique. COMPARISON:  PA Lat chest from today and 02/07/2012. CT abdomen pelvis and reconstructions 08/22/2018. FINDINGS: CT THORACIC SPINE FINDINGS Segmentation: There  are 12 rib-bearing thoracic type vertebrae. Alignment: There is mild dextroscoliosis but no AP listhesis. Vertebrae: Osteopenia. No thoracic spine fractures or focal pathologic process identified. There is thoracic spondylosis and multilevel endplate Schmorl's nodes beginning at T5, but also seen at T2-3 T3-4. Paraspinal and other soft tissues: No paraspinal mass, hematoma or fluid collections. Other soft tissue structures reported separately. Moderate hiatal hernia. Cardiomegaly with CAD. No pneumothorax. Disc levels: The thoracic discs are diffusely degenerated. The spinal canal is not optimally seen due to beam hardening related to body habitus. There is no obvious significant soft tissue or bony encroachment on the thecal sac such as due to herniated discs or spinal canal hematoma. The spinal canal appears widely patent as well as can be seen. There are slight facet spurs at lower thoracic levels but no levels demonstrate significant foraminal stenosis. Other: Midshaft right clavicle acute fracture with overriding and mild stranding. Nondisplaced fracture posteromedial right first rib, anterior right second rib, with displaced fractures of the posterior right third through fifth ribs, posterolateral right sixth rib. CT LUMBAR SPINE FINDINGS Segmentation: Standard. Alignment: Trace chronic discogenic L2-3 retrolisthesis. Slight dextroscoliosis. Otherwise normal alignment. Vertebrae: Osteopenia without evidence of fractures. There is mild chronic anterior wedging of the L1 vertebral body, but no more than previously.  There is moderate spondylosis, greatest at L1-2, L2-3, L5-S1. Paraspinal and other soft tissues: Reported separately. No acute findings. Disc levels: The discs are chronically collapsed, with vacuum phenomenon and endplate irregularities, at all lumbar levels except for a T12-L1 and L4-5, both of which have relatively normal disc heights. At L3-4, a circumferential disc osteophyte complex and dorsal ligamentous hypertrophy cause moderate spinal canal stenosis. There is mild spinal stenosis at L4-5 due to a disc bulge and slightly thickened ligaments. Moderate spinal stenosis is seen again at L5-S1 due to bidirectional osteophytes and a calcified central disc extrusion. There is relatively mild facet joint hypertrophy. Along with the spondylosis this is associated with foraminal stenosis which is mild-to-moderate on the left at L2-3, bilaterally mild at L3-4, mild-to-moderate on the left at L4-5, and bilaterally moderate to severe at L5-S1. Both SI joints are patent with spurring and vacuum phenomenon. IMPRESSION: 1. Osteopenia and degenerative change without evidence of acute fractures of the thoracic or lumbar spine. 2. Multiple right rib fractures and right clavicle fracture with override. 3. Cardiomegaly with CAD. 4. Moderate hiatal hernia. 5. Chronic mild anterior wedging of the L1 vertebral body. 6. Multilevel degenerative disc and joint changes with spinal stenosis at L3-4, L4-5 and L5-S1. 7. Foraminal stenosis greatest at L5-S1. Electronically Signed   By: Francis Quam M.D.   On: 02/10/2024 06:20   CT L-SPINE NO CHARGE Result Date: 02/10/2024 CLINICAL DATA:  Clemens down a flight of stairs. Multiple known rib fractures. Back pain. EXAM: CT THORACIC AND LUMBAR SPINE WITHOUT CONTRAST TECHNIQUE: Multidetector CT imaging of the thoracic and lumbar spine was performed without intravenous contrast. Multiplanar CT image reconstructions were also generated. RADIATION DOSE REDUCTION: This exam was performed  according to the departmental dose-optimization program which includes automated exposure control, adjustment of the mA and/or kV according to patient size and/or use of iterative reconstruction technique. COMPARISON:  PA Lat chest from today and 02/07/2012. CT abdomen pelvis and reconstructions 08/22/2018. FINDINGS: CT THORACIC SPINE FINDINGS Segmentation: There are 12 rib-bearing thoracic type vertebrae. Alignment: There is mild dextroscoliosis but no AP listhesis. Vertebrae: Osteopenia. No thoracic spine fractures or focal pathologic process identified. There is thoracic spondylosis and multilevel endplate Schmorl's nodes  beginning at T5, but also seen at T2-3 T3-4. Paraspinal and other soft tissues: No paraspinal mass, hematoma or fluid collections. Other soft tissue structures reported separately. Moderate hiatal hernia. Cardiomegaly with CAD. No pneumothorax. Disc levels: The thoracic discs are diffusely degenerated. The spinal canal is not optimally seen due to beam hardening related to body habitus. There is no obvious significant soft tissue or bony encroachment on the thecal sac such as due to herniated discs or spinal canal hematoma. The spinal canal appears widely patent as well as can be seen. There are slight facet spurs at lower thoracic levels but no levels demonstrate significant foraminal stenosis. Other: Midshaft right clavicle acute fracture with overriding and mild stranding. Nondisplaced fracture posteromedial right first rib, anterior right second rib, with displaced fractures of the posterior right third through fifth ribs, posterolateral right sixth rib. CT LUMBAR SPINE FINDINGS Segmentation: Standard. Alignment: Trace chronic discogenic L2-3 retrolisthesis. Slight dextroscoliosis. Otherwise normal alignment. Vertebrae: Osteopenia without evidence of fractures. There is mild chronic anterior wedging of the L1 vertebral body, but no more than previously. There is moderate spondylosis,  greatest at L1-2, L2-3, L5-S1. Paraspinal and other soft tissues: Reported separately. No acute findings. Disc levels: The discs are chronically collapsed, with vacuum phenomenon and endplate irregularities, at all lumbar levels except for a T12-L1 and L4-5, both of which have relatively normal disc heights. At L3-4, a circumferential disc osteophyte complex and dorsal ligamentous hypertrophy cause moderate spinal canal stenosis. There is mild spinal stenosis at L4-5 due to a disc bulge and slightly thickened ligaments. Moderate spinal stenosis is seen again at L5-S1 due to bidirectional osteophytes and a calcified central disc extrusion. There is relatively mild facet joint hypertrophy. Along with the spondylosis this is associated with foraminal stenosis which is mild-to-moderate on the left at L2-3, bilaterally mild at L3-4, mild-to-moderate on the left at L4-5, and bilaterally moderate to severe at L5-S1. Both SI joints are patent with spurring and vacuum phenomenon. IMPRESSION: 1. Osteopenia and degenerative change without evidence of acute fractures of the thoracic or lumbar spine. 2. Multiple right rib fractures and right clavicle fracture with override. 3. Cardiomegaly with CAD. 4. Moderate hiatal hernia. 5. Chronic mild anterior wedging of the L1 vertebral body. 6. Multilevel degenerative disc and joint changes with spinal stenosis at L3-4, L4-5 and L5-S1. 7. Foraminal stenosis greatest at L5-S1. Electronically Signed   By: Francis Quam M.D.   On: 02/10/2024 06:20   CT CHEST ABDOMEN PELVIS W CONTRAST Result Date: 02/10/2024 CLINICAL DATA:  Patient fell down a flight of stairs. Pain in multiple areas. Right rib fractures on chest x-ray. EXAM: CT CHEST, ABDOMEN, AND PELVIS WITH CONTRAST TECHNIQUE: Multidetector CT imaging of the chest, abdomen and pelvis was performed following the standard protocol during bolus administration of intravenous contrast. RADIATION DOSE REDUCTION: This exam was performed  according to the departmental dose-optimization program which includes automated exposure control, adjustment of the mA and/or kV according to patient size and/or use of iterative reconstruction technique. CONTRAST:  OMNIPAQUE  IOHEXOL  300 MG/ML  SOLN COMPARISON:  CT stone study 03/29/2018 FINDINGS: CT CHEST FINDINGS Cardiovascular: Heart size upper normal. No substantial pericardial effusion. Mild atherosclerotic calcification is noted in the wall of the thoracic aorta. Mediastinum/Nodes: No mediastinal edema or hemorrhage. No mediastinal lymphadenopathy. There is no hilar lymphadenopathy. Small hiatal hernia. The esophagus has normal imaging features. There is no axillary lymphadenopathy. Lungs/Pleura: No evidence for pneumothorax. No substantial pleural effusion. Dependent atelectasis noted in both lower lungs. No  focal airspace consolidation. No suspicious pulmonary nodule or mass. Musculoskeletal: Acute fracture noted mid right clavicle. Acute fractures identified in the posterior right first rib. Suspect nondisplaced fracture posterior right second rib. Acute fractures identified posterior right third through sixth ribs. No evidence for left-sided rib fracture. No sternal fracture. No obvious thoracic spine fracture but see thoracic spine CT report for definitive thoracic spine characterization, dictated separately. CT ABDOMEN PELVIS FINDINGS Hepatobiliary: No suspicious focal abnormality within the liver parenchyma. Gallbladder is surgically absent. No intrahepatic or extrahepatic biliary dilation. Pancreas: No focal mass lesion. No dilatation of the main duct. No intraparenchymal cyst. No peripancreatic edema. Spleen: No splenomegaly. No suspicious focal mass lesion. Adrenals/Urinary Tract: No adrenal nodule or mass. Central sinus cysts are noted in the kidneys bilaterally. No followup imaging is recommended. Kidneys otherwise unremarkable. No evidence for hydroureter. The urinary bladder appears normal  for the degree of distention. Stomach/Bowel: Small hiatal hernia. Stomach otherwise unremarkable. Duodenum is normally positioned as is the ligament of Treitz. No small bowel wall thickening. No small bowel dilatation. The terminal ileum is normal. The appendix is normal. No gross colonic mass. No colonic wall thickening. Vascular/Lymphatic: There is mild atherosclerotic calcification of the abdominal aorta without aneurysm. There is no gastrohepatic or hepatoduodenal ligament lymphadenopathy. No retroperitoneal or mesenteric lymphadenopathy. No pelvic sidewall lymphadenopathy. Reproductive: The uterus is unremarkable.  There is no adnexal mass. Other: No intraperitoneal free fluid. Musculoskeletal: No evidence for lumbar spine fracture, but please see report for dedicated lumbar spine CT dictated separately. No evidence for an acute fracture in the bony anatomy of the pelvis. Small umbilical hernia contains only fat. IMPRESSION: 1. Acute fracture mid right clavicle. 2. Acute fractures posterior right first and third through sixth ribs. Suspect nondisplaced fracture posterior right second rib. No evidence for pneumothorax or pleural effusion. 3. No evidence for acute traumatic injury in the abdomen or pelvis. No intraperitoneal free fluid. 4. Small hiatal hernia. 5.  Aortic Atherosclerosis (ICD10-I70.0). Electronically Signed   By: Camellia Candle M.D.   On: 02/10/2024 06:00   CT Head Wo Contrast Result Date: 02/10/2024 CLINICAL DATA:  Head trauma, fell down a flight of steps. EXAM: CT HEAD WITHOUT CONTRAST CT CERVICAL SPINE WITHOUT CONTRAST TECHNIQUE: Multidetector CT imaging of the head and cervical spine was performed following the standard protocol without intravenous contrast. Multiplanar CT image reconstructions of the cervical spine were also generated. RADIATION DOSE REDUCTION: This exam was performed according to the departmental dose-optimization program which includes automated exposure control,  adjustment of the mA and/or kV according to patient size and/or use of iterative reconstruction technique. COMPARISON:  None Available. FINDINGS: CT HEAD FINDINGS Brain: No intracranial hemorrhage, mass effect, or evidence of acute infarct. No hydrocephalus. No extra-axial fluid collection. Vascular: No hyperdense vessel or unexpected calcification. Skull: No fracture or focal lesion.  Right posterior scalp hematoma. Sinuses/Orbits: No acute finding. Paranasal sinuses and mastoid air cells are well aerated. Other: None. CT CERVICAL SPINE FINDINGS Alignment: No evidence of traumatic malalignment. Skull base and vertebrae: No acute fracture. No primary bone lesion or focal pathologic process. Soft tissues and spinal canal: No prevertebral fluid or swelling. No visible canal hematoma. Disc levels: Multilevel spondylosis with osteophytic spurring, disc space height loss, and degenerative endplate changes greatest at C4 through C7 where it is moderate to advanced. Multilevel posterior disc osteophyte complexes cause mild effacement of the ventral thecal sac. No severe spinal canal narrowing. Upper chest: Acute mildly displaced fracture of the posterior right first rib. Partially  visualized mildly displaced fracture of the right midclavicle. Other: None. IMPRESSION: 1. No acute intracranial abnormality. 2. Right posterior scalp hematoma. No underlying calvarial fracture. 3. No acute fracture in the cervical spine. 4. Acute mildly displaced fracture of the posterior right first rib. 5. Partially visualized mildly displaced fracture of the right mid clavicle. Electronically Signed   By: Norman Gatlin M.D.   On: 02/10/2024 03:34   CT Cervical Spine Wo Contrast Result Date: 02/10/2024 CLINICAL DATA:  Head trauma, fell down a flight of steps. EXAM: CT HEAD WITHOUT CONTRAST CT CERVICAL SPINE WITHOUT CONTRAST TECHNIQUE: Multidetector CT imaging of the head and cervical spine was performed following the standard protocol  without intravenous contrast. Multiplanar CT image reconstructions of the cervical spine were also generated. RADIATION DOSE REDUCTION: This exam was performed according to the departmental dose-optimization program which includes automated exposure control, adjustment of the mA and/or kV according to patient size and/or use of iterative reconstruction technique. COMPARISON:  None Available. FINDINGS: CT HEAD FINDINGS Brain: No intracranial hemorrhage, mass effect, or evidence of acute infarct. No hydrocephalus. No extra-axial fluid collection. Vascular: No hyperdense vessel or unexpected calcification. Skull: No fracture or focal lesion.  Right posterior scalp hematoma. Sinuses/Orbits: No acute finding. Paranasal sinuses and mastoid air cells are well aerated. Other: None. CT CERVICAL SPINE FINDINGS Alignment: No evidence of traumatic malalignment. Skull base and vertebrae: No acute fracture. No primary bone lesion or focal pathologic process. Soft tissues and spinal canal: No prevertebral fluid or swelling. No visible canal hematoma. Disc levels: Multilevel spondylosis with osteophytic spurring, disc space height loss, and degenerative endplate changes greatest at C4 through C7 where it is moderate to advanced. Multilevel posterior disc osteophyte complexes cause mild effacement of the ventral thecal sac. No severe spinal canal narrowing. Upper chest: Acute mildly displaced fracture of the posterior right first rib. Partially visualized mildly displaced fracture of the right midclavicle. Other: None. IMPRESSION: 1. No acute intracranial abnormality. 2. Right posterior scalp hematoma. No underlying calvarial fracture. 3. No acute fracture in the cervical spine. 4. Acute mildly displaced fracture of the posterior right first rib. 5. Partially visualized mildly displaced fracture of the right mid clavicle. Electronically Signed   By: Norman Gatlin M.D.   On: 02/10/2024 03:34   DG Shoulder Right Addendum Date:  02/10/2024 ADDENDUM REPORT: 02/10/2024 03:18 ADDENDUM: After comparing with today's chest x-ray, multiple right rib fractures are noted involving the 2nd through 5th ribs. Electronically Signed   By: Franky Crease M.D.   On: 02/10/2024 03:18   Result Date: 02/10/2024 CLINICAL DATA:  Fall, posterior shoulder pain EXAM: RIGHT SHOULDER - 2+ VIEW COMPARISON:  None Available. FINDINGS: There is no evidence of fracture or dislocation. There is no evidence of arthropathy or other focal bone abnormality. Soft tissues are unremarkable. IMPRESSION: Negative. Electronically Signed: By: Franky Crease M.D. On: 02/10/2024 03:15   DG Chest 2 View Result Date: 02/10/2024 CLINICAL DATA:  Fall, right shoulder pain, chest pain EXAM: CHEST - 2 VIEW COMPARISON:  02/07/2012 FINDINGS: Mild elevation of the right hemidiaphragm. Heart is borderline in size. Mild vascular congestion. No confluent opacities, effusions or edema. Multiple right rib fractures involving the 2nd through 5th ribs. No pneumothorax. IMPRESSION: Right 2nd through 5th rib fractures. No visible effusion or pneumothorax. Vascular congestion. Electronically Signed   By: Franky Crease M.D.   On: 02/10/2024 03:17      Assessment/Plan Fall down stairs R clavicle fracture - per ortho, consult pending  R 1st rib  fracture, and 3-6 rib fractures - multimodal pain control, IS, pulm toilet, recommend CTA given 1st rib fracture to r/o BCVI  Admit to internal medicine, transferring to [facility].   FEN: reg diet VTE: would recommend LMWH BID per trauma guidelines, ASA if BCVI noted on CTA ID: no current abx  - per TRH -  HTN HLD T2DM Cardiac murmur DOE Obesity class II - BMI 36.0  Hx of cervical/vulvar cancer  Depression/anxiety   I reviewed ED provider notes, hospitalist notes, last 24 h vitals and pain scores, last 48 h intake and output, last 24 h labs and trends, and last 24 h imaging results.  This care required high  level of medical decision making.    Burnard JONELLE Louder, [facility] Surgery 02/10/2024, 8:27 AM Please see Amion for pager number during day hours 7:00am-4:30pm

## 2024-02-10 NOTE — Consult Note (Signed)
 Orthopaedic Trauma Service (OTS) Consult   Patient ID: Patricia Delacruz MRN: [id number] DOB/AGE: 10/19/1955 68 y.o.  Reason for Consult: Right clavicle fracture Referring Provider: Burnard Louder, PA-C [facility] surgery)  HPI: Patricia Delacruz is a 68 y.o. female with past medical history significant for HTN, HLD, T2DM, Cardiac murmur, DOE, Obesity class II - BMI 36.0, Hx of cervical/vulvar cancer, and Depression/anxiety being seen in consultation at request of the trauma service for evaluation of right clavicle fracture.  Patient lost her balance and unfortunately had a fall down a flight of stairs yesterday.  Denies LOC.  Was initially evaluated in [facility] emergency department and found to have multiple broken ribs as well as a right clavicle fracture.  Due to the number of fractures, patient was transferred to Patricia Delacruz for admission to the hospitalist service.  Orthopedics was consulted for evaluation and management of the right clavicle fracture. Patient seen this morning on 6N.  Notes some pain through the right shoulder and ribs but denies pain throughout remainder of extremities.  No previous injury or surgery to the right upper extremity.  Patient is RHD.  Lives at home with her husband.  Does not use assistive device at baseline.  Not currently on any anticoagulation.  Patient with well-controlled diabetes (hgb A1c 6.7).  Past Medical History:  Diagnosis Date   Anxiety    CIN II (cervical intraepithelial neoplasia II)    Complication of anesthesia    pt feels like I can't breathe after surgery   Condyloma acuminata    VULVAR   Condyloma acuminatum of vulva 01/28/2012   Depression    High risk HPV infection    History of kidney stones    Hyperlipidemia    Hypertension    Surgical menopause 09/28/2013   Uterus fibroma 02/02/2012   Past Surgical History:  Procedure Laterality Date   CESAREAN SECTION  1990   CHOLECYSTECTOMY  2011   CO 2 LASER      CERVIX /VAGINA    COLONOSCOPY  NOV/03/2008   NEXT IN 10 YEARS   EXTRACORPOREAL SHOCK WAVE LITHOTRIPSY Right 10/09/2018   Procedure: EXTRACORPOREAL SHOCK WAVE LITHOTRIPSY (ESWL);  Surgeon: Carolee Sherwood JONETTA DOUGLAS, MD;  Location: [facility] ORS;  Service: Urology;  Laterality: Right;   LAPAROSCOPY  03/13/2012   Procedure: LAPAROSCOPY OPERATIVE;  Surgeon: Curlee VEAR Guan, MD;  Location: [facility] ORS;  Service: Gynecology;  Laterality: N/A;   SALPINGOOPHORECTOMY  03/13/2012   Procedure: SALPINGO OOPHERECTOMY;  Surgeon: Curlee VEAR Guan, MD;  Location: [facility] OR[facility];  Service: Gynecology;  Laterality: Bilateral;   Family History  Problem Relation Age of Onset   Cancer Mother        LYMPHOMA   Diabetes Father    Hypertension Father     Social History:  reports that she has never smoked. She has never used smokeless tobacco. She reports current alcohol use. She reports that she does not use drugs.  Allergies:  Allergies  Allergen Reactions   Statins Other (See Comments)    Myalgia    Tetracyclines & Related Photosensitivity    Medications: Prior to Admission medications   Medication Sig Start Date End Date Taking? Authorizing Provider  buPROPion  (WELLBUTRIN  XL) 300 MG 24 hr tablet Take 450 mg by mouth daily.   Yes [provider]  DULoxetine  (CYMBALTA ) 30 MG capsule Take 30 mg by mouth daily.   Yes [provider]  hydroxypropyl methylcellulose / hypromellose (ISOPTO TEARS / GONIOVISC) 2.5 % ophthalmic solution Place 1 drop into  both eyes daily as needed for dry eyes.   Yes [provider]  LORazepam  (ATIVAN ) 1 MG tablet Take 1 mg by mouth 2 (two) times daily as needed for anxiety, seizure, sedation or sleep. 12/20/23  Yes [provider]  omeprazole (PRILOSEC) 20 MG capsule Take 20 mg by mouth daily.   Yes [provider]  pioglitazone  (ACTOS ) 30 MG tablet Take 30 mg by mouth daily.   Yes [provider]  ramipril  (ALTACE ) 10 MG capsule Take 10 mg by mouth daily. 09/22/23  Yes  [provider]  zolpidem  (AMBIEN ) 10 MG tablet Take 10 mg by mouth at bedtime. 09/20/23  Yes [provider]  citalopram  (CELEXA ) 10 MG tablet Take 10 mg by mouth daily. Patient not taking: Reported on 02/10/2024    [provider]  rosuvastatin  (CRESTOR ) 10 MG tablet Take 1 tablet (10 mg total) by mouth daily. Patient not taking: Reported on 02/10/2024 09/29/23   Revankar, Rajan R, MD  Vitamin D, Ergocalciferol, (DRISDOL) 1.25 MG (50000 UNIT) CAPS capsule Take 50,000 Units by mouth once a week. Patient not taking: Reported on 02/10/2024 08/13/23   [provider]   I have reviewed the patient's current medications. Prior to Admission:  Medications Prior to Admission  Medication Sig Dispense Refill Last Dose/Taking   buPROPion  (WELLBUTRIN  XL) 300 MG 24 hr tablet Take 450 mg by mouth daily.   02/09/2024   DULoxetine  (CYMBALTA ) 30 MG capsule Take 30 mg by mouth daily.   02/09/2024   hydroxypropyl methylcellulose / hypromellose (ISOPTO TEARS / GONIOVISC) 2.5 % ophthalmic solution Place 1 drop into both eyes daily as needed for dry eyes.   02/09/2024   LORazepam  (ATIVAN ) 1 MG tablet Take 1 mg by mouth 2 (two) times daily as needed for anxiety, seizure, sedation or sleep.   02/09/2024   omeprazole (PRILOSEC) 20 MG capsule Take 20 mg by mouth daily.   02/09/2024   pioglitazone  (ACTOS ) 30 MG tablet Take 30 mg by mouth daily.   02/09/2024   ramipril  (ALTACE ) 10 MG capsule Take 10 mg by mouth daily.   02/09/2024   zolpidem  (AMBIEN ) 10 MG tablet Take 10 mg by mouth at bedtime.   02/09/2024   citalopram  (CELEXA ) 10 MG tablet Take 10 mg by mouth daily. (Patient not taking: Reported on 02/10/2024)   Not Taking   rosuvastatin  (CRESTOR ) 10 MG tablet Take 1 tablet (10 mg total) by mouth daily. (Patient not taking: Reported on 02/10/2024) 90 tablet 3 Not Taking   Vitamin D, Ergocalciferol, (DRISDOL) 1.25 MG (50000 UNIT) CAPS capsule Take 50,000 Units by mouth once a week. (Patient not  taking: Reported on 02/10/2024)   Not Taking    Positive ROS: All other systems have been reviewed and were otherwise negative with the exception of those mentioned in the HPI and as above.  Exam: Blood pressure (!) 143/57, pulse 81, temperature 98 F (36.7 C), temperature source Oral, resp. rate 16, last menstrual period 05/18/2010, SpO2 97%. General: Alert and oriented, no acute distress Cardiovascular: No pedal edema Respiratory: No cyanosis, no use of accessory musculature GI: No organomegaly, abdomen is soft and non-tender Skin: No lesions in the area of chief complaint Neurologic: Sensation intact distally Psychiatric: Patient is competent for consent with normal mood and affect  Musculoskeletal: RUE: Shoulder immobilizer in place.  No significant bruising or swelling noted about the clavicle or shoulder.  Does have some tenderness over the midshaft clavicle.  No crepitus.  Less tender over the  lateral and posterior shoulder or throughout the remainder of the extremity.  Tolerates gentle elbow and wrist range of motion.  Motor and sensory function intact to the median, ulnar, radial nerve distribution.  Sensation intact over the axillary nerve distribution.  Did not attempt any shoulder ROM given known fracture.  Fingers are warm and well-perfused. + Radial pulse  LUE/BLE: Superficial abrasion to right knee but otherwise skin without lesions. No tenderness to palpation. Full painless ROM, full strength in each muscle group without evidence of instability. Motor/sensory function at baseline. Neurovascularly intact.   Medical Decision Making: Data: Imaging: Two views of the right shoulder as well as CT CAP show minimally displaced right midshaft clavicle fracture  Labs:  Results for orders placed or performed during the hospital encounter of 02/10/24 (from the past 24 hours)  Comprehensive metabolic panel     Status: Abnormal   Collection Time: 02/10/24  3:38 AM  Result Value Ref  Range   Sodium 135 135 - 145 mmol/L   Potassium 3.5 3.5 - 5.1 mmol/L   Chloride 100 98 - 111 mmol/L   CO2 25 22 - 32 mmol/L   Glucose, Bld 231 (H) 70 - 99 mg/dL   BUN 25 (H) 8 - 23 mg/dL   Creatinine, Ser 9.17 0.44 - 1.00 mg/dL   Calcium  8.4 (L) 8.9 - 10.3 mg/dL   Total Protein 6.7 6.5 - 8.1 g/dL   Albumin 3.0 (L) 3.5 - 5.0 g/dL   AST 19 15 - 41 U/L   ALT 20 0 - 44 U/L   Alkaline Phosphatase 66 38 - 126 U/L   Total Bilirubin 0.3 0.0 - 1.2 mg/dL   GFR, Estimated >39 >39 mL/min   Anion gap 10 5 - 15  CBC with Differential     Status: Abnormal   Collection Time: 02/10/24  3:38 AM  Result Value Ref Range   WBC 14.9 (H) 4.0 - 10.5 K/uL   RBC 4.54 3.87 - 5.11 MIL/uL   Hemoglobin 11.7 (L) 12.0 - 15.0 g/dL   HCT 61.0 63.9 - 53.9 %   MCV 85.7 80.0 - 100.0 fL   MCH 25.8 (L) 26.0 - 34.0 pg   MCHC 30.1 30.0 - 36.0 g/dL   RDW 84.0 (H) 88.4 - 84.4 %   Platelets 225 150 - 400 K/uL   nRBC 0.0 0.0 - 0.2 %   Neutrophils Relative % 84 %   Neutro Abs 12.5 (H) 1.7 - 7.7 K/uL   Lymphocytes Relative 9 %   Lymphs Abs 1.3 0.7 - 4.0 K/uL   Monocytes Relative 6 %   Monocytes Absolute 0.9 0.1 - 1.0 K/uL   Eosinophils Relative 0 %   Eosinophils Absolute 0.0 0.0 - 0.5 K/uL   Basophils Relative 0 %   Basophils Absolute 0.1 0.0 - 0.1 K/uL   Immature Granulocytes 1 %   Abs Immature Granulocytes 0.08 (H) 0.00 - 0.07 K/uL  Hemoglobin A1c     Status: Abnormal   Collection Time: 02/10/24  3:38 AM  Result Value Ref Range   Hgb A1c MFr Bld 6.7 (H) 4.8 - 5.6 %   Mean Plasma Glucose 145.59 mg/dL  Glucose, capillary     Status: Abnormal   Collection Time: 02/10/24  9:38 AM  Result Value Ref Range   Glucose-Capillary 151 (H) 70 - 99 mg/dL   Medical history and chart was reviewed and case discussed with attending provider.  Assessment/Plan: 68 year old female status post fall down steps at home, resulting in  right midshaft clavicle fracture  Patient with stable injury to right clavicle.  We will plan  initial nonoperative management of fracture.  Recommend NWB RUE.  Continue sling immobilization.  Okay for wrist and elbow motion as tolerated.  As patient can tolerate, okay to start gentle pendulum exercises of the shoulder.  Will plan to have patient follow-up with Dr. Kendal 2 weeks after discharge for repeat x-rays of the right clavicle.  Will likely advance range of motion at that time.   Lauraine PATRIC Moores PA-C [facility] [phone] (office) [email or web address]

## 2024-02-10 NOTE — Progress Notes (Signed)
  Carryover admission to the Day Admitter.  I discussed this case with the EDP, Dr. Griselda.  Per these discussions:   This is a 68 year old female with history of hypertension, type 2 diabetes mellitus, who is being admitted to [facility] with multiple consecutive right-sided rib fractures, right clavicular fracture after mechanical fall down flight of stairs.  She is not on any blood thinners and there was no loss of consciousness, but she did hit her head as component of this fall.  CT head and cervical spine showed no evidence of acute process.  Additional imaging revealed multiple consecutive right-sided rib fractures without evidence of pneumothorax as well as right clavicle fracture.  Most recent oxygen saturation 98% on room air.  Right-sided shoulder immobilizer was placed.  EDP d/w on-call general surgery at [facility], Dr. Sheldon, who recommended TRH admission to [facility], where trauma surgery service will see pt in consult.   I have placed an order for observation to a med-tele bed at [facility].  I have placed some additional preliminary admit orders via the adult multi-morbid admission order set. I have also ordered prn IV Dilaudid , prn IV Zofran , incentive spirometry in addition to prn albuterol  nebulizer for shortness of breath.  Regarding her history of type 2 diabetes mellitus, I've added-on a hemoglobin A1c level and ordered before every meal and at bedtime CBG monitoring with associated low-dose sliding scale insulin .    Eva Pore, DO Hospitalist

## 2024-02-10 NOTE — Progress Notes (Signed)
 Transition of Care [facility]) - CAGE-AID Screening   Patient Details  Name: Patricia Delacruz MRN: [id number] Date of Birth: March 08, 1956  Transition of Care [facility]) CM/SW Contact:    Bernardino Mayotte, RN Phone Number: 02/10/2024, 10:15 PM   Clinical Narrative:  Patient endorsed occasional alcohol use, denies illicit drugs. Resources not given at this time.  CAGE-AID Screening:    Have You Ever Felt You Ought to Cut Down on Your Drinking or Drug Use?: No Have People Annoyed You By Critizing Your Drinking Or Drug Use?: No Have You Felt Bad Or Guilty About Your Drinking Or Drug Use?: No Have You Ever Had a Drink or Used Drugs First Thing In The Morning to Steady Your Nerves or to Get Rid of a Hangover?: No CAGE-AID Score: 0  Substance Abuse Education Offered: No

## 2024-02-10 NOTE — ED Notes (Signed)
 Report given. [facility] called.

## 2024-02-11 ENCOUNTER — Other Ambulatory Visit: Payer: Self-pay

## 2024-02-11 DIAGNOSIS — S2241XA Multiple fractures of ribs, right side, initial encounter for closed fracture: Secondary | ICD-10-CM | POA: Diagnosis not present

## 2024-02-11 LAB — GLUCOSE, CAPILLARY
Glucose-Capillary: 148 mg/dL — ABNORMAL HIGH (ref 70–99)
Glucose-Capillary: 105 mg/dL — ABNORMAL HIGH (ref 70–99)
Glucose-Capillary: 153 mg/dL — ABNORMAL HIGH (ref 70–99)
Glucose-Capillary: 125 mg/dL — ABNORMAL HIGH (ref 70–99)

## 2024-02-11 LAB — CBC WITH DIFFERENTIAL/PLATELET
Abs Immature Granulocytes: 0.05 K/uL (ref 0.00–0.07)
Basophils Absolute: 0.1 K/uL (ref 0.0–0.1)
Basophils Relative: 0 %
Eosinophils Absolute: 0.1 K/uL (ref 0.0–0.5)
Eosinophils Relative: 1 %
HCT: 37.2 % (ref 36.0–46.0)
Hemoglobin: 11.6 g/dL — ABNORMAL LOW (ref 12.0–15.0)
Immature Granulocytes: 0 %
Lymphocytes Relative: 17 %
Lymphs Abs: 2 K/uL (ref 0.7–4.0)
MCH: 26.4 pg (ref 26.0–34.0)
MCHC: 31.2 g/dL (ref 30.0–36.0)
MCV: 84.5 fL (ref 80.0–100.0)
Monocytes Absolute: 0.9 K/uL (ref 0.1–1.0)
Monocytes Relative: 8 %
Neutro Abs: 8.5 K/uL — ABNORMAL HIGH (ref 1.7–7.7)
Neutrophils Relative %: 74 %
Platelets: 264 K/uL (ref 150–400)
RBC: 4.4 MIL/uL (ref 3.87–5.11)
RDW: 16.2 % — ABNORMAL HIGH (ref 11.5–15.5)
WBC: 11.6 K/uL — ABNORMAL HIGH (ref 4.0–10.5)
nRBC: 0 % (ref 0.0–0.2)

## 2024-02-11 LAB — COMPREHENSIVE METABOLIC PANEL WITH GFR
ALT: 27 U/L (ref 0–44)
AST: 32 U/L (ref 15–41)
Albumin: 3.3 g/dL — ABNORMAL LOW (ref 3.5–5.0)
Alkaline Phosphatase: 74 U/L (ref 38–126)
Anion gap: 13 (ref 5–15)
BUN: 39 mg/dL — ABNORMAL HIGH (ref 8–23)
CO2: 24 mmol/L (ref 22–32)
Calcium: 9 mg/dL (ref 8.9–10.3)
Chloride: 96 mmol/L — ABNORMAL LOW (ref 98–111)
Creatinine, Ser: 2.15 mg/dL — ABNORMAL HIGH (ref 0.44–1.00)
GFR, Estimated: 25 mL/min — ABNORMAL LOW (ref >60)
Glucose, Bld: 152 mg/dL — ABNORMAL HIGH (ref 70–99)
Potassium: 3.9 mmol/L (ref 3.5–5.1)
Sodium: 133 mmol/L — ABNORMAL LOW (ref 135–145)
Total Bilirubin: 0.7 mg/dL (ref 0.0–1.2)
Total Protein: 6.3 g/dL — ABNORMAL LOW (ref 6.5–8.1)

## 2024-02-11 LAB — URINALYSIS, ROUTINE W REFLEX MICROSCOPIC
Bacteria, UA: NONE SEEN
Bilirubin Urine: NEGATIVE
Glucose, UA: 50 mg/dL — AB
Hgb urine dipstick: NEGATIVE
Ketones, ur: NEGATIVE mg/dL
Leukocytes,Ua: NEGATIVE
Nitrite: NEGATIVE
Protein, ur: NEGATIVE mg/dL
Specific Gravity, Urine: >1.046 — ABNORMAL HIGH (ref 1.005–1.030)
pH: 5 (ref 5.0–8.0)

## 2024-02-11 MED ORDER — LACTATED RINGERS IV BOLUS
1000.0000 mL | Freq: Once | INTRAVENOUS | Status: AC
  Administered 2024-02-11: 1000 mL via INTRAVENOUS

## 2024-02-11 MED ORDER — LACTATED RINGERS IV SOLN: INTRAVENOUS | Status: AC

## 2024-02-11 MED ORDER — POLYETHYLENE GLYCOL 3350 17 G PO PACK
17.0000 g | PACK | Freq: Two times a day (BID) | ORAL | Status: DC
  Administered 2024-02-11 – 2024-02-19 (×16): 17 g via ORAL
  Filled 2024-02-11 (×18): qty 1

## 2024-02-11 NOTE — Progress Notes (Signed)
 PROGRESS NOTE    Patricia Delacruz  FMW:[id number] DOB: 03/26/1956 DOA: 02/10/2024 PCP: Valma Carwin, MD  Chief Complaint  Patient presents with   Fall    Brief Narrative:   Patricia Delacruz is Patricia Delacruz 68 y.o. female with medical history significant of anxiety, depression, CIN-2, condyloma acuminata, depression, hyperlipidemia, hypertension, fibroid, type 2 diabetes, class II obesity who had Patricia Delacruz mechanical fall at home going down the stairs developing pain in her right posterior scalp, right shoulder and right chest wall.  Denied any prodromal symptoms. He denied fever, chills, rhinorrhea, sore throat, wheezing or hemoptysis.  No chest pain, palpitations, diaphoresis, PND, orthopnea or pitting edema of the lower extremities.  No abdominal pain, nausea, emesis, diarrhea, constipation, melena or hematochezia.  No flank pain, dysuria, frequency or hematuria.  No polyuria, polydipsia, polyphagia or blurred vision.    Lab work: CBC 0 white count of 14.9, hemoglobin 11.7 grams per deciliter and platelets 225.  CMP showed Patricia Delacruz glucose of albumin 3.0 g/dL, 768 and BUN of 25 mg/dL.  The rest of the CMP measurements were normal after calcium  was corrected.   Imaging: Right shoulder x-ray negative.  2 view chest radiograph with right 2nd through 5th rib fractures.  No effusion or pneumothorax.  There was vascular congestion.  CT head without contrast with no acute intercranial normality.  There was right posterior scalp hematoma.  No underlying calvarial [name].  CT cervical spine with no vertebral acute fracture.  Acute mildly displaced fracture of the posterior right first rib.  Partially visualized mildly displaced fracture of the right mid clavicle.  CT chest/abdomen/pelvis with contrast show acute fracture of the mid right clavicle.  There were acute fractures posterior right first and 3rd through 6th ribs.  Suspect nondisplaced fracture of the posterior right second rib.  No pneumothorax or pleural effusion.  No evidence for  acute traumatic injury in the abdomen or pelvis.  Small hiatal hernia.  Aortic atherosclerosis.  CT thoracic spine showing osteopenia and degenerative change.  Rib fractures as above.  There was cardiomegaly with CAD.  Chronic mild anterior wedging of the L1 vertebral body.  Multilevel DDD and joint changes with spinal stenosis L3-4, L4-5 and L5-S1.  Foraminal stenosis is greatest at L5-S1.   ED course: Initial vital signs were temperature 97.7 F, pulse 73, respiration 16, blood pressure 145/96 mmHg O2 sat 96% on room air.  Patient was given supplemental oxygen, fentanyl  50 mcg IVP x 1, morphine  4 mg IVP x 1 and ondansetron  4 mg IVP x 1.  I added ketorolac  15 mg IVP x 1.    Assessment & Plan:   Principal Problem:   Fracture of multiple ribs Active Problems:   Depression   Anxiety   Hyperlipidemia   Hypertension   Obesity (BMI 35.0-39.9 without comorbidity)   Clavicle fracture   Fall   Type 2 diabetes mellitus with hyperglycemia (HCC)   Grade I diastolic dysfunction  Right Mid Clavicle Fracture Right 1st Rib Fracture  Right 3rd-6th Rib Fracture.  Nondisplaced Fracture Posterior R 2nd Rib Mechanical Fall Appreciate ortho - Non op management of clavicle fracture - NWB to RUE, sling immobilization, ok for wrist and elbow motion as tolerated.  Ok to start gentle pendulum exercises of the shoulder.   Follow up with Dr. Kendal 2 weeks after discharge for repeat x rays. Appreciate surgery - awaiting CTA given first rib fracture - pain management, IS, pulm toilet  Orthostatic Hypotension C/o LH/nausea with standing  Bolus, repeat labs  Hold  ramipril    Hypertension Ramipril  on hold with above  HFpEF Apparently didn't tolerate beta blocker Holding ramipril  Echo with EF 60-65% - mild AS in May 2025  Depression  Anxiety Wellbutrin , cymbalta , ativan  prn, ambien  nightly T2DM  Pioglitazone   A1c 6.7   GERD PPI  Urinary Retention Continue bladder scan  Mobilize  Obesity Body  mass index is 36.07 kg/m.    DVT prophylaxis: SCD - per surgery, LMWH BID per trauma guidelines vs ASA in [facility] noted on CTA Code Status: full Family Communication: family at bedside Disposition:   Status is: Observation The patient remains OBS appropriate and will d/c before 2 midnights.   Consultants:  Surgery ortho  Procedures:  none  Antimicrobials:  Anti-infectives (From admission, onward)    None       Subjective: No new complaints  Objective: Vitals:   02/11/24 0544 02/11/24 0812 02/11/24 1238 02/11/24 1248  BP: 110/62 (!) 125/51 (!) 95/52 (!) 83/44  Pulse: 80 81 70   Resp: 20 14 16    Temp: 97.6 F (36.4 C) 97.8 F (36.6 C)    TempSrc: Oral Oral    SpO2: 95% 94% 90%   Weight: 107.6 kg       Intake/Output Summary (Last 24 hours) at 02/11/2024 1413 Last data filed at 02/11/2024 0220 Gross per 24 hour  Intake 420 ml  Output 470 ml  Net -50 ml   Filed Weights   02/11/24 0544  Weight: 107.6 kg    Examination:  General exam: Appears calm and comfortable  Respiratory system: unlabored Cardiovascular system: RRR Gastrointestinal system: Abdomen is nondistended, soft and nontender. Central nervous system: Alert and oriented. No focal neurological deficits. Extremities: trace LE edema, R arm sling     Data Reviewed: I have personally reviewed following labs and imaging studies  CBC: Recent Labs  Lab 02/10/24 0338  WBC 14.9*  NEUTROABS 12.5*  HGB 11.7*  HCT 38.9  MCV 85.7  PLT 225    Basic Metabolic Panel: Recent Labs  Lab 02/10/24 0338  NA 135  K 3.5  CL 100  CO2 25  GLUCOSE 231*  BUN 25*  CREATININE 0.82  CALCIUM  8.4*    GFR: Estimated Creatinine Clearance: 86.7 mL/min (by C-G formula based on SCr of 0.82 mg/dL).  Liver Function Tests: Recent Labs  Lab 02/10/24 0338  AST 19  ALT 20  ALKPHOS 66  BILITOT 0.3  PROT 6.7  ALBUMIN 3.0*    CBG: Recent Labs  Lab 02/10/24 1228 02/10/24 1643 02/10/24 2112  02/11/24 0809 02/11/24 1145  GLUCAP 154* 220* 129* 148* 105*     No results found for this or any previous visit (from the past 240 hours).       Radiology Studies: CT T-SPINE NO CHARGE Result Date: 02/10/2024 CLINICAL DATA:  Patricia Delacruz flight of stairs. Multiple known rib fractures. Back pain. EXAM: CT THORACIC AND LUMBAR SPINE WITHOUT CONTRAST TECHNIQUE: Multidetector CT imaging of the thoracic and lumbar spine was performed without intravenous contrast. Multiplanar CT image reconstructions were also generated. RADIATION DOSE REDUCTION: This exam was performed according to the departmental dose-optimization program which includes automated exposure control, adjustment of the mA and/or kV according to patient size and/or use of iterative reconstruction technique. COMPARISON:  PA Lat chest from today and 02/07/2012. CT abdomen pelvis and reconstructions 08/22/2018. FINDINGS: CT THORACIC SPINE FINDINGS Segmentation: There are 12 rib-bearing thoracic type vertebrae. Alignment: There is mild dextroscoliosis but no AP listhesis. Vertebrae: Osteopenia. No thoracic spine fractures or  focal pathologic process identified. There is thoracic spondylosis and multilevel endplate Schmorl's nodes beginning at T5, but also seen at T2-3 T3-4. Paraspinal and other soft tissues: No paraspinal mass, hematoma or fluid collections. Other soft tissue structures reported separately. Moderate hiatal hernia. Cardiomegaly with CAD. No pneumothorax. Disc levels: The thoracic discs are diffusely degenerated. The spinal canal is not optimally seen due to beam hardening related to body habitus. There is no obvious significant soft tissue or bony encroachment on the thecal sac such as due to herniated discs or spinal canal hematoma. The spinal canal appears widely patent as well as can be seen. There are slight facet spurs at lower thoracic levels but no levels demonstrate significant foraminal stenosis. Other: Midshaft right  clavicle acute fracture with overriding and mild stranding. Nondisplaced fracture posteromedial right first rib, anterior right second rib, with displaced fractures of the posterior right third through fifth ribs, posterolateral right sixth rib. CT LUMBAR SPINE FINDINGS Segmentation: Standard. Alignment: Trace chronic discogenic L2-3 retrolisthesis. Slight dextroscoliosis. Otherwise normal alignment. Vertebrae: Osteopenia without evidence of fractures. There is mild chronic anterior wedging of the L1 vertebral body, but no more than previously. There is moderate spondylosis, greatest at L1-2, L2-3, L5-S1. Paraspinal and other soft tissues: Reported separately. No acute findings. Disc levels: The discs are chronically collapsed, with vacuum phenomenon and endplate irregularities, at all lumbar levels except for Anyi Fels T12-L1 and L4-5, both of which have relatively normal disc heights. At L3-4, Jacarie Pate circumferential disc osteophyte complex and dorsal ligamentous hypertrophy cause moderate spinal canal stenosis. There is mild spinal stenosis at L4-5 due to Jarick Harkins disc bulge and slightly thickened ligaments. Moderate spinal stenosis is seen again at L5-S1 due to bidirectional osteophytes and Jsoeph Podesta calcified central disc extrusion. There is relatively mild facet joint hypertrophy. Along with the spondylosis this is associated with foraminal stenosis which is mild-to-moderate on the left at L2-3, bilaterally mild at L3-4, mild-to-moderate on the left at L4-5, and bilaterally moderate to severe at L5-S1. Both SI joints are patent with spurring and vacuum phenomenon. IMPRESSION: 1. Osteopenia and degenerative change without evidence of acute fractures of the thoracic or lumbar spine. 2. Multiple right rib fractures and right clavicle fracture with override. 3. Cardiomegaly with CAD. 4. Moderate hiatal hernia. 5. Chronic mild anterior wedging of the L1 vertebral body. 6. Multilevel degenerative disc and joint changes with spinal stenosis at  L3-4, L4-5 and L5-S1. 7. Foraminal stenosis greatest at L5-S1. Electronically Signed   By: Francis Quam M.D.   On: 02/10/2024 06:20   CT L-SPINE NO CHARGE Result Date: 02/10/2024 CLINICAL DATA:  Patricia down [facility] flight of stairs. Multiple known rib fractures. Back pain. EXAM: CT THORACIC AND LUMBAR SPINE WITHOUT CONTRAST TECHNIQUE: Multidetector CT imaging of the thoracic and lumbar spine was performed without intravenous contrast. Multiplanar CT image reconstructions were also generated. RADIATION DOSE REDUCTION: This exam was performed according to the departmental dose-optimization program which includes automated exposure control, adjustment of the mA and/or kV according to patient size and/or use of iterative reconstruction technique. COMPARISON:  PA Lat chest from today and 02/07/2012. CT abdomen pelvis and reconstructions 08/22/2018. FINDINGS: CT THORACIC SPINE FINDINGS Segmentation: There are 12 rib-bearing thoracic type vertebrae. Alignment: There is mild dextroscoliosis but no AP listhesis. Vertebrae: Osteopenia. No thoracic spine fractures or focal pathologic process identified. There is thoracic spondylosis and multilevel endplate Schmorl's nodes beginning at T5, but also seen at T2-3 T3-4. Paraspinal and other soft tissues: No paraspinal mass, hematoma or fluid collections. Other  soft tissue structures reported separately. Moderate hiatal hernia. Cardiomegaly with CAD. No pneumothorax. Disc levels: The thoracic discs are diffusely degenerated. The spinal canal is not optimally seen due to beam hardening related to body habitus. There is no obvious significant soft tissue or bony encroachment on the thecal sac such as due to herniated discs or spinal canal hematoma. The spinal canal appears widely patent as well as can be seen. There are slight facet spurs at lower thoracic levels but no levels demonstrate significant foraminal stenosis. Other: Midshaft right clavicle acute fracture with overriding and  mild stranding. Nondisplaced fracture posteromedial right first rib, anterior right second rib, with displaced fractures of the posterior right third through fifth ribs, posterolateral right sixth rib. CT LUMBAR SPINE FINDINGS Segmentation: Standard. Alignment: Trace chronic discogenic L2-3 retrolisthesis. Slight dextroscoliosis. Otherwise normal alignment. Vertebrae: Osteopenia without evidence of fractures. There is mild chronic anterior wedging of the L1 vertebral body, but no more than previously. There is moderate spondylosis, greatest at L1-2, L2-3, L5-S1. Paraspinal and other soft tissues: Reported separately. No acute findings. Disc levels: The discs are chronically collapsed, with vacuum phenomenon and endplate irregularities, at all lumbar levels except for Noriko Macari T12-L1 and L4-5, both of which have relatively normal disc heights. At L3-4, Fairley Copher circumferential disc osteophyte complex and dorsal ligamentous hypertrophy cause moderate spinal canal stenosis. There is mild spinal stenosis at L4-5 due to Zakya Halabi disc bulge and slightly thickened ligaments. Moderate spinal stenosis is seen again at L5-S1 due to bidirectional osteophytes and Dietrick Barris calcified central disc extrusion. There is relatively mild facet joint hypertrophy. Along with the spondylosis this is associated with foraminal stenosis which is mild-to-moderate on the left at L2-3, bilaterally mild at L3-4, mild-to-moderate on the left at L4-5, and bilaterally moderate to severe at L5-S1. Both SI joints are patent with spurring and vacuum phenomenon. IMPRESSION: 1. Osteopenia and degenerative change without evidence of acute fractures of the thoracic or lumbar spine. 2. Multiple right rib fractures and right clavicle fracture with override. 3. Cardiomegaly with CAD. 4. Moderate hiatal hernia. 5. Chronic mild anterior wedging of the L1 vertebral body. 6. Multilevel degenerative disc and joint changes with spinal stenosis at L3-4, L4-5 and L5-S1. 7. Foraminal stenosis  greatest at L5-S1. Electronically Signed   By: Francis Quam M.D.   On: 02/10/2024 06:20   CT CHEST ABDOMEN PELVIS W CONTRAST Result Date: 02/10/2024 CLINICAL DATA:  Patient fell down Gudrun Axe flight of stairs. Pain in multiple areas. Right rib fractures on chest x-ray. EXAM: CT CHEST, ABDOMEN, AND PELVIS WITH CONTRAST TECHNIQUE: Multidetector CT imaging of the chest, abdomen and pelvis was performed following the standard protocol during bolus administration of intravenous contrast. RADIATION DOSE REDUCTION: This exam was performed according to the departmental dose-optimization program which includes automated exposure control, adjustment of the mA and/or kV according to patient size and/or use of iterative reconstruction technique. CONTRAST:  OMNIPAQUE  IOHEXOL  300 MG/ML  SOLN COMPARISON:  CT stone study 03/29/2018 FINDINGS: CT CHEST FINDINGS Cardiovascular: Heart size upper normal. No substantial pericardial effusion. Mild atherosclerotic calcification is noted in the wall of the thoracic aorta. Mediastinum/Nodes: No mediastinal edema or hemorrhage. No mediastinal lymphadenopathy. There is no hilar lymphadenopathy. Small hiatal hernia. The esophagus has normal imaging features. There is no axillary lymphadenopathy. Lungs/Pleura: No evidence for pneumothorax. No substantial pleural effusion. Dependent atelectasis noted in both lower lungs. No focal airspace consolidation. No suspicious pulmonary nodule or mass. Musculoskeletal: Acute fracture noted mid right clavicle. Acute fractures identified in the posterior  right first rib. Suspect nondisplaced fracture posterior right second rib. Acute fractures identified posterior right third through sixth ribs. No evidence for left-sided rib fracture. No sternal fracture. No obvious thoracic spine fracture but see thoracic spine CT report for definitive thoracic spine characterization, dictated separately. CT ABDOMEN PELVIS FINDINGS Hepatobiliary: No suspicious focal  abnormality within the liver parenchyma. Gallbladder is surgically absent. No intrahepatic or extrahepatic biliary dilation. Pancreas: No focal mass lesion. No dilatation of the main duct. No intraparenchymal cyst. No peripancreatic edema. Spleen: No splenomegaly. No suspicious focal mass lesion. Adrenals/Urinary Tract: No adrenal nodule or mass. Central sinus cysts are noted in the kidneys bilaterally. No followup imaging is recommended. Kidneys otherwise unremarkable. No evidence for hydroureter. The urinary bladder appears normal for the degree of distention. Stomach/Bowel: Small hiatal hernia. Stomach otherwise unremarkable. Duodenum is normally positioned as is the ligament of Treitz. No small bowel wall thickening. No small bowel dilatation. The terminal ileum is normal. The appendix is normal. No gross colonic mass. No colonic wall thickening. Vascular/Lymphatic: There is mild atherosclerotic calcification of the abdominal aorta without aneurysm. There is no gastrohepatic or hepatoduodenal ligament lymphadenopathy. No retroperitoneal or mesenteric lymphadenopathy. No pelvic sidewall lymphadenopathy. Reproductive: The uterus is unremarkable.  There is no adnexal mass. Other: No intraperitoneal free fluid. Musculoskeletal: No evidence for lumbar spine fracture, but please see report for dedicated lumbar spine CT dictated separately. No evidence for an acute fracture in the bony anatomy of the pelvis. Small umbilical hernia contains only fat. IMPRESSION: 1. Acute fracture mid right clavicle. 2. Acute fractures posterior right first and third through sixth ribs. Suspect nondisplaced fracture posterior right second rib. No evidence for pneumothorax or pleural effusion. 3. No evidence for acute traumatic injury in the abdomen or pelvis. No intraperitoneal free fluid. 4. Small hiatal hernia. 5.  Aortic Atherosclerosis (ICD10-I70.0). Electronically Signed   By: Camellia Candle M.D.   On: 02/10/2024 06:00   CT Head Wo  Contrast Result Date: 02/10/2024 CLINICAL DATA:  Head trauma, fell down Rhylynn Perdomo flight of steps. EXAM: CT HEAD WITHOUT CONTRAST CT CERVICAL SPINE WITHOUT CONTRAST TECHNIQUE: Multidetector CT imaging of the head and cervical spine was performed following the standard protocol without intravenous contrast. Multiplanar CT image reconstructions of the cervical spine were also generated. RADIATION DOSE REDUCTION: This exam was performed according to the departmental dose-optimization program which includes automated exposure control, adjustment of the mA and/or kV according to patient size and/or use of iterative reconstruction technique. COMPARISON:  None Available. FINDINGS: CT HEAD FINDINGS Brain: No intracranial hemorrhage, mass effect, or evidence of acute infarct. No hydrocephalus. No extra-axial fluid collection. Vascular: No hyperdense vessel or unexpected calcification. Skull: No fracture or focal lesion.  Right posterior scalp hematoma. Sinuses/Orbits: No acute finding. Paranasal sinuses and mastoid air cells are well aerated. Other: None. CT CERVICAL SPINE FINDINGS Alignment: No evidence of traumatic malalignment. Skull base and vertebrae: No acute fracture. No primary bone lesion or focal pathologic process. Soft tissues and spinal canal: No prevertebral fluid or swelling. No visible canal hematoma. Disc levels: Multilevel spondylosis with osteophytic spurring, disc space height loss, and degenerative endplate changes greatest at C4 through C7 where it is moderate to advanced. Multilevel posterior disc osteophyte complexes cause mild effacement of the ventral thecal sac. No severe spinal canal narrowing. Upper chest: Acute mildly displaced fracture of the posterior right first rib. Partially visualized mildly displaced fracture of the right midclavicle. Other: None. IMPRESSION: 1. No acute intracranial abnormality. 2. Right posterior scalp hematoma. No  underlying calvarial fracture. 3. No acute fracture in the  cervical spine. 4. Acute mildly displaced fracture of the posterior right first rib. 5. Partially visualized mildly displaced fracture of the right mid clavicle. Electronically Signed   By: Norman Gatlin M.D.   On: 02/10/2024 03:34   CT Cervical Spine Wo Contrast Result Date: 02/10/2024 CLINICAL DATA:  Head trauma, fell down Gerell Fortson flight of steps. EXAM: CT HEAD WITHOUT CONTRAST CT CERVICAL SPINE WITHOUT CONTRAST TECHNIQUE: Multidetector CT imaging of the head and cervical spine was performed following the standard protocol without intravenous contrast. Multiplanar CT image reconstructions of the cervical spine were also generated. RADIATION DOSE REDUCTION: This exam was performed according to the departmental dose-optimization program which includes automated exposure control, adjustment of the mA and/or kV according to patient size and/or use of iterative reconstruction technique. COMPARISON:  None Available. FINDINGS: CT HEAD FINDINGS Brain: No intracranial hemorrhage, mass effect, or evidence of acute infarct. No hydrocephalus. No extra-axial fluid collection. Vascular: No hyperdense vessel or unexpected calcification. Skull: No fracture or focal lesion.  Right posterior scalp hematoma. Sinuses/Orbits: No acute finding. Paranasal sinuses and mastoid air cells are well aerated. Other: None. CT CERVICAL SPINE FINDINGS Alignment: No evidence of traumatic malalignment. Skull base and vertebrae: No acute fracture. No primary bone lesion or focal pathologic process. Soft tissues and spinal canal: No prevertebral fluid or swelling. No visible canal hematoma. Disc levels: Multilevel spondylosis with osteophytic spurring, disc space height loss, and degenerative endplate changes greatest at C4 through C7 where it is moderate to advanced. Multilevel posterior disc osteophyte complexes cause mild effacement of the ventral thecal sac. No severe spinal canal narrowing. Upper chest: Acute mildly displaced fracture of the  posterior right first rib. Partially visualized mildly displaced fracture of the right midclavicle. Other: None. IMPRESSION: 1. No acute intracranial abnormality. 2. Right posterior scalp hematoma. No underlying calvarial fracture. 3. No acute fracture in the cervical spine. 4. Acute mildly displaced fracture of the posterior right first rib. 5. Partially visualized mildly displaced fracture of the right mid clavicle. Electronically Signed   By: Norman Gatlin M.D.   On: 02/10/2024 03:34   DG Shoulder Right Addendum Date: 02/10/2024 ADDENDUM REPORT: 02/10/2024 03:18 ADDENDUM: After comparing with today's chest x-ray, multiple right rib fractures are noted involving the 2nd through 5th ribs. Electronically Signed   By: Franky Crease M.D.   On: 02/10/2024 03:18   Result Date: 02/10/2024 CLINICAL DATA:  Fall, posterior shoulder pain EXAM: RIGHT SHOULDER - 2+ VIEW COMPARISON:  None Available. FINDINGS: There is no evidence of fracture or dislocation. There is no evidence of arthropathy or other focal bone abnormality. Soft tissues are unremarkable. IMPRESSION: Negative. Electronically Signed: By: Franky Crease M.D. On: 02/10/2024 03:15   DG Chest 2 View Result Date: 02/10/2024 CLINICAL DATA:  Fall, right shoulder pain, chest pain EXAM: CHEST - 2 VIEW COMPARISON:  02/07/2012 FINDINGS: Mild elevation of the right hemidiaphragm. Heart is borderline in size. Mild vascular congestion. No confluent opacities, effusions or edema. Multiple right rib fractures involving the 2nd through 5th ribs. No pneumothorax. IMPRESSION: Right 2nd through 5th rib fractures. No visible effusion or pneumothorax. Vascular congestion. Electronically Signed   By: Franky Crease M.D.   On: 02/10/2024 03:17        Scheduled Meds:  acetaminophen   1,000 mg Oral Q6H   buPROPion   450 mg Oral Daily   DULoxetine   30 mg Oral Daily   insulin  aspart  0-9 Units Subcutaneous TID WC  lidocaine   2 patch Transdermal Q24H   methocarbamol   500 mg  Oral TID   pantoprazole   40 mg Oral Daily   pioglitazone   30 mg Oral Daily   ramipril   10 mg Oral Daily   Continuous Infusions:   LOS: 0 days    Time spent: over 30 min     Meliton Monte, MD [facility]   To contact the attending provider between 7A-7P or the covering provider during after hours 7P-7A, please log into the web site [email or web address] and access using universal [place] password for that web site. If you do not have the password, please call the hospital operator.  02/11/2024, 2:13 PM

## 2024-02-11 NOTE — Care Management Obs Status (Signed)
 MEDICARE OBSERVATION STATUS NOTIFICATION   Patient Details  Name: Patricia Delacruz MRN: [id number] Date of Birth: 1955-10-13   Medicare Observation Status Notification Given:  Yes    Jon Cruel 02/11/2024, 10:33 AM

## 2024-02-11 NOTE — Progress Notes (Signed)
 Physical Therapy Treatment Patient Details Name: Patricia Delacruz MRN: [id number] DOB: 06/29/1955 Today's Date: 02/11/2024   History of Present Illness Patient is a 68 y/o female admitted 02/09/24 with fall down stairs at home noted R clavicle fx, R first rib, and 3-6 rib fx.  PMH positive for DM, CIN II (cervical intraepithelial neoplasia), h/o cervical/vulvar cancer, HTN, HLD and obesity.    PT Comments  Pt supine in bed on arrival.  She appears sleepy but agreeable to session.  Pt performed bed to chair and back to bed.  IN chair BP obtained and pt presents with BP of 77/53 in reclined position.  Pt performed back to bed transfer this session and BP obtained in supine and increased to 99/58.  Informed nursing of pt presentation this session.  Plan for full set of orthostatic vitals next session and to progress to stair training as able.      If plan is discharge home, recommend the following: A little help with walking and/or transfers;A little help with bathing/dressing/bathroom;Help with stairs or ramp for entrance   Can travel by private vehicle        Equipment Recommendations  None recommended by PT    Recommendations for Other Services       Precautions / Restrictions Precautions Precautions: Fall Required Braces or Orthoses: Sling Restrictions Weight Bearing Restrictions Per Provider Order: Yes RUE Weight Bearing Per Provider Order: Non weight bearing Other Position/Activity Restrictions: PA from ortho approved use of rollator for balance if tolerated without pushing weight on R hand     Mobility  Bed Mobility Overal bed mobility: Needs Assistance Bed Mobility: Supine to Sit, Sit to Supine     Supine to sit: Min assist Sit to supine: Mod assist   General bed mobility comments: Pt reports feeling very sleepy, focused on moving to edge of bed where she reports nausea but denies dizziness. Pt required assistance with trunk elevation to edge of bed and lift of B LEs to return  back to bed.    Transfers Overall transfer level: Needs assistance Equipment used: None Transfers: Sit to/from Stand Sit to Stand: Min assist           General transfer comment: up to stand no device but steadying help, offerred L HHA though she did not take it, performed from bed to recliner and back to bed.    Ambulation/Gait Ambulation/Gait assistance:  (Limited to short steps from bed to recliner and back to bed due to hypotension.)                 Stairs             Wheelchair Mobility     Tilt Bed    Modified Rankin (Stroke Patients Only)       Balance     Sitting balance-Leahy Scale: Good       Standing balance-Leahy Scale: Fair                              Hotel manager: No apparent difficulties  Cognition Arousal: Alert Behavior During Therapy: WFL for tasks assessed/performed   PT - Cognitive impairments: No apparent impairments                         Following commands: Intact      Cueing Cueing Techniques: Verbal cues  Exercises      General Comments  Pertinent Vitals/Pain Pain Assessment Pain Assessment: Faces Faces Pain Scale: Hurts a little bit Pain Location: R shoulder, and nausea Pain Descriptors / Indicators: Aching Pain Intervention(s): Monitored during session, Repositioned, Ice applied    Home Living                          Prior Function            PT Goals (current goals can now be found in the care plan section) Acute Rehab PT Goals Patient Stated Goal: To lay down and rest PT Goal Formulation: With patient Potential to Achieve Goals: Good Progress towards PT goals: Progressing toward goals    Frequency    Min 3X/week      PT Plan      Co-evaluation              AM-PAC PT 6 Clicks Mobility   Outcome Measure  Help needed turning from your back to your side while in a flat bed without using bedrails?: A  Lot Help needed moving from lying on your back to sitting on the side of a flat bed without using bedrails?: A Lot Help needed moving to and from a bed to a chair (including a wheelchair)?: A Little Help needed standing up from a chair using your arms (e.g., wheelchair or bedside chair)?: A Little Help needed to walk in hospital room?: A Little Help needed climbing 3-5 steps with a railing? : A Little 6 Click Score: 16    End of Session   Activity Tolerance: Patient limited by pain;Other (comment) (nausea, grogginess, hypotension) Patient left: in bed;with call bell/phone within reach;with bed alarm set Nurse Communication: Mobility status;Other (comment) (BP, IV found in bed, nausea) PT Visit Diagnosis: Other abnormalities of gait and mobility (R26.89);History of falling (Z91.81);Pain Pain - Right/Left: Right Pain - part of body: Shoulder     Time: 1350-1436 PT Time Calculation (min) (ACUTE ONLY): 46 min  Charges:    $Therapeutic Activity: 38-52 mins PT General Charges $$ ACUTE PT VISIT: 1 Visit                     [name] , PTA Acute Rehabilitation Services Office [phone]    [name] [name] 02/11/2024, 2:40 PM

## 2024-02-11 NOTE — Progress Notes (Signed)
 Pt complains she having difficulty starting stream twice.  Bladder scanner showed 249 ml urine. On call provider notified and ordered in and out cath and urinalysis. 470 cc amber clear urine output was noted. Urine sample was sent to laboratory. No other complaints. Will continue to monitor.

## 2024-02-11 NOTE — Progress Notes (Signed)
 Subjective: CC: R clavicle and R rib pain. Some back pain. No n/t/w. No other complaints. Pain controlled. On RA. No sob. Using IS. Tolerating po. No currently n/v. Passing flatus. No BM. Denies abdominal pain. Worked with PT/OT yesterday. Required I/O overnight.   Objective: Vital signs in last 24 hours: Temp:  [97.4 F (36.3 C)-98 F (36.7 C)] 97.6 F (36.4 C) (08/23 0544) Pulse Rate:  [80-83] 80 (08/23 0544) Resp:  [16-20] 20 (08/23 0544) BP: (97-143)/(53-68) 110/62 (08/23 0544) SpO2:  [91 %-97 %] 95 % (08/23 0544) Weight:  [107.6 kg] 107.6 kg (08/23 0544) Last BM Date : 02/09/24  Intake/Output from previous day: 08/22 0701 - 08/23 0700 In: 790 [P.O.:790] Out: 470 [Urine:470] Intake/Output this shift: No intake/output data recorded.  PE: Gen:  Alert, NAD, pleasant HEENT: EOM's intact, pupils equal and round Card:  RRR. Radial and DP 2+ b/l. Pulm:  CTAB, no W/R/R, effort normal. Pulling 1000-1250 on IS Abd: Soft, ND, NT Ext:  RUE in sling, no wrist drop, radial 2+. No ttp and able rom of the major joints for LUE and BLE's.  Psych: A&Ox3  Neuro: CN 3-12 intact. F/c. MAE's. SILT to BUE and BLE's. Non-focal Skin: R knee abrasion. Otherwise warm and dry  Lab Results:  Recent Labs    02/10/24 0338  WBC 14.9*  HGB 11.7*  HCT 38.9  PLT 225   BMET Recent Labs    02/10/24 0338  NA 135  K 3.5  CL 100  CO2 25  GLUCOSE 231*  BUN 25*  CREATININE 0.82  CALCIUM  8.4*   PT/INR No results for input(s): LABPROT, INR in the last 72 hours. CMP     Component Value Date/Time   NA 135 02/10/2024 0338   NA 139 09/29/2023 1207   K 3.5 02/10/2024 0338   CL 100 02/10/2024 0338   CO2 25 02/10/2024 0338   GLUCOSE 231 (H) 02/10/2024 0338   BUN 25 (H) 02/10/2024 0338   BUN 19 09/29/2023 1207   CREATININE 0.82 02/10/2024 0338   CALCIUM  8.4 (L) 02/10/2024 0338   PROT 6.7 02/10/2024 0338   ALBUMIN 3.0 (L) 02/10/2024 0338   AST 19 02/10/2024 0338   ALT 20  02/10/2024 0338   ALKPHOS 66 02/10/2024 0338   BILITOT 0.3 02/10/2024 0338   GFRNONAA >60 02/10/2024 0338   GFRAA >60 12/10/2017 2232   Lipase     Component Value Date/Time   LIPASE 40 12/10/2017 2232    Studies/Results: CT T-SPINE NO CHARGE Result Date: 02/10/2024 CLINICAL DATA:  Clemens down a flight of stairs. Multiple known rib fractures. Back pain. EXAM: CT THORACIC AND LUMBAR SPINE WITHOUT CONTRAST TECHNIQUE: Multidetector CT imaging of the thoracic and lumbar spine was performed without intravenous contrast. Multiplanar CT image reconstructions were also generated. RADIATION DOSE REDUCTION: This exam was performed according to the departmental dose-optimization program which includes automated exposure control, adjustment of the mA and/or kV according to patient size and/or use of iterative reconstruction technique. COMPARISON:  PA Lat chest from today and 02/07/2012. CT abdomen pelvis and reconstructions 08/22/2018. FINDINGS: CT THORACIC SPINE FINDINGS Segmentation: There are 12 rib-bearing thoracic type vertebrae. Alignment: There is mild dextroscoliosis but no AP listhesis. Vertebrae: Osteopenia. No thoracic spine fractures or focal pathologic process identified. There is thoracic spondylosis and multilevel endplate Schmorl's nodes beginning at T5, but also seen at T2-3 T3-4. Paraspinal and other soft tissues: No paraspinal mass, hematoma or fluid collections. Other soft tissue structures reported  separately. Moderate hiatal hernia. Cardiomegaly with CAD. No pneumothorax. Disc levels: The thoracic discs are diffusely degenerated. The spinal canal is not optimally seen due to beam hardening related to body habitus. There is no obvious significant soft tissue or bony encroachment on the thecal sac such as due to herniated discs or spinal canal hematoma. The spinal canal appears widely patent as well as can be seen. There are slight facet spurs at lower thoracic levels but no levels demonstrate  significant foraminal stenosis. Other: Midshaft right clavicle acute fracture with overriding and mild stranding. Nondisplaced fracture posteromedial right first rib, anterior right second rib, with displaced fractures of the posterior right third through fifth ribs, posterolateral right sixth rib. CT LUMBAR SPINE FINDINGS Segmentation: Standard. Alignment: Trace chronic discogenic L2-3 retrolisthesis. Slight dextroscoliosis. Otherwise normal alignment. Vertebrae: Osteopenia without evidence of fractures. There is mild chronic anterior wedging of the L1 vertebral body, but no more than previously. There is moderate spondylosis, greatest at L1-2, L2-3, L5-S1. Paraspinal and other soft tissues: Reported separately. No acute findings. Disc levels: The discs are chronically collapsed, with vacuum phenomenon and endplate irregularities, at all lumbar levels except for a T12-L1 and L4-5, both of which have relatively normal disc heights. At L3-4, a circumferential disc osteophyte complex and dorsal ligamentous hypertrophy cause moderate spinal canal stenosis. There is mild spinal stenosis at L4-5 due to a disc bulge and slightly thickened ligaments. Moderate spinal stenosis is seen again at L5-S1 due to bidirectional osteophytes and a calcified central disc extrusion. There is relatively mild facet joint hypertrophy. Along with the spondylosis this is associated with foraminal stenosis which is mild-to-moderate on the left at L2-3, bilaterally mild at L3-4, mild-to-moderate on the left at L4-5, and bilaterally moderate to severe at L5-S1. Both SI joints are patent with spurring and vacuum phenomenon. IMPRESSION: 1. Osteopenia and degenerative change without evidence of acute fractures of the thoracic or lumbar spine. 2. Multiple right rib fractures and right clavicle fracture with override. 3. Cardiomegaly with CAD. 4. Moderate hiatal hernia. 5. Chronic mild anterior wedging of the L1 vertebral body. 6. Multilevel  degenerative disc and joint changes with spinal stenosis at L3-4, L4-5 and L5-S1. 7. Foraminal stenosis greatest at L5-S1. Electronically Signed   By: Francis Quam M.D.   On: 02/10/2024 06:20   CT L-SPINE NO CHARGE Result Date: 02/10/2024 CLINICAL DATA:  Clemens down a flight of stairs. Multiple known rib fractures. Back pain. EXAM: CT THORACIC AND LUMBAR SPINE WITHOUT CONTRAST TECHNIQUE: Multidetector CT imaging of the thoracic and lumbar spine was performed without intravenous contrast. Multiplanar CT image reconstructions were also generated. RADIATION DOSE REDUCTION: This exam was performed according to the departmental dose-optimization program which includes automated exposure control, adjustment of the mA and/or kV according to patient size and/or use of iterative reconstruction technique. COMPARISON:  PA Lat chest from today and 02/07/2012. CT abdomen pelvis and reconstructions 08/22/2018. FINDINGS: CT THORACIC SPINE FINDINGS Segmentation: There are 12 rib-bearing thoracic type vertebrae. Alignment: There is mild dextroscoliosis but no AP listhesis. Vertebrae: Osteopenia. No thoracic spine fractures or focal pathologic process identified. There is thoracic spondylosis and multilevel endplate Schmorl's nodes beginning at T5, but also seen at T2-3 T3-4. Paraspinal and other soft tissues: No paraspinal mass, hematoma or fluid collections. Other soft tissue structures reported separately. Moderate hiatal hernia. Cardiomegaly with CAD. No pneumothorax. Disc levels: The thoracic discs are diffusely degenerated. The spinal canal is not optimally seen due to beam hardening related to body habitus. There is no  obvious significant soft tissue or bony encroachment on the thecal sac such as due to herniated discs or spinal canal hematoma. The spinal canal appears widely patent as well as can be seen. There are slight facet spurs at lower thoracic levels but no levels demonstrate significant foraminal stenosis. Other:  Midshaft right clavicle acute fracture with overriding and mild stranding. Nondisplaced fracture posteromedial right first rib, anterior right second rib, with displaced fractures of the posterior right third through fifth ribs, posterolateral right sixth rib. CT LUMBAR SPINE FINDINGS Segmentation: Standard. Alignment: Trace chronic discogenic L2-3 retrolisthesis. Slight dextroscoliosis. Otherwise normal alignment. Vertebrae: Osteopenia without evidence of fractures. There is mild chronic anterior wedging of the L1 vertebral body, but no more than previously. There is moderate spondylosis, greatest at L1-2, L2-3, L5-S1. Paraspinal and other soft tissues: Reported separately. No acute findings. Disc levels: The discs are chronically collapsed, with vacuum phenomenon and endplate irregularities, at all lumbar levels except for a T12-L1 and L4-5, both of which have relatively normal disc heights. At L3-4, a circumferential disc osteophyte complex and dorsal ligamentous hypertrophy cause moderate spinal canal stenosis. There is mild spinal stenosis at L4-5 due to a disc bulge and slightly thickened ligaments. Moderate spinal stenosis is seen again at L5-S1 due to bidirectional osteophytes and a calcified central disc extrusion. There is relatively mild facet joint hypertrophy. Along with the spondylosis this is associated with foraminal stenosis which is mild-to-moderate on the left at L2-3, bilaterally mild at L3-4, mild-to-moderate on the left at L4-5, and bilaterally moderate to severe at L5-S1. Both SI joints are patent with spurring and vacuum phenomenon. IMPRESSION: 1. Osteopenia and degenerative change without evidence of acute fractures of the thoracic or lumbar spine. 2. Multiple right rib fractures and right clavicle fracture with override. 3. Cardiomegaly with CAD. 4. Moderate hiatal hernia. 5. Chronic mild anterior wedging of the L1 vertebral body. 6. Multilevel degenerative disc and joint changes with  spinal stenosis at L3-4, L4-5 and L5-S1. 7. Foraminal stenosis greatest at L5-S1. Electronically Signed   By: Francis Quam M.D.   On: 02/10/2024 06:20   CT CHEST ABDOMEN PELVIS W CONTRAST Result Date: 02/10/2024 CLINICAL DATA:  Patient fell down a flight of stairs. Pain in multiple areas. Right rib fractures on chest x-ray. EXAM: CT CHEST, ABDOMEN, AND PELVIS WITH CONTRAST TECHNIQUE: Multidetector CT imaging of the chest, abdomen and pelvis was performed following the standard protocol during bolus administration of intravenous contrast. RADIATION DOSE REDUCTION: This exam was performed according to the departmental dose-optimization program which includes automated exposure control, adjustment of the mA and/or kV according to patient size and/or use of iterative reconstruction technique. CONTRAST:  OMNIPAQUE  IOHEXOL  300 MG/ML  SOLN COMPARISON:  CT stone study 03/29/2018 FINDINGS: CT CHEST FINDINGS Cardiovascular: Heart size upper normal. No substantial pericardial effusion. Mild atherosclerotic calcification is noted in the wall of the thoracic aorta. Mediastinum/Nodes: No mediastinal edema or hemorrhage. No mediastinal lymphadenopathy. There is no hilar lymphadenopathy. Small hiatal hernia. The esophagus has normal imaging features. There is no axillary lymphadenopathy. Lungs/Pleura: No evidence for pneumothorax. No substantial pleural effusion. Dependent atelectasis noted in both lower lungs. No focal airspace consolidation. No suspicious pulmonary nodule or mass. Musculoskeletal: Acute fracture noted mid right clavicle. Acute fractures identified in the posterior right first rib. Suspect nondisplaced fracture posterior right second rib. Acute fractures identified posterior right third through sixth ribs. No evidence for left-sided rib fracture. No sternal fracture. No obvious thoracic spine fracture but see thoracic spine CT report  for definitive thoracic spine characterization, dictated separately.  CT ABDOMEN PELVIS FINDINGS Hepatobiliary: No suspicious focal abnormality within the liver parenchyma. Gallbladder is surgically absent. No intrahepatic or extrahepatic biliary dilation. Pancreas: No focal mass lesion. No dilatation of the main duct. No intraparenchymal cyst. No peripancreatic edema. Spleen: No splenomegaly. No suspicious focal mass lesion. Adrenals/Urinary Tract: No adrenal nodule or mass. Central sinus cysts are noted in the kidneys bilaterally. No followup imaging is recommended. Kidneys otherwise unremarkable. No evidence for hydroureter. The urinary bladder appears normal for the degree of distention. Stomach/Bowel: Small hiatal hernia. Stomach otherwise unremarkable. Duodenum is normally positioned as is the ligament of Treitz. No small bowel wall thickening. No small bowel dilatation. The terminal ileum is normal. The appendix is normal. No gross colonic mass. No colonic wall thickening. Vascular/Lymphatic: There is mild atherosclerotic calcification of the abdominal aorta without aneurysm. There is no gastrohepatic or hepatoduodenal ligament lymphadenopathy. No retroperitoneal or mesenteric lymphadenopathy. No pelvic sidewall lymphadenopathy. Reproductive: The uterus is unremarkable.  There is no adnexal mass. Other: No intraperitoneal free fluid. Musculoskeletal: No evidence for lumbar spine fracture, but please see report for dedicated lumbar spine CT dictated separately. No evidence for an acute fracture in the bony anatomy of the pelvis. Small umbilical hernia contains only fat. IMPRESSION: 1. Acute fracture mid right clavicle. 2. Acute fractures posterior right first and third through sixth ribs. Suspect nondisplaced fracture posterior right second rib. No evidence for pneumothorax or pleural effusion. 3. No evidence for acute traumatic injury in the abdomen or pelvis. No intraperitoneal free fluid. 4. Small hiatal hernia. 5.  Aortic Atherosclerosis (ICD10-I70.0). Electronically Signed    By: Camellia Candle M.D.   On: 02/10/2024 06:00   CT Head Wo Contrast Result Date: 02/10/2024 CLINICAL DATA:  Head trauma, fell down a flight of steps. EXAM: CT HEAD WITHOUT CONTRAST CT CERVICAL SPINE WITHOUT CONTRAST TECHNIQUE: Multidetector CT imaging of the head and cervical spine was performed following the standard protocol without intravenous contrast. Multiplanar CT image reconstructions of the cervical spine were also generated. RADIATION DOSE REDUCTION: This exam was performed according to the departmental dose-optimization program which includes automated exposure control, adjustment of the mA and/or kV according to patient size and/or use of iterative reconstruction technique. COMPARISON:  None Available. FINDINGS: CT HEAD FINDINGS Brain: No intracranial hemorrhage, mass effect, or evidence of acute infarct. No hydrocephalus. No extra-axial fluid collection. Vascular: No hyperdense vessel or unexpected calcification. Skull: No fracture or focal lesion.  Right posterior scalp hematoma. Sinuses/Orbits: No acute finding. Paranasal sinuses and mastoid air cells are well aerated. Other: None. CT CERVICAL SPINE FINDINGS Alignment: No evidence of traumatic malalignment. Skull base and vertebrae: No acute fracture. No primary bone lesion or focal pathologic process. Soft tissues and spinal canal: No prevertebral fluid or swelling. No visible canal hematoma. Disc levels: Multilevel spondylosis with osteophytic spurring, disc space height loss, and degenerative endplate changes greatest at C4 through C7 where it is moderate to advanced. Multilevel posterior disc osteophyte complexes cause mild effacement of the ventral thecal sac. No severe spinal canal narrowing. Upper chest: Acute mildly displaced fracture of the posterior right first rib. Partially visualized mildly displaced fracture of the right midclavicle. Other: None. IMPRESSION: 1. No acute intracranial abnormality. 2. Right posterior scalp hematoma. No  underlying calvarial fracture. 3. No acute fracture in the cervical spine. 4. Acute mildly displaced fracture of the posterior right first rib. 5. Partially visualized mildly displaced fracture of the right mid clavicle. Electronically Signed   By:  Norman Gatlin M.D.   On: 02/10/2024 03:34   CT Cervical Spine Wo Contrast Result Date: 02/10/2024 CLINICAL DATA:  Head trauma, fell down a flight of steps. EXAM: CT HEAD WITHOUT CONTRAST CT CERVICAL SPINE WITHOUT CONTRAST TECHNIQUE: Multidetector CT imaging of the head and cervical spine was performed following the standard protocol without intravenous contrast. Multiplanar CT image reconstructions of the cervical spine were also generated. RADIATION DOSE REDUCTION: This exam was performed according to the departmental dose-optimization program which includes automated exposure control, adjustment of the mA and/or kV according to patient size and/or use of iterative reconstruction technique. COMPARISON:  None Available. FINDINGS: CT HEAD FINDINGS Brain: No intracranial hemorrhage, mass effect, or evidence of acute infarct. No hydrocephalus. No extra-axial fluid collection. Vascular: No hyperdense vessel or unexpected calcification. Skull: No fracture or focal lesion.  Right posterior scalp hematoma. Sinuses/Orbits: No acute finding. Paranasal sinuses and mastoid air cells are well aerated. Other: None. CT CERVICAL SPINE FINDINGS Alignment: No evidence of traumatic malalignment. Skull base and vertebrae: No acute fracture. No primary bone lesion or focal pathologic process. Soft tissues and spinal canal: No prevertebral fluid or swelling. No visible canal hematoma. Disc levels: Multilevel spondylosis with osteophytic spurring, disc space height loss, and degenerative endplate changes greatest at C4 through C7 where it is moderate to advanced. Multilevel posterior disc osteophyte complexes cause mild effacement of the ventral thecal sac. No severe spinal canal  narrowing. Upper chest: Acute mildly displaced fracture of the posterior right first rib. Partially visualized mildly displaced fracture of the right midclavicle. Other: None. IMPRESSION: 1. No acute intracranial abnormality. 2. Right posterior scalp hematoma. No underlying calvarial fracture. 3. No acute fracture in the cervical spine. 4. Acute mildly displaced fracture of the posterior right first rib. 5. Partially visualized mildly displaced fracture of the right mid clavicle. Electronically Signed   By: Norman Gatlin M.D.   On: 02/10/2024 03:34   DG Shoulder Right Addendum Date: 02/10/2024 ADDENDUM REPORT: 02/10/2024 03:18 ADDENDUM: After comparing with today's chest x-ray, multiple right rib fractures are noted involving the 2nd through 5th ribs. Electronically Signed   By: Franky Crease M.D.   On: 02/10/2024 03:18   Result Date: 02/10/2024 CLINICAL DATA:  Fall, posterior shoulder pain EXAM: RIGHT SHOULDER - 2+ VIEW COMPARISON:  None Available. FINDINGS: There is no evidence of fracture or dislocation. There is no evidence of arthropathy or other focal bone abnormality. Soft tissues are unremarkable. IMPRESSION: Negative. Electronically Signed: By: Franky Crease M.D. On: 02/10/2024 03:15   DG Chest 2 View Result Date: 02/10/2024 CLINICAL DATA:  Fall, right shoulder pain, chest pain EXAM: CHEST - 2 VIEW COMPARISON:  02/07/2012 FINDINGS: Mild elevation of the right hemidiaphragm. Heart is borderline in size. Mild vascular congestion. No confluent opacities, effusions or edema. Multiple right rib fractures involving the 2nd through 5th ribs. No pneumothorax. IMPRESSION: Right 2nd through 5th rib fractures. No visible effusion or pneumothorax. Vascular congestion. Electronically Signed   By: Franky Crease M.D.   On: 02/10/2024 03:17    Anti-infectives: Anti-infectives (From admission, onward)    None        Assessment/Plan Fall down stairs R clavicle fracture - per ortho, Dr. Kendal, non-op,  NWB RUE, sling. F/u 2 weeks. PT/OT R 1st rib fracture, and 3-6 rib fractures - multimodal pain control, IS, pulm toilet, recommend CTA given 1st rib fracture to r/o BCVI    FEN: reg diet VTE: would recommend LMWH BID per trauma guidelines, ASA if BCVI noted  on CTA ID: no current abx Steinhauser: required I/O overnight. Bladder scan prn. No hgb or RBC on UA. Defer to primary.  Dispo: PT/OT. CTA pending. We will follow peripherally and f/u on CTA.   - per TRH -  HTN HLD T2DM Cardiac murmur DOE Obesity class II - BMI 36.0  Hx of cervical/vulvar cancer  Depression/anxiety  I reviewed nursing notes, Consultant (Ortho) notes, hospitalist notes, last 24 h vitals and pain scores, last 48 h intake and output, last 24 h labs and trends, and last 24 h imaging results.    LOS: 0 days    Patricia Delacruz, [facility] 02/11/2024, 7:52 AM Please see Amion for pager number during day hours 7:00am-4:30pm

## 2024-02-11 NOTE — Progress Notes (Addendum)
 Occupational Therapy Treatment Patient Details Name: Patricia Delacruz MRN: [id number] DOB: 10/18/1955 Today's Date: 02/11/2024   History of present illness Patient is a 68 y/o female admitted 02/09/24 with fall down stairs at home noted R clavicle fx, R first rib, and 3-6 rib fx.  PMH positive for DM, CIN II (cervical intraepithelial neoplasia), h/o cervical/vulvar cancer, HTN, HLD and obesity.   OT comments  Pt. Seen for skilled OT treatment session with family present.  Instructed and demonstrated don/doff of sling for family.  They verbalized understanding.  Pt. Able to tolerate AROM R digits, wrist, elbow.  Pt. Declined attempt at pendulums secondary to pain and nausea.  Sit/stand x1 in attempt for pendulums and pt. Requests to sit secondary to nausea/dizziness. (nausea meds given prior to session).  Reviewed HEP/ROM 2-3x a day and benefits of up to chair throughout the day especially for meals.  Cont. With acute OT POC and attempt pendulums next session as pt. Able to tolerate.        If plan is discharge home, recommend the following:  A little help with walking and/or transfers;A lot of help with bathing/dressing/bathroom;Assistance with cooking/housework;Assist for transportation;Help with stairs or ramp for entrance   Equipment Recommendations  None recommended by OT    Recommendations for Other Services      Precautions / Restrictions Precautions Precautions: Fall Required Braces or Orthoses: Sling Restrictions RUE Weight Bearing Per Provider Order: Non weight bearing Other Position/Activity Restrictions: PA from ortho approved use of rollator for balance if tolerated without pushing weight on R hand       Mobility Bed Mobility                    Transfers                         Balance                                           ADL either performed or assessed with clinical judgement   ADL Overall ADL's : Needs assistance/impaired                  Upper Body Dressing : Maximal assistance;With caregiver independent assisting;Cueing for sequencing;Sitting Upper Body Dressing Details (indicate cue type and reason): demonstrated/educated pts. spouse and dtr. on proper positioning of sling don/doff also placement of wash cloth if needed on L side of neck for comfort.  Encouraged pt. To use LUE to assist RUE she declined at this time due to pain.                          Extremity/Trunk Assessment Upper Extremity Assessment RUE Deficits / Details: elbow, wrist and hadn ROm is [facility]. MMT not testes due to injury. Shoulder remained immobilized RUE Sensation: WNL RUE Coordination: decreased fine motor;decreased gross motor            Vision       Perception     Praxis     Communication Communication Communication: No apparent difficulties   Cognition Arousal: Alert Behavior During Therapy: WFL for tasks assessed/performed Cognition: No apparent impairments                               Following commands:  Intact        Cueing   Cueing Techniques: Verbal cues  Exercises General Exercises - Upper Extremity Elbow Flexion: AROM, Right, 5 reps, Seated Elbow Extension: AROM, Right, Seated Wrist Flexion: AROM, Right, 5 reps, Seated Wrist Extension: AROM, Right, 5 reps, Seated Digit Composite Flexion: AROM, Right, 5 reps, Seated Composite Extension: AROM, Right, 5 reps, Seated    Shoulder Instructions       General Comments      Pertinent Vitals/ Pain       Pain Assessment Pain Assessment: Faces Faces Pain Scale: Hurts even more Pain Location: R shoulder, and nausea Pain Descriptors / Indicators: Aching  Home Living                                          Prior Functioning/Environment              Frequency  Min 2X/week        Progress Toward Goals  OT Goals(current goals can now be found in the care plan section)  Progress towards OT goals:  Progressing toward goals     Plan      Co-evaluation                 AM-PAC OT 6 Clicks Daily Activity     Outcome Measure   Help from another person eating meals?: None Help from another person taking care of personal grooming?: A Little Help from another person toileting, which includes using toliet, bedpan, or urinal?: A Lot Help from another person bathing (including washing, rinsing, drying)?: A Lot Help from another person to put on and taking off regular upper body clothing?: A Lot Help from another person to put on and taking off regular lower body clothing?: A Lot 6 Click Score: 15    End of Session    OT Visit Diagnosis: Unsteadiness on feet (R26.81);Other abnormalities of gait and mobility (R26.89);Muscle weakness (generalized) (M62.81);Pain;History of falling (Z91.81)   Activity Tolerance Patient tolerated treatment well   Patient Left in chair;with call bell/phone within reach;with family/visitor present   Nurse Communication Other (comment);Mobility status (alerted rn chair alarm not working, family/pt. report they will not get out of chair without assistance from staff)        Time: 9047-8982 OT Time Calculation (min): 25 min  Charges: OT General Charges $OT Visit: 1 Visit OT Treatments $Self Care/Home Management : 8-22 mins $Therapeutic Exercise: 8-22 mins  Patricia, COTA/L Acute Rehabilitation [phone]   Patricia Delacruz COTA/L 02/11/2024, 11:03 AM

## 2024-02-12 ENCOUNTER — Inpatient Hospital Stay (HOSPITAL_COMMUNITY)

## 2024-02-12 DIAGNOSIS — F419 Anxiety disorder, unspecified: Secondary | ICD-10-CM | POA: Diagnosis present

## 2024-02-12 DIAGNOSIS — Z9049 Acquired absence of other specified parts of digestive tract: Secondary | ICD-10-CM | POA: Diagnosis not present

## 2024-02-12 DIAGNOSIS — I350 Nonrheumatic aortic (valve) stenosis: Secondary | ICD-10-CM | POA: Diagnosis present

## 2024-02-12 DIAGNOSIS — R32 Unspecified urinary incontinence: Secondary | ICD-10-CM | POA: Diagnosis not present

## 2024-02-12 DIAGNOSIS — F32A Depression, unspecified: Secondary | ICD-10-CM | POA: Diagnosis present

## 2024-02-12 DIAGNOSIS — Z833 Family history of diabetes mellitus: Secondary | ICD-10-CM | POA: Diagnosis not present

## 2024-02-12 DIAGNOSIS — I110 Hypertensive heart disease with heart failure: Secondary | ICD-10-CM | POA: Diagnosis present

## 2024-02-12 DIAGNOSIS — S2241XD Multiple fractures of ribs, right side, subsequent encounter for fracture with routine healing: Secondary | ICD-10-CM | POA: Diagnosis not present

## 2024-02-12 DIAGNOSIS — E66812 Obesity, class 2: Secondary | ICD-10-CM | POA: Diagnosis present

## 2024-02-12 DIAGNOSIS — Z8249 Family history of ischemic heart disease and other diseases of the circulatory system: Secondary | ICD-10-CM | POA: Diagnosis not present

## 2024-02-12 DIAGNOSIS — E1165 Type 2 diabetes mellitus with hyperglycemia: Secondary | ICD-10-CM | POA: Diagnosis present

## 2024-02-12 DIAGNOSIS — S270XXA Traumatic pneumothorax, initial encounter: Secondary | ICD-10-CM | POA: Diagnosis not present

## 2024-02-12 DIAGNOSIS — N179 Acute kidney failure, unspecified: Secondary | ICD-10-CM | POA: Diagnosis present

## 2024-02-12 DIAGNOSIS — Z7984 Long term (current) use of oral hypoglycemic drugs: Secondary | ICD-10-CM | POA: Diagnosis not present

## 2024-02-12 DIAGNOSIS — Z6836 Body mass index (BMI) 36.0-36.9, adult: Secondary | ICD-10-CM | POA: Diagnosis not present

## 2024-02-12 DIAGNOSIS — S0990XA Unspecified injury of head, initial encounter: Secondary | ICD-10-CM | POA: Diagnosis present

## 2024-02-12 DIAGNOSIS — S42021A Displaced fracture of shaft of right clavicle, initial encounter for closed fracture: Secondary | ICD-10-CM | POA: Diagnosis present

## 2024-02-12 DIAGNOSIS — S2241XA Multiple fractures of ribs, right side, initial encounter for closed fracture: Secondary | ICD-10-CM | POA: Diagnosis present

## 2024-02-12 DIAGNOSIS — I951 Orthostatic hypotension: Secondary | ICD-10-CM | POA: Diagnosis not present

## 2024-02-12 DIAGNOSIS — I5030 Unspecified diastolic (congestive) heart failure: Secondary | ICD-10-CM | POA: Diagnosis present

## 2024-02-12 DIAGNOSIS — Z79899 Other long term (current) drug therapy: Secondary | ICD-10-CM | POA: Diagnosis not present

## 2024-02-12 DIAGNOSIS — S2249XA Multiple fractures of ribs, unspecified side, initial encounter for closed fracture: Secondary | ICD-10-CM | POA: Diagnosis present

## 2024-02-12 DIAGNOSIS — W109XXA Fall (on) (from) unspecified stairs and steps, initial encounter: Secondary | ICD-10-CM | POA: Diagnosis present

## 2024-02-12 DIAGNOSIS — K219 Gastro-esophageal reflux disease without esophagitis: Secondary | ICD-10-CM | POA: Diagnosis present

## 2024-02-12 DIAGNOSIS — E785 Hyperlipidemia, unspecified: Secondary | ICD-10-CM | POA: Diagnosis present

## 2024-02-12 DIAGNOSIS — M549 Dorsalgia, unspecified: Secondary | ICD-10-CM | POA: Diagnosis present

## 2024-02-12 DIAGNOSIS — Z751 Person awaiting admission to adequate facility elsewhere: Secondary | ICD-10-CM | POA: Diagnosis not present

## 2024-02-12 LAB — COMPREHENSIVE METABOLIC PANEL WITH GFR
ALT: 22 U/L (ref 0–44)
AST: 26 U/L (ref 15–41)
Albumin: 3.1 g/dL — ABNORMAL LOW (ref 3.5–5.0)
Alkaline Phosphatase: 82 U/L (ref 38–126)
Anion gap: 16 — ABNORMAL HIGH (ref 5–15)
BUN: 32 mg/dL — ABNORMAL HIGH (ref 8–23)
CO2: 21 mmol/L — ABNORMAL LOW (ref 22–32)
Calcium: 9.1 mg/dL (ref 8.9–10.3)
Chloride: 97 mmol/L — ABNORMAL LOW (ref 98–111)
Creatinine, Ser: 1.19 mg/dL — ABNORMAL HIGH (ref 0.44–1.00)
GFR, Estimated: 50 mL/min — ABNORMAL LOW (ref >60)
Glucose, Bld: 139 mg/dL — ABNORMAL HIGH (ref 70–99)
Potassium: 5 mmol/L (ref 3.5–5.1)
Sodium: 134 mmol/L — ABNORMAL LOW (ref 135–145)
Total Bilirubin: 0.7 mg/dL (ref 0.0–1.2)
Total Protein: 6.6 g/dL (ref 6.5–8.1)

## 2024-02-12 LAB — CBC WITH DIFFERENTIAL/PLATELET
Abs Immature Granulocytes: 0.05 K/uL (ref 0.00–0.07)
Basophils Absolute: 0.1 K/uL (ref 0.0–0.1)
Basophils Relative: 1 %
Eosinophils Absolute: 0.1 K/uL (ref 0.0–0.5)
Eosinophils Relative: 1 %
HCT: 36.2 % (ref 36.0–46.0)
Hemoglobin: 11.5 g/dL — ABNORMAL LOW (ref 12.0–15.0)
Immature Granulocytes: 0 %
Lymphocytes Relative: 15 %
Lymphs Abs: 1.7 K/uL (ref 0.7–4.0)
MCH: 26.1 pg (ref 26.0–34.0)
MCHC: 31.8 g/dL (ref 30.0–36.0)
MCV: 82.1 fL (ref 80.0–100.0)
Monocytes Absolute: 0.7 K/uL (ref 0.1–1.0)
Monocytes Relative: 6 %
Neutro Abs: 8.7 K/uL — ABNORMAL HIGH (ref 1.7–7.7)
Neutrophils Relative %: 77 %
Platelets: 254 K/uL (ref 150–400)
RBC: 4.41 MIL/uL (ref 3.87–5.11)
RDW: 16 % — ABNORMAL HIGH (ref 11.5–15.5)
WBC: 11.3 K/uL — ABNORMAL HIGH (ref 4.0–10.5)
nRBC: 0 % (ref 0.0–0.2)

## 2024-02-12 LAB — GLUCOSE, CAPILLARY
Glucose-Capillary: 172 mg/dL — ABNORMAL HIGH (ref 70–99)
Glucose-Capillary: 143 mg/dL — ABNORMAL HIGH (ref 70–99)
Glucose-Capillary: 177 mg/dL — ABNORMAL HIGH (ref 70–99)
Glucose-Capillary: 168 mg/dL — ABNORMAL HIGH (ref 70–99)

## 2024-02-12 MED ORDER — HYDROMORPHONE HCL 1 MG/ML IJ SOLN: 0.5000 mg | INTRAMUSCULAR | Status: DC | PRN

## 2024-02-12 MED ORDER — IOHEXOL 350 MG/ML SOLN
75.0000 mL | Freq: Once | INTRAVENOUS | Status: AC | PRN
  Administered 2024-02-12: 75 mL via INTRAVENOUS

## 2024-02-12 MED ORDER — OXYCODONE HCL 5 MG PO TABS
7.5000 mg | ORAL_TABLET | ORAL | Status: DC | PRN
  Administered 2024-02-13: 7.5 mg via ORAL
  Filled 2024-02-12 (×2): qty 2

## 2024-02-12 MED ORDER — PANTOPRAZOLE SODIUM 40 MG IV SOLR
40.0000 mg | INTRAVENOUS | Status: DC
  Administered 2024-02-12 – 2024-02-13 (×2): 40 mg via INTRAVENOUS
  Filled 2024-02-12 (×2): qty 10

## 2024-02-12 MED ORDER — HYDROMORPHONE HCL 1 MG/ML IJ SOLN
1.0000 mg | INTRAMUSCULAR | Status: DC | PRN
  Administered 2024-02-12 – 2024-02-17 (×11): 1 mg via INTRAVENOUS
  Filled 2024-02-12 (×11): qty 1

## 2024-02-12 MED ORDER — GABAPENTIN 100 MG PO CAPS
100.0000 mg | ORAL_CAPSULE | Freq: Three times a day (TID) | ORAL | Status: DC
  Administered 2024-02-12 – 2024-02-21 (×27): 100 mg via ORAL
  Filled 2024-02-12 (×27): qty 1

## 2024-02-12 MED ORDER — OXYCODONE HCL 5 MG PO TABS
15.0000 mg | ORAL_TABLET | ORAL | Status: DC | PRN
  Administered 2024-02-12 – 2024-02-14 (×3): 15 mg via ORAL
  Filled 2024-02-12 (×3): qty 3

## 2024-02-12 MED ORDER — CHLORHEXIDINE GLUCONATE CLOTH 2 % EX PADS
6.0000 | MEDICATED_PAD | Freq: Every day | CUTANEOUS | Status: DC
  Administered 2024-02-12 – 2024-02-21 (×8): 6 via TOPICAL

## 2024-02-12 MED ORDER — ALUM & MAG HYDROXIDE-SIMETH 200-200-20 MG/5ML PO SUSP
30.0000 mL | Freq: Once | ORAL | Status: AC
  Administered 2024-02-12: 30 mL via ORAL
  Filled 2024-02-12: qty 30

## 2024-02-12 NOTE — Progress Notes (Signed)
 Spoke to Phlebo Darmvini informed this PT needs blood Draw.

## 2024-02-12 NOTE — Progress Notes (Signed)
 Physical Therapy Treatment Patient Details Name: Patricia Delacruz MRN: [id number] DOB: 03/29/1956 Today's Date: 02/12/2024   History of Present Illness Patient is a 68 y/o female admitted 02/09/24 with fall down stairs at home noted R clavicle fx, R first rib, and 3-6 rib fx.  PMH positive for DM, CIN II (cervical intraepithelial neoplasia), h/o cervical/vulvar cancer, HTN, HLD and obesity.    PT Comments  Pt's pain appears to be poorly controlled. She is reluctant to engage in functional mobility d/t fear and anxiety over the anticipation of pain. Pt required maxA for bed mobility. She sat EOB for less than 10 seconds with maxA for support. RN notified of pt's request for pain medication and came in towards the end of session to administer it. Encouraged pt to transfer OOB>chair with nursing once pain is better managed. Will continue to follow acutely and advance appropriately.     If plan is discharge home, recommend the following: A little help with walking and/or transfers;A little help with bathing/dressing/bathroom;Help with stairs or ramp for entrance   Can travel by private vehicle        Equipment Recommendations  None recommended by PT    Recommendations for Other Services       Precautions / Restrictions Precautions Precautions: Fall Recall of Precautions/Restrictions: Impaired Required Braces or Orthoses: Sling Restrictions Weight Bearing Restrictions Per Provider Order: Yes RUE Weight Bearing Per Provider Order: Non weight bearing     Mobility  Bed Mobility Overal bed mobility: Needs Assistance Bed Mobility: Supine to Sit, Sit to Supine     Supine to sit: HOB elevated, Max assist Sit to supine: Max assist   General bed mobility comments: Pt sat up on L side of bed with increased time. She slowly brought BLE off EOB. Pt intermittently returning LEs back onto bed. Cues for sequencing. Assist to pivot onto side and elevate trunk with use of bed pad. Pt sat up for  ~10seconds reporting worsening pain and need to return to bed. Assist to lower trunk and bring BLE back into bed. Repositioned using bed features and +2 assist.    Transfers Overall transfer level: Needs assistance                 General transfer comment: Unable to attempt d/t worsening pain. Encouraged pt to transfer to recliner chair with nursing or mobility later today once her pain is better managed.    Ambulation/Gait                   Stairs             Wheelchair Mobility     Tilt Bed    Modified Rankin (Stroke Patients Only)       Balance Overall balance assessment: Needs assistance Sitting-balance support: Single extremity supported, Feet supported Sitting balance-Leahy Scale: Poor Sitting balance - Comments: Pt required maxA to maintain upright posture, actively attempting to return to bed.                                    Communication Communication Communication: No apparent difficulties  Cognition Arousal: Alert Behavior During Therapy: Anxious   PT - Cognitive impairments: Awareness, Initiation, Sequencing, Problem solving, Safety/Judgement                       PT - Cognition Comments: Pt nervous to attempt mobility d/t anticipation of pain.  Required moderate education and encouragement for pt to agree to participate in therapy. Following commands: Impaired Following commands impaired: Follows one step commands inconsistently, Follows one step commands with increased time    Cueing Cueing Techniques: Verbal cues, Gestural cues  Exercises      General Comments General comments (skin integrity, edema, etc.): Attempted to assess orthostatics given pt's hypotension last session. Supine BP 156/69. Unable to maintained seated EOB for further BP assessment. Pt denied dizziness/lightheadedness.      Pertinent Vitals/Pain Pain Assessment Pain Assessment: 0-10 Pain Score: 9  Pain Location: RUE Pain  Descriptors / Indicators: Grimacing, Guarding, Discomfort, Burning, Aching, Constant Pain Intervention(s): Monitored during session, Limited activity within patient's tolerance, Repositioned, Patient requesting pain meds-RN notified    Home Living                          Prior Function            PT Goals (current goals can now be found in the care plan section) Acute Rehab PT Goals Patient Stated Goal: Have less pain Progress towards PT goals: Not progressing toward goals - comment (limited by pain)    Frequency    Min 2X/week      PT Plan      Co-evaluation              AM-PAC PT 6 Clicks Mobility   Outcome Measure  Help needed turning from your back to your side while in a flat bed without using bedrails?: A Lot Help needed moving from lying on your back to sitting on the side of a flat bed without using bedrails?: A Lot Help needed moving to and from a bed to a chair (including a wheelchair)?: A Lot Help needed standing up from a chair using your arms (e.g., wheelchair or bedside chair)?: A Lot Help needed to walk in hospital room?: A Lot Help needed climbing 3-5 steps with a railing? : A Lot 6 Click Score: 12    End of Session   Activity Tolerance: Patient limited by pain Patient left: in bed;with call bell/phone within reach;with bed alarm set Nurse Communication: Mobility status;Patient requests pain meds;Other (comment) (Recommendation to transfer to recliner chair) PT Visit Diagnosis: Other abnormalities of gait and mobility (R26.89);History of falling (Z91.81);Pain Pain - Right/Left: Right Pain - part of body: Shoulder;Arm     Time: 1411-1431 PT Time Calculation (min) (ACUTE ONLY): 20 min  Charges:    $Therapeutic Activity: 8-22 mins PT General Charges $$ ACUTE PT VISIT: 1 Visit                     Randall SAUNDERS, PT, DPT Acute Rehabilitation Services Office: [phone] Secure Chat Preferred  Patricia Delacruz 02/12/2024, 3:24  PM

## 2024-02-12 NOTE — Plan of Care (Signed)
  Problem: Coping: Goal: Ability to adjust to condition or change in health will improve Outcome: Progressing   Problem: Skin Integrity: Goal: Risk for impaired skin integrity will decrease Outcome: Progressing   Problem: Clinical Measurements: Goal: Ability to maintain clinical measurements within normal limits will improve Outcome: Progressing Goal: Will remain free from infection Outcome: Progressing   Problem: Activity: Goal: Risk for activity intolerance will decrease Outcome: Progressing   Problem: Nutrition: Goal: Adequate nutrition will be maintained Outcome: Progressing   Problem: Safety: Goal: Ability to remain free from injury will improve Outcome: Progressing

## 2024-02-12 NOTE — Progress Notes (Signed)
 PROGRESS NOTE    Patricia Delacruz  FMW:[id number] DOB: April 23, 1956 DOA: 02/10/2024 PCP: Valma Carwin, MD  Chief Complaint  Patient presents with   Fall    Brief Narrative:   Patricia Delacruz is Patricia Delacruz 68 y.o. female with medical history significant of anxiety, depression, CIN-2, condyloma acuminata, depression, hyperlipidemia, hypertension, fibroid, type 2 diabetes, class II obesity who had Patricia Delacruz mechanical fall at home going down the stairs developing pain in her right posterior scalp, right shoulder and right chest wall.  Denied any prodromal symptoms. He denied fever, chills, rhinorrhea, sore throat, wheezing or hemoptysis.  No chest pain, palpitations, diaphoresis, PND, orthopnea or pitting edema of the lower extremities.  No abdominal pain, nausea, emesis, diarrhea, constipation, melena or hematochezia.  No flank pain, dysuria, frequency or hematuria.  No polyuria, polydipsia, polyphagia or blurred vision.    Lab work: CBC 0 white count of 14.9, hemoglobin 11.7 grams per deciliter and platelets 225.  CMP showed Patricia Delacruz glucose of albumin 3.0 g/dL, 768 and BUN of 25 mg/dL.  The rest of the CMP measurements were normal after calcium  was corrected.   Imaging: Right shoulder x-ray negative.  2 view chest radiograph with right 2nd through 5th rib fractures.  No effusion or pneumothorax.  There was vascular congestion.  CT head without contrast with no acute intercranial normality.  There was right posterior scalp hematoma.  No underlying calvarial Patricia Delacruz.  CT cervical spine with no vertebral acute fracture.  Acute mildly displaced fracture of the posterior right first rib.  Partially visualized mildly displaced fracture of the right mid clavicle.  CT chest/abdomen/pelvis with contrast show acute fracture of the mid right clavicle.  There were acute fractures posterior right first and 3rd through 6th ribs.  Suspect nondisplaced fracture of the posterior right second rib.  No pneumothorax or pleural effusion.  No evidence for  acute traumatic injury in the abdomen or pelvis.  Small hiatal hernia.  Aortic atherosclerosis.  CT thoracic spine showing osteopenia and degenerative change.  Rib fractures as above.  There was cardiomegaly with CAD.  Chronic mild anterior wedging of the L1 vertebral body.  Multilevel DDD and joint changes with spinal stenosis L3-4, L4-5 and L5-S1.  Foraminal stenosis is greatest at L5-S1.   ED course: Initial vital signs were temperature 97.7 F, pulse 73, respiration 16, blood pressure 145/96 mmHg O2 sat 96% on room air.  Patient was given supplemental oxygen, fentanyl  50 mcg IVP x 1, morphine  4 mg IVP x 1 and ondansetron  4 mg IVP x 1.  I added ketorolac  15 mg IVP x 1.    Assessment & Plan:   Principal Problem:   Fracture of multiple ribs Active Problems:   Depression   Anxiety   Hyperlipidemia   Hypertension   Obesity (BMI 35.0-39.9 without comorbidity)   Clavicle fracture   Fall   Type 2 diabetes mellitus with hyperglycemia (HCC)   Grade I diastolic dysfunction  Right Mid Clavicle Fracture Right 1st Rib Fracture  Right 3rd-6th Rib Fracture.  Nondisplaced Fracture Posterior R 2nd Rib Mechanical Fall Appreciate ortho - Non op management of clavicle fracture - NWB to RUE, sling immobilization, ok for wrist and elbow motion as tolerated.  Ok to start gentle pendulum exercises of the shoulder.   Follow up with Dr. Kendal 2 weeks after discharge for repeat x rays. Appreciate surgery - awaiting CTA given first rib fracture - pain management, IS, pulm toilet  AKI Improved today, will continue to trend  Orthostatic Hypotension C/o LH/nausea  with standing  Hold ramipril  Continue IVF for now  Hypertension Ramipril  on hold with above  HFpEF Apparently didn't tolerate beta blocker Holding ramipril  Echo with EF 60-65% - mild AS in May 2025  Depression  Anxiety Wellbutrin , cymbalta , ativan  prn, ambien  nightly T2DM  Pioglitazone   A1c 6.7   GERD PPI  Urinary Retention Place  [name], when she's more mobile can do TOV  Obesity Body mass index is 32.47 kg/m.    DVT prophylaxis: SCD - per surgery, LMWH BID per trauma guidelines vs ASA in BCVI noted on CTA Code Status: full Family Communication: family at bedside Disposition:   Status is: Observation The patient remains OBS appropriate and will d/c before 2 midnights.   Consultants:  Surgery ortho  Procedures:  none  Antimicrobials:  Anti-infectives (From admission, onward)    None       Subjective: C/o pain under her arm and below her clavicle  Doesn't want to move due to the pain   Objective: Vitals:   02/11/24 2043 02/12/24 0432 02/12/24 0500 02/12/24 0758  BP: (!) 121/56 (!) 124/56  (!) 142/66  Pulse: 85 94  99  Resp: 16 15  14   Temp: 98.1 F (36.7 C) 98 F (36.7 C)  98.1 F (36.7 C)  TempSrc: Oral   Oral  SpO2: 91% 95%  91%  Weight:   108.6 kg   Height:        Intake/Output Summary (Last 24 hours) at 02/12/2024 1514 Last data filed at 02/12/2024 1200 Gross per 24 hour  Intake 1100 ml  Output 1160 ml  Net -60 ml   Filed Weights   02/11/24 0544 02/11/24 1600 02/12/24 0500  Weight: 107.6 kg 107.6 kg 108.6 kg    Examination:  General: appears very uncomfortable, staying still like she doesn't want to move Cardiovascular: RRR Lungs: unlabored Abdomen: Soft, nontender, nondistended Neurological: Alert and oriented 3. Moves all extremities 4 with equal strength. Cranial nerves II through XII grossly intact. Extremities: No clubbing or cyanosis. No edema  Data Reviewed: I have personally reviewed following labs and imaging studies  CBC: Recent Labs  Lab 02/10/24 0338 02/11/24 1438  WBC 14.9* 11.6*  NEUTROABS 12.5* 8.5*  HGB 11.7* 11.6*  HCT 38.9 37.2  MCV 85.7 84.5  PLT 225 264    Basic Metabolic Panel: Recent Labs  Lab 02/10/24 0338 02/11/24 1438 02/12/24 1420  NA 135 133* 134*  K 3.5 3.9 5.0  CL 100 96* 97*  CO2 25 24 21*  GLUCOSE 231* 152* 139*   BUN 25* 39* 32*  CREATININE 0.82 2.15* 1.19*  CALCIUM  8.4* 9.0 9.1    GFR: Estimated Creatinine Clearance: 64.1 mL/min (Patricia Delacruz) (by C-G formula based on SCr of 1.19 mg/dL (H)).  Liver Function Tests: Recent Labs  Lab 02/10/24 0338 02/11/24 1438 02/12/24 1420  AST 19 32 26  ALT 20 27 22   ALKPHOS 66 74 82  BILITOT 0.3 0.7 0.7  PROT 6.7 6.3* 6.6  ALBUMIN 3.0* 3.3* 3.1*    CBG: Recent Labs  Lab 02/11/24 1145 02/11/24 1712 02/11/24 2116 02/12/24 0759 02/12/24 1152  GLUCAP 105* 153* 125* 172* 143*     No results found for this or any previous visit (from the past 240 hours).       Radiology Studies: No results found.       Scheduled Meds:  acetaminophen   1,000 mg Oral Q6H   buPROPion   450 mg Oral Daily   Chlorhexidine  Gluconate Cloth  6 each Topical  Daily   DULoxetine   30 mg Oral Daily   gabapentin   100 mg Oral TID   insulin  aspart  0-9 Units Subcutaneous TID WC   lidocaine   2 patch Transdermal Q24H   methocarbamol   500 mg Oral TID   pantoprazole   40 mg Oral Daily   pioglitazone   30 mg Oral Daily   polyethylene glycol  17 g Oral BID   Continuous Infusions:  lactated ringers  125 mL/hr at 02/12/24 0941     LOS: 0 days    Time spent: over 30 min     Meliton Monte, MD [facility]   To contact the attending provider between 7A-7P or the covering provider during after hours 7P-7A, please log into the web site [email or web address] and access using universal [place] password for that web site. If you do not have the password, please call the hospital operator.  02/12/2024, 3:14 PM

## 2024-02-12 NOTE — Plan of Care (Signed)

## 2024-02-13 ENCOUNTER — Inpatient Hospital Stay (HOSPITAL_COMMUNITY)

## 2024-02-13 DIAGNOSIS — S2241XA Multiple fractures of ribs, right side, initial encounter for closed fracture: Secondary | ICD-10-CM | POA: Diagnosis not present

## 2024-02-13 LAB — BASIC METABOLIC PANEL WITH GFR
Anion gap: 9 (ref 5–15)
BUN: 26 mg/dL — ABNORMAL HIGH (ref 8–23)
CO2: 24 mmol/L (ref 22–32)
Calcium: 8.8 mg/dL — ABNORMAL LOW (ref 8.9–10.3)
Chloride: 100 mmol/L (ref 98–111)
Creatinine, Ser: 0.83 mg/dL (ref 0.44–1.00)
GFR, Estimated: >60 mL/min (ref >60)
Glucose, Bld: 165 mg/dL — ABNORMAL HIGH (ref 70–99)
Potassium: 4.3 mmol/L (ref 3.5–5.1)
Sodium: 133 mmol/L — ABNORMAL LOW (ref 135–145)

## 2024-02-13 LAB — CBC
HCT: 36.7 % (ref 36.0–46.0)
Hemoglobin: 11.3 g/dL — ABNORMAL LOW (ref 12.0–15.0)
MCH: 25.7 pg — ABNORMAL LOW (ref 26.0–34.0)
MCHC: 30.8 g/dL (ref 30.0–36.0)
MCV: 83.4 fL (ref 80.0–100.0)
Platelets: 272 K/uL (ref 150–400)
RBC: 4.4 MIL/uL (ref 3.87–5.11)
RDW: 16.2 % — ABNORMAL HIGH (ref 11.5–15.5)
WBC: 11.5 K/uL — ABNORMAL HIGH (ref 4.0–10.5)
nRBC: 0 % (ref 0.0–0.2)

## 2024-02-13 LAB — MAGNESIUM: Magnesium: 2.1 mg/dL (ref 1.7–2.4)

## 2024-02-13 LAB — GLUCOSE, CAPILLARY
Glucose-Capillary: 187 mg/dL — ABNORMAL HIGH (ref 70–99)
Glucose-Capillary: 220 mg/dL — ABNORMAL HIGH (ref 70–99)
Glucose-Capillary: 134 mg/dL — ABNORMAL HIGH (ref 70–99)
Glucose-Capillary: 165 mg/dL — ABNORMAL HIGH (ref 70–99)

## 2024-02-13 LAB — PHOSPHORUS: Phosphorus: 3.3 mg/dL (ref 2.5–4.6)

## 2024-02-13 MED ORDER — ENOXAPARIN SODIUM 30 MG/0.3ML IJ SOSY
30.0000 mg | PREFILLED_SYRINGE | Freq: Two times a day (BID) | INTRAMUSCULAR | Status: DC
  Administered 2024-02-13 – 2024-02-21 (×16): 30 mg via SUBCUTANEOUS
  Filled 2024-02-13 (×16): qty 0.3

## 2024-02-13 MED ORDER — METHOCARBAMOL 500 MG PO TABS
1000.0000 mg | ORAL_TABLET | Freq: Three times a day (TID) | ORAL | Status: DC
  Administered 2024-02-13 – 2024-02-21 (×24): 1000 mg via ORAL
  Filled 2024-02-13 (×24): qty 2

## 2024-02-13 MED ORDER — ENOXAPARIN SODIUM 40 MG/0.4ML IJ SOSY
40.0000 mg | PREFILLED_SYRINGE | INTRAMUSCULAR | Status: DC
  Administered 2024-02-13: 40 mg via SUBCUTANEOUS
  Filled 2024-02-13: qty 0.4

## 2024-02-13 NOTE — Progress Notes (Signed)
 Occupational Therapy Treatment Patient Details Name: Patricia Delacruz MRN: [id number] DOB: July 20, 1955 Today's Date: 02/13/2024   History of present illness Patient is a 68 y/o female admitted 02/09/24 with fall down stairs at home noted R clavicle fx, R first rib, and 3-6 rib fx.  PMH positive for DM, CIN II (cervical intraepithelial neoplasia), h/o cervical/vulvar cancer, HTN, HLD and obesity.   OT comments  Pt is making limited progress towards their acute OT goals. Overall she continues to need significant assist for bed mobility, but progresses from the EOB well. She tolerated increased mobility distance with RW with cues to not weight bear through the RUE. Pt reported improved anxiety with AD use. While sitting she tolerated elbow, wrist and hand HEP but is still limited by with with pendulum attempts. Encouraged IS and flutter valve  use. OT to continue to follow acutely to facilitate progress towards established goals. Pt will continue to benefit from [facility].       If plan is discharge home, recommend the following:  A little help with walking and/or transfers;A lot of help with bathing/dressing/bathroom;Assistance with cooking/housework;Assist for transportation;Help with stairs or ramp for entrance   Equipment Recommendations  Hospital bed       Precautions / Restrictions Precautions Precautions: Fall Recall of Precautions/Restrictions: Impaired Required Braces or Orthoses: Sling Restrictions Weight Bearing Restrictions Per Provider Order: Yes RUE Weight Bearing Per Provider Order: Non weight bearing Other Position/Activity Restrictions: PA from ortho approved use of rollator for balance if tolerated without pushing weight on R hand; Okay for wrist and elbow motion as tolerated. Ok to start gentle pendulum exercises of the shoulder.       Mobility Bed Mobility Overal bed mobility: Needs Assistance Bed Mobility: Rolling, Sidelying to Sit Rolling: Mod assist, +2 for physical  assistance Sidelying to sit: Mod assist, +2 for physical assistance       General bed mobility comments: pt rolled to L, needed mod A from behind and at trunk to come up to sitting. Needed mod A to scoot R hip fwd to EOB.    Transfers Overall transfer level: Needs assistance Equipment used: Rolling walker (2 wheels) Transfers: Sit to/from Stand Sit to Stand: Min assist, +2 safety/equipment           General transfer comment: vc's for L hand placement on bed. Sling in place until standing when it was loosened for pt to grasp RW with no wt through RUE. Min A needed for power up     Balance Overall balance assessment: Needs assistance Sitting-balance support: Single extremity supported, Feet supported Sitting balance-Leahy Scale: Fair Sitting balance - Comments: maintained sitting EOB with supervision   Standing balance support: No upper extremity supported, During functional activity Standing balance-Leahy Scale: Fair                             ADL either performed or assessed with clinical judgement   ADL Overall ADL's : Needs assistance/impaired                         Toilet Transfer: Minimal assistance;+2 for safety/equipment Toilet Transfer Details (indicate cue type and reason): simulated         Functional mobility during ADLs: Minimal assistance General ADL Comments: benefits from encourgaement    Extremity/Trunk Assessment Upper Extremity Assessment Upper Extremity Assessment: RUE deficits/detail RUE Deficits / Details: elbow, wrist and hand ROM is WFL. Pt is  fearful for AROM but is able to tolerate without increase in pain. Pt is still unable to tolerate pendulums RUE Sensation: WNL RUE Coordination: decreased fine motor;decreased gross motor   Lower Extremity Assessment Lower Extremity Assessment: Defer to PT evaluation        Vision   Vision Assessment?: No apparent visual deficits   Perception Perception Perception: Not  tested   Praxis Praxis Praxis: Not tested   Communication Communication Communication: No apparent difficulties   Cognition Arousal: Alert Behavior During Therapy: Anxious, Flat affect Cognition: No apparent impairments             OT - Cognition Comments: pt flat throughotu and anxious in regard to exacerbation of pain.                 Following commands: Impaired Following commands impaired: Follows one step commands inconsistently, Follows one step commands with increased time      Cueing   Cueing Techniques: Verbal cues, Gestural cues  Exercises Exercises: Other exercises General Exercises - Upper Extremity Elbow Flexion: AROM, Right, 5 reps, Seated Elbow Extension: AROM, Right, Seated Wrist Flexion: AROM, Right, 5 reps, Seated Wrist Extension: AROM, Right, 5 reps, Seated Digit Composite Flexion: AROM, Right, 5 reps, Seated Composite Extension: AROM, Right, 5 reps, Seated Other Exercises Other Exercises: attempted seated pendulums, pt unabel to tolerate past dangleing    Shoulder Instructions       General Comments VSS, PA present to encourage IS and flutter use    Pertinent Vitals/ Pain       Pain Assessment Pain Assessment: Faces Faces Pain Scale: Hurts even more Pain Location: RUE and mid R back Pain Descriptors / Indicators: Grimacing, Guarding, Discomfort, Burning, Aching, Constant Pain Intervention(s): Limited activity within patient's tolerance   Frequency  Min 2X/week        Progress Toward Goals  OT Goals(current goals can now be found in the care plan section)  Progress towards OT goals: Progressing toward goals  Acute Rehab OT Goals Patient Stated Goal: less pain OT Goal Formulation: With patient Time For Goal Achievement: 02/25/24 Potential to Achieve Goals: Good ADL Goals Pt Will Perform Grooming: with supervision Pt Will Perform Upper Body Dressing: with set-up Pt Will Perform Lower Body Dressing: with min assist Pt  Will Transfer to Toilet: with modified independence Pt/caregiver will Perform Home Exercise Program: Increased ROM;Right Upper extremity;With written HEP provided  Plan      Co-evaluation                 AM-PAC OT 6 Clicks Daily Activity     Outcome Measure   Help from another person eating meals?: None Help from another person taking care of personal grooming?: A Little Help from another person toileting, which includes using toliet, bedpan, or urinal?: A Lot Help from another person bathing (including washing, rinsing, drying)?: A Lot Help from another person to put on and taking off regular upper body clothing?: A Lot Help from another person to put on and taking off regular lower body clothing?: A Lot 6 Click Score: 15    End of Session Equipment Utilized During Treatment: Gait belt;Rolling walker (2 wheels)  OT Visit Diagnosis: Unsteadiness on feet (R26.81);Other abnormalities of gait and mobility (R26.89);Muscle weakness (generalized) (M62.81);Pain;History of falling (Z91.81)   Activity Tolerance Patient tolerated treatment well   Patient Left in chair;with call bell/phone within reach;with family/visitor present   Nurse Communication Other (comment);Mobility status        Time: 8977-8958  OT Time Calculation (min): 19 min  Charges: OT General Charges $OT Visit: 1 Visit OT Treatments $Therapeutic Exercise: 8-22 mins  Lucie Kendall, OTR/L Acute Rehabilitation Services Office [phone] Secure Chat Communication Preferred   Lucie JONETTA Kendall 02/13/2024, 11:26 AM

## 2024-02-13 NOTE — TOC Progression Note (Addendum)
 Transition of Care (TOC) - Progression Note   PT requesting hospital bed for home due to bed mobility . Mod +2   NCM entered order and note, asked MD to sign  Anticiapted discharge date is in 2 days.   [name] is aware and will work on having bed delivered tomorrow   Updated [name] with [name] on anticipated discharge date   1330 MD messaged again possible discahrge tomorrow. Mitch with [employer] updated. OT states she does not need ambulance transport home  Patient Details  Name: Patricia Delacruz MRN: [id number] Date of Birth: [birth year]  Transition of Care [facility]) CM/SW Contact  Guss Farruggia, Powell Jansky, RN Phone Number: 02/13/2024, 10:46 AM  Clinical Narrative:       Expected Discharge Plan: Home w Home Health Services Barriers to Discharge: Continued Medical Work up               Expected Discharge Plan and Services   Discharge Planning Services: CM Consult Post Acute Care Choice: Home Health Living arrangements for the past 2 months: Single Family Home                 DME Arranged: N/A         HH Arranged: PT, OT HH Agency: [name] Date [facility] Agency Contacted: 02/10/24 Time HH Agency Contacted: 1507 Representative spoke with at [facility] Agency: [name]   Social Drivers of Health (SDOH) Interventions SDOH Screenings   Food Insecurity: No Food Insecurity (02/10/2024)  Housing: Low Risk  (02/11/2024)  Transportation Needs: No Transportation Needs (02/11/2024)  Utilities: Not At Risk (02/10/2024)  Social Connections: Socially Integrated (02/10/2024)  Tobacco Use: Low Risk  (02/10/2024)    Readmission Risk Interventions     No data to display

## 2024-02-13 NOTE — TOC CM/SW Note (Signed)
    Durable Medical Equipment  (From admission, onward)           Start     Ordered   02/13/24 1043  For home use only DME Hospital bed  Once       Question Answer Comment  Length of Need Lifetime   Patient has (list medical condition): bed mobility two person assist   Head must be elevated greater than: 45 degrees   Bed type Semi-electric      02/13/24 1045

## 2024-02-13 NOTE — Progress Notes (Signed)
 Physical Therapy Treatment Patient Details Name: Patricia Delacruz MRN: [id number] DOB: 07-28-1955 Today's Date: 02/13/2024   History of Present Illness Patient is a 68 y/o female admitted 02/09/24 with fall down stairs at home noted R clavicle fx, R first rib, and 3-6 rib fx.  PMH positive for DM, CIN II (cervical intraepithelial neoplasia), h/o cervical/vulvar cancer, HTN, HLD and obesity.    PT Comments  Pt progressing towards therapy goals but continues to need mod A +2 for bed mobility due to pain, recommending hospital bed for home. Pt able to stand with min A +2 for power up. Ambulated 67' with use of RW (not putting any wt through RUE). Pt seated in chair end of session, encouraged to increased use of IS and perform LE there ex after pain decreases. Continue to recommend HHPT. PT will continue to follow.     If plan is discharge home, recommend the following: A little help with walking and/or transfers;A little help with bathing/dressing/bathroom;Help with stairs or ramp for entrance   Can travel by private vehicle        Equipment Recommendations  Hospital bed    Recommendations for Other Services       Precautions / Restrictions Precautions Precautions: Fall Recall of Precautions/Restrictions: Impaired Required Braces or Orthoses: Sling Restrictions Weight Bearing Restrictions Per Provider Order: Yes RUE Weight Bearing Per Provider Order: Non weight bearing Other Position/Activity Restrictions: PA from ortho approved use of rollator for balance if tolerated without pushing weight on R hand; Okay for wrist and elbow motion as tolerated. Ok to start gentle pendulum exercises of the shoulder.     Mobility  Bed Mobility Overal bed mobility: Needs Assistance Bed Mobility: Rolling, Sidelying to Sit Rolling: Mod assist, +2 for physical assistance Sidelying to sit: Mod assist, +2 for physical assistance       General bed mobility comments: pt rolled to L, needed mod A from behind  and at trunk to come up to sitting. Needed mod A to scoot R hip fwd to EOB.    Transfers Overall transfer level: Needs assistance Equipment used: Rolling walker (2 wheels) Transfers: Sit to/from Stand Sit to Stand: Min assist, +2 safety/equipment           General transfer comment: vc's for L hand placement on bed. Sling in place until standing when it was loosened for pt to grasp RW with no wt through RUE. Min A needed for power up    Ambulation/Gait Ambulation/Gait assistance: Min assist, +2 safety/equipment Gait Distance (Feet): 80 Feet Assistive device: Rolling walker (2 wheels) Gait Pattern/deviations: Step-through pattern, Decreased stride length, Wide base of support Gait velocity: decreased Gait velocity interpretation: <1.8 ft/sec, indicate of risk for recurrent falls   General Gait Details: pt seemed to feel more confident with use of RW. VSS with mobility, encouragement given in distance   Stairs             Wheelchair Mobility     Tilt Bed    Modified Rankin (Stroke Patients Only)       Balance Overall balance assessment: Needs assistance Sitting-balance support: Single extremity supported, Feet supported Sitting balance-Leahy Scale: Fair Sitting balance - Comments: maintained sitting EOB with supervision   Standing balance support: No upper extremity supported, During functional activity Standing balance-Leahy Scale: Fair                              Communication Communication Communication: No apparent  difficulties  Cognition Arousal: Alert Behavior During Therapy: Anxious, Flat affect   PT - Cognitive impairments: Awareness, Initiation, Sequencing, Problem solving, Safety/Judgement                       PT - Cognition Comments: pt avoidant of mobility and even somewhat hesitant with use of IS. Encouraged her that the more she moves and does breathing exercises the better she will heal Following commands:  Impaired Following commands impaired: Follows one step commands inconsistently, Follows one step commands with increased time    Cueing Cueing Techniques: Verbal cues, Gestural cues  Exercises Other Exercises Other Exercises: vc's for AP, LAQ, and seated marching later in morning while in recliner    General Comments General comments (skin integrity, edema, etc.): Had pt use IS x5 before ambulation, vc's for slowing down breathing. ABle to pull 1500. Hr 94 bpm, SPO2 95% on RA      Pertinent Vitals/Pain Pain Assessment Pain Assessment: Faces Faces Pain Scale: Hurts even more Pain Location: RUE and mid R back Pain Descriptors / Indicators: Grimacing, Guarding, Discomfort, Burning, Aching, Constant Pain Intervention(s): Limited activity within patient's tolerance, Monitored during session, Premedicated before session    Home Living                          Prior Function            PT Goals (current goals can now be found in the care plan section) Acute Rehab PT Goals Patient Stated Goal: Have less pain PT Goal Formulation: With patient Time For Goal Achievement: 02/24/24 Potential to Achieve Goals: Good Progress towards PT goals: Progressing toward goals    Frequency    Min 2X/week      PT Plan      Co-evaluation              AM-PAC PT 6 Clicks Mobility   Outcome Measure  Help needed turning from your back to your side while in a flat bed without using bedrails?: Total Help needed moving from lying on your back to sitting on the side of a flat bed without using bedrails?: Total Help needed moving to and from a bed to a chair (including a wheelchair)?: A Lot Help needed standing up from a chair using your arms (e.g., wheelchair or bedside chair)?: A Lot Help needed to walk in hospital room?: A Little Help needed climbing 3-5 steps with a railing? : A Lot 6 Click Score: 11    End of Session Equipment Utilized During Treatment: Gait belt;Other  (comment) (sling) Activity Tolerance: Patient limited by pain;Patient tolerated treatment well Patient left: with call bell/phone within reach;in chair Nurse Communication: Mobility status PT Visit Diagnosis: Other abnormalities of gait and mobility (R26.89);History of falling (Z91.81);Pain Pain - Right/Left: Right Pain - part of body: Shoulder;Arm     Time: 1010-1022 PT Time Calculation (min) (ACUTE ONLY): 12 min  Charges:    $Gait Training: 8-22 mins PT General Charges $$ ACUTE PT VISIT: 1 Visit                     Richerd Lipoma, PT  Acute Rehab Services Secure chat preferred Office [phone]    Richerd L Kim Lauver 02/13/2024, 11:13 AM

## 2024-02-13 NOTE — Progress Notes (Signed)
 Subjective: CC: R rib pain, worse with movement. Tolerating PO. Walked with PT today CTA this AM negative for vascular injury but showed interval development of pneumothorax.  Objective: Vital signs in last 24 hours: Temp:  [97.9 F (36.6 C)-98.6 F (37 C)] 98.4 F (36.9 C) (08/25 0937) Pulse Rate:  [89-100] 89 (08/25 0937) Resp:  [16-18] 17 (08/25 0937) BP: (133-152)/(63-79) 133/63 (08/25 0937) SpO2:  [90 %-97 %] 92 % (08/25 0937) Weight:  [891 kg] 108 kg (08/25 0500) Last BM Date : 02/09/24  Intake/Output from previous day: 08/24 0701 - 08/25 0700 In: -  Out: 1210 [Urine:1210] Intake/Output this shift: No intake/output data recorded.  PE: Gen:  Alert, NAD, pleasant HEENT: EOM's intact, pupils equal and round Card:  RRR. Radial and DP 2+ b/l. Pulm:  effort normal ORA, 95% ORA after walking, Pulling 1000-1250 on IS Abd: Soft, ND, NT Ext:  RUE in sling, no wrist drop, radial 2+. No ttp and able rom of the major joints for LUE and BLE's.  Psych: A&Ox3  Neuro: CN 3-12 intact. F/c. MAE's. SILT to BUE and BLE's. Non-focal Skin: R knee abrasion. Otherwise warm and dry  Lab Results:  Recent Labs    02/12/24 1455 02/13/24 0713  WBC 11.3* 11.5*  HGB 11.5* 11.3*  HCT 36.2 36.7  PLT 254 272   BMET Recent Labs    02/12/24 1420 02/13/24 0713  NA 134* 133*  K 5.0 4.3  CL 97* 100  CO2 21* 24  GLUCOSE 139* 165*  BUN 32* 26*  CREATININE 1.19* 0.83  CALCIUM  9.1 8.8*   PT/INR No results for input(s): LABPROT, INR in the last 72 hours. CMP     Component Value Date/Time   NA 133 (L) 02/13/2024 0713   NA 139 09/29/2023 1207   K 4.3 02/13/2024 0713   CL 100 02/13/2024 0713   CO2 24 02/13/2024 0713   GLUCOSE 165 (H) 02/13/2024 0713   BUN 26 (H) 02/13/2024 0713   BUN 19 09/29/2023 1207   CREATININE 0.83 02/13/2024 0713   CALCIUM  8.8 (L) 02/13/2024 0713   PROT 6.6 02/12/2024 1420   ALBUMIN 3.1 (L) 02/12/2024 1420   AST 26 02/12/2024 1420   ALT 22  02/12/2024 1420   ALKPHOS 82 02/12/2024 1420   BILITOT 0.7 02/12/2024 1420   GFRNONAA >60 02/13/2024 0713   GFRAA >60 12/10/2017 2232   Lipase     Component Value Date/Time   LIPASE 40 12/10/2017 2232    Studies/Results: CT ANGIO NECK W OR WO CONTRAST Addendum Date: 02/13/2024 ADDENDUM REPORT: 02/13/2024 00:12 ADDENDUM: Findings discussed with NP J. Blondie via telephone at 12:03 a.m. Electronically Signed   By: Gilmore GORMAN Molt M.D.   On: 02/13/2024 00:12   Result Date: 02/13/2024 CLINICAL DATA:  R first rib fracture EXAM: CT ANGIOGRAPHY NECK TECHNIQUE: Multidetector CT imaging of the neck was performed using the standard protocol during bolus administration of intravenous contrast. Multiplanar CT image reconstructions and MIPs were obtained to evaluate the vascular anatomy. Carotid stenosis measurements (when applicable) are obtained utilizing NASCET criteria, using the distal internal carotid diameter as the denominator. RADIATION DOSE REDUCTION: This exam was performed according to the departmental dose-optimization program which includes automated exposure control, adjustment of the mA and/or kV according to patient size and/or use of iterative reconstruction technique. CONTRAST:  75mL OMNIPAQUE  IOHEXOL  350 MG/ML SOLN COMPARISON:  CT cervical spine and CT of the chest/abdomen/pelvis on 02/10/2024. FINDINGS: Mildly motion limited evaluation in  the lower neck. This limitation: Aortic arch: Great vessel origins are patent without significant stenosis or evidence of dissection. Right carotid system: No evidence of dissection, stenosis (50% or greater) or occlusion. Left carotid system: No evidence of dissection, stenosis (50% or greater) or occlusion. Vertebral arteries: Codominant. No evidence of dissection, stenosis (50% or greater) or occlusion. Skeleton: Known right clavicular fracture and right first rib fractures, also characterized on recent CT of the chest/abdomen/pelvis. Please see recent  CT of the cervical spine for evaluation of the cervical spine. Other neck: No evidence of acute abnormality on limited assessment. Upper chest: Small right apical pneumothorax. Other: Attempting to contact the on call provider at the time of dictation for call report. Will addend when reached. IMPRESSION: 1. Interval development of a small right apical pneumothorax. 2. No evidence of acute arterial injury or significant stenosis in the neck. 3. Known right clavicular and right first rib fractures, as characterized on recent CT of the chest/abdomen/pelvis. Electronically Signed: By: Gilmore GORMAN Molt M.D. On: 02/12/2024 23:29    Anti-infectives: Anti-infectives (From admission, onward)    None        Assessment/Plan Fall down stairs R clavicle fracture - per ortho, Dr. Kendal, non-op, NWB RUE, sling. F/u 2 weeks. PT/OT R 1st rib fracture, and 3-6 rib fractures - multimodal pain control, IS, pulm toilet, recommend CTA given 1st rib fracture - done this AM, no vascular injury.  R apical PTX - not present on initial CT 8/22. Identified on CTA today. CXR pending. No role for chest tube at present. Continue multimodal pain control and aggressive IS/flutter valve.    FEN: reg diet VTE: changed LMWH to 30 mg BID per trauma guidelines. ID: no current abx Fiebelkorn: required I/O overnight. Bladder scan prn. No hgb or RBC on UA. Defer to primary. AKI resolved Dispo: PT/OT. Trauma will follow   - per TRH -  HTN HLD T2DM Cardiac murmur DOE Obesity class II - BMI 36.0  Hx of cervical/vulvar cancer  Depression/anxiety  I reviewed nursing notes, Consultant (Ortho) notes, hospitalist notes, last 24 h vitals and pain scores, last 48 h intake and output, last 24 h labs and trends, and last 24 h imaging results.    LOS: 1 day    Almarie GORMAN Pringle, [facility] 02/13/2024, 10:40 AM Please see Amion for pager number during day hours 7:00am-4:30pm

## 2024-02-13 NOTE — Progress Notes (Signed)
 PROGRESS NOTE    Patricia Delacruz  FMW:[id number] DOB: February 24, 1956 DOA: 02/10/2024 PCP: Valma Carwin, MD  Chief Complaint  Patient presents with   Fall    Brief Narrative:   Patricia Delacruz is Patricia Delacruz 68 y.o. female with medical history significant of anxiety, depression, CIN-2, condyloma acuminata, depression, hyperlipidemia, hypertension, fibroid, type 2 diabetes, class II obesity who had Patricia Delacruz mechanical fall at home going down the stairs developing pain in her right posterior scalp, right shoulder and right chest wall.  Denied any prodromal symptoms. He denied fever, chills, rhinorrhea, sore throat, wheezing or hemoptysis.  No chest pain, palpitations, diaphoresis, PND, orthopnea or pitting edema of the lower extremities.  No abdominal pain, nausea, emesis, diarrhea, constipation, melena or hematochezia.  No flank pain, dysuria, frequency or hematuria.  No polyuria, polydipsia, polyphagia or blurred vision.    Lab work: CBC 0 white count of 14.9, hemoglobin 11.7 grams per deciliter and platelets 225.  CMP showed Patricia Delacruz glucose of albumin 3.0 g/dL, 768 and BUN of 25 mg/dL.  The rest of the CMP measurements were normal after calcium  was corrected.   Imaging: Right shoulder x-ray negative.  2 view chest radiograph with right 2nd through 5th rib fractures.  No effusion or pneumothorax.  There was vascular congestion.  CT head without contrast with no acute intercranial normality.  There was right posterior scalp hematoma.  No underlying calvarial Patricia Delacruz.  CT cervical spine with no vertebral acute fracture.  Acute mildly displaced fracture of the posterior right first rib.  Partially visualized mildly displaced fracture of the right mid clavicle.  CT chest/abdomen/pelvis with contrast show acute fracture of the mid right clavicle.  There were acute fractures posterior right first and 3rd through 6th ribs.  Suspect nondisplaced fracture of the posterior right second rib.  No pneumothorax or pleural effusion.  No evidence for  acute traumatic injury in the abdomen or pelvis.  Small hiatal hernia.  Aortic atherosclerosis.  CT thoracic spine showing osteopenia and degenerative change.  Rib fractures as above.  There was cardiomegaly with CAD.  Chronic mild anterior wedging of the L1 vertebral body.  Multilevel DDD and joint changes with spinal stenosis L3-4, L4-5 and L5-S1.  Foraminal stenosis is greatest at L5-S1.   ED course: Initial vital signs were temperature 97.7 F, pulse 73, respiration 16, blood pressure 145/96 mmHg O2 sat 96% on room air.  Patient was given supplemental oxygen, fentanyl  50 mcg IVP x 1, morphine  4 mg IVP x 1 and ondansetron  4 mg IVP x 1.  I added ketorolac  15 mg IVP x 1.    Assessment & Plan:   Principal Problem:   Fracture of multiple ribs Active Problems:   Depression   Anxiety   Hyperlipidemia   Hypertension   Obesity (BMI 35.0-39.9 without comorbidity)   Clavicle fracture   Fall   Type 2 diabetes mellitus with hyperglycemia (HCC)   Grade I diastolic dysfunction   Rib fractures  Right Mid Clavicle Fracture Right 1st Rib Fracture  Right 3rd-6th Rib Fracture.  Nondisplaced Fracture Posterior R 2nd Rib Mechanical Fall Appreciate ortho - Non op management of clavicle fracture - NWB to RUE, sling immobilization, ok for wrist and elbow motion as tolerated.  Ok to start gentle pendulum exercises of the shoulder.   Follow up with Dr. Kendal 2 weeks after discharge for repeat x rays. Appreciate surgery - CTA without evdience of acute arterial injury and significant stenosis in neck, known clavicular and first rib fx - pain management,  IS, pulm toilet  R apical PTX Noted on CT Repeat x ray without PTX noted   AKI Improved today, will continue to trend  Orthostatic Hypotension C/o LH/nausea with standing  Hold ramipril  Continue IVF for now  Hypertension Ramipril  on hold with above  HFpEF Apparently didn't tolerate beta blocker Holding ramipril  Echo with EF 60-65% - mild AS in  May 2025  Depression  Anxiety Wellbutrin , cymbalta , ativan  prn, ambien  nightly T2DM  Pioglitazone   A1c 6.7   GERD PPI  Urinary Retention [place], when she's more mobile can do TOV  Obesity Body mass index is 32.29 kg/m.    DVT prophylaxis: lovenox  30 mg BID per trauma guidelines Code Status: full Family Communication: family at bedside Disposition:   Status is: Observation The patient remains OBS appropriate and will d/c before 2 midnights.   Consultants:  Surgery ortho  Procedures:  none  Antimicrobials:  Anti-infectives (From admission, onward)    None       Subjective: Feels Patricia Delacruz little better than yesterday, still 6/10 pain  Objective: Vitals:   02/12/24 2100 02/13/24 0439 02/13/24 0500 02/13/24 0937  BP: (!) 152/70 (!) 140/75  133/63  Pulse: 100 93  89  Resp: 16 16  17   Temp: 98.2 F (36.8 C) 98.6 F (37 C)  98.4 F (36.9 C)  TempSrc: Oral   Oral  SpO2: 97% 90%  92%  Weight:   108 kg   Height:        Intake/Output Summary (Last 24 hours) at 02/13/2024 1321 Last data filed at 02/13/2024 1200 Gross per 24 hour  Intake 240 ml  Output 800 ml  Net -560 ml   Filed Weights   02/11/24 1600 02/12/24 0500 02/13/24 0500  Weight: 107.6 kg 108.6 kg 108 kg    Examination:  General: No acute distress. Sitting up in chair. Cardiovascular: RRR Lungs: Clear to auscultation bilaterally Abdomen: Soft, nontender, nondistended Neurological: Alert and oriented 3. Moves all extremities 4 with equal strength. Cranial nerves II through XII grossly intact. Skin: Warm and dry. No rashes or lesions. Extremities: No clubbing or cyanosis. No edema. R arm sling Data Reviewed: I have personally reviewed following labs and imaging studies  CBC: Recent Labs  Lab 02/10/24 0338 02/11/24 1438 02/12/24 1455 02/13/24 0713  WBC 14.9* 11.6* 11.3* 11.5*  NEUTROABS 12.5* 8.5* 8.7*  --   HGB 11.7* 11.6* 11.5* 11.3*  HCT 38.9 37.2 36.2 36.7  MCV 85.7 84.5  82.1 83.4  PLT 225 264 254 272    Basic Metabolic Panel: Recent Labs  Lab 02/10/24 0338 02/11/24 1438 02/12/24 1420 02/13/24 0713  NA 135 133* 134* 133*  K 3.5 3.9 5.0 4.3  CL 100 96* 97* 100  CO2 25 24 21* 24  GLUCOSE 231* 152* 139* 165*  BUN 25* 39* 32* 26*  CREATININE 0.82 2.15* 1.19* 0.83  CALCIUM  8.4* 9.0 9.1 8.8*  MG  --   --   --  2.1  PHOS  --   --   --  3.3    GFR: Estimated Creatinine Clearance: 91.7 mL/min (by C-G formula based on SCr of 0.83 mg/dL).  Liver Function Tests: Recent Labs  Lab 02/10/24 0338 02/11/24 1438 02/12/24 1420  AST 19 32 26  ALT 20 27 22   ALKPHOS 66 74 82  BILITOT 0.3 0.7 0.7  PROT 6.7 6.3* 6.6  ALBUMIN 3.0* 3.3* 3.1*    CBG: Recent Labs  Lab 02/12/24 1152 02/12/24 1707 02/12/24 2103 02/13/24 0936 02/13/24  1156  GLUCAP 143* 177* 168* 187* 220*     No results found for this or any previous visit (from the past 240 hours).       Radiology Studies: DG CHEST PORT 1 VIEW Result Date: 02/13/2024 CLINICAL DATA:  Pneumothorax EXAM: PORTABLE CHEST 1 VIEW COMPARISON:  02/10/2024 FINDINGS: Multiple right rib fractures and mid right clavicle fracture again noted. No visible pneumothorax. Linear left base atelectasis. Otherwise no confluent airspace opacities or effusions. IMPRESSION: Multiple right rib fractures and mid right clavicle fracture. No pneumothorax. Left base atelectasis. Electronically Signed   By: Franky Crease M.D.   On: 02/13/2024 12:30   CT ANGIO NECK W OR WO CONTRAST Addendum Date: 02/13/2024 ADDENDUM REPORT: 02/13/2024 00:12 ADDENDUM: Findings discussed with NP Patricia Delacruz via telephone at 12:03 Patricia [email or web address].m. Electronically Signed   By: Gilmore GORMAN Molt M.D.   On: 02/13/2024 00:12   Result Date: 02/13/2024 CLINICAL DATA:  R first rib fracture EXAM: CT ANGIOGRAPHY NECK TECHNIQUE: Multidetector CT imaging of the neck was performed using the standard protocol during bolus administration of intravenous contrast. Multiplanar CT  image reconstructions and MIPs were obtained to evaluate the vascular anatomy. Carotid stenosis measurements (when applicable) are obtained utilizing NASCET criteria, using the distal internal carotid diameter as the denominator. RADIATION DOSE REDUCTION: This exam was performed according to the departmental dose-optimization program which includes automated exposure control, adjustment of the mA and/or kV according to patient size and/or use of iterative reconstruction technique. CONTRAST:  75mL OMNIPAQUE  IOHEXOL  350 MG/ML SOLN COMPARISON:  CT cervical spine and CT of the chest/abdomen/pelvis on 02/10/2024. FINDINGS: Mildly motion limited evaluation in the lower neck. This limitation: Aortic arch: Great vessel origins are patent without significant stenosis or evidence of dissection. Right carotid system: No evidence of dissection, stenosis (50% or greater) or occlusion. Left carotid system: No evidence of dissection, stenosis (50% or greater) or occlusion. Vertebral arteries: Codominant. No evidence of dissection, stenosis (50% or greater) or occlusion. Skeleton: Known right clavicular fracture and right first rib fractures, also characterized on recent CT of the chest/abdomen/pelvis. Please see recent CT of the cervical spine for evaluation of the cervical spine. Other neck: No evidence of acute abnormality on limited assessment. Upper chest: Small right apical pneumothorax. Other: Attempting to contact the on call provider at the time of dictation for call report. Will addend when reached. IMPRESSION: 1. Interval development of Patricia Delacruz small right apical pneumothorax. 2. No evidence of acute arterial injury or significant stenosis in the neck. 3. Known right clavicular and right first rib fractures, as characterized on recent CT of the chest/abdomen/pelvis. Electronically Signed: By: Gilmore GORMAN Molt M.D. On: 02/12/2024 23:29         Scheduled Meds:  acetaminophen   1,000 mg Oral Q6H   buPROPion   450 mg Oral  Daily   Chlorhexidine  Gluconate Cloth  6 each Topical Daily   DULoxetine   30 mg Oral Daily   enoxaparin  (LOVENOX ) injection  30 mg Subcutaneous Q12H   gabapentin   100 mg Oral TID   insulin  aspart  0-9 Units Subcutaneous TID WC   lidocaine   2 patch Transdermal Q24H   methocarbamol   1,000 mg Oral TID   pantoprazole  (PROTONIX ) IV  40 mg Intravenous Q24H   pioglitazone   30 mg Oral Daily   polyethylene glycol  17 g Oral BID   Continuous Infusions:     LOS: 1 day    Time spent: over 30 min     Meliton Monte, MD [facility]  To contact the attending provider between 7A-7P or the covering provider during after hours 7P-7A, please log into the web site [email or web address] and access using universal [place] password for that web site. If you do not have the password, please call the hospital operator.  02/13/2024, 1:21 PM

## 2024-02-14 ENCOUNTER — Other Ambulatory Visit (HOSPITAL_COMMUNITY): Payer: Self-pay

## 2024-02-14 DIAGNOSIS — S2241XA Multiple fractures of ribs, right side, initial encounter for closed fracture: Secondary | ICD-10-CM | POA: Diagnosis not present

## 2024-02-14 LAB — GLUCOSE, CAPILLARY
Glucose-Capillary: 155 mg/dL — ABNORMAL HIGH (ref 70–99)
Glucose-Capillary: 143 mg/dL — ABNORMAL HIGH (ref 70–99)
Glucose-Capillary: 178 mg/dL — ABNORMAL HIGH (ref 70–99)
Glucose-Capillary: 176 mg/dL — ABNORMAL HIGH (ref 70–99)

## 2024-02-14 MED ORDER — PANTOPRAZOLE SODIUM 40 MG PO TBEC
40.0000 mg | DELAYED_RELEASE_TABLET | Freq: Every day | ORAL | Status: DC
  Administered 2024-02-14 – 2024-02-21 (×8): 40 mg via ORAL
  Filled 2024-02-14 (×8): qty 1

## 2024-02-14 MED ORDER — ACETAMINOPHEN 500 MG PO TABS: 1000.0000 mg | ORAL_TABLET | Freq: Four times a day (QID) | ORAL | Status: AC | PRN

## 2024-02-14 MED ORDER — OXYCODONE HCL 10 MG PO TABS
10.0000 mg | ORAL_TABLET | Freq: Four times a day (QID) | ORAL | 0 refills | Status: DC | PRN
  Filled 2024-02-14: qty 25, 7d supply, fill #0

## 2024-02-14 MED ORDER — METHOCARBAMOL 500 MG PO TABS
1000.0000 mg | ORAL_TABLET | Freq: Three times a day (TID) | ORAL | 0 refills | Status: DC | PRN
  Filled 2024-02-14: qty 100, 17d supply, fill #0

## 2024-02-14 MED ORDER — OXYCODONE HCL 10 MG PO TABS: 10.0000 mg | ORAL_TABLET | Freq: Four times a day (QID) | ORAL | 0 refills | Status: DC | PRN

## 2024-02-14 MED ORDER — LIDOCAINE 5 % EX PTCH
2.0000 | MEDICATED_PATCH | CUTANEOUS | 0 refills | Status: AC
  Filled 2024-02-14: qty 14, 7d supply, fill #0

## 2024-02-14 MED ORDER — MAGNESIUM CITRATE PO SOLN
0.5000 | Freq: Once | ORAL | Status: AC | PRN
  Administered 2024-02-14: 0.5 via ORAL
  Filled 2024-02-14: qty 296

## 2024-02-14 MED ORDER — OXYCODONE HCL 5 MG PO TABS
10.0000 mg | ORAL_TABLET | ORAL | Status: DC | PRN
  Administered 2024-02-15 – 2024-02-20 (×3): 10 mg via ORAL
  Filled 2024-02-14 (×3): qty 2

## 2024-02-14 MED ORDER — OXYCODONE HCL 5 MG PO TABS
5.0000 mg | ORAL_TABLET | ORAL | Status: DC | PRN
  Administered 2024-02-14 – 2024-02-20 (×14): 5 mg via ORAL
  Filled 2024-02-14 (×13): qty 1

## 2024-02-14 MED ORDER — BISACODYL 10 MG RE SUPP
10.0000 mg | Freq: Once | RECTAL | Status: AC
  Administered 2024-02-14: 10 mg via RECTAL
  Filled 2024-02-14: qty 1

## 2024-02-14 NOTE — Progress Notes (Signed)
 Mobility Specialist Progress Note:   02/14/24 1122  Mobility  Activity Pivoted/transferred from bed to chair  Level of Assistance Maximum assist, patient does 25-49%  Assistive Device Front wheel walker  Distance Ambulated (ft) 5 ft  RUE Weight Bearing Per Provider Order NWB  Activity Response Tolerated well  Mobility Referral Yes  Mobility visit 1 Mobility  Mobility Specialist Start Time (ACUTE ONLY) [id number]  Mobility Specialist Stop Time (ACUTE ONLY) 0919  Mobility Specialist Time Calculation (min) (ACUTE ONLY) 10 min   Received pt in bed and agreeable to mobility. Pt required MaxA to EOB and ModA +2 STS. Pt had no c/o. Pt took steps towards chair. Pt left in chair with personal belongings and call light within reach. All needs met.  Lavanda Pollack Mobility Specialist  Please contact via [employer] or  Rehab Office [phone]

## 2024-02-14 NOTE — Progress Notes (Addendum)
 Patient appears weaker than when she first came in.  Loses balance when walking with RW, reports her legs feel very weak, Powell MD notified. Transfer states 1 person assist but now is requiring 2 person assist .

## 2024-02-14 NOTE — Plan of Care (Signed)
  Problem: Coping: Goal: Ability to adjust to condition or change in health will improve Outcome: Progressing   Problem: Health Behavior/Discharge Planning: Goal: Ability to manage health-related needs will improve Outcome: Progressing   Problem: Activity: Goal: Risk for activity intolerance will decrease Outcome: Progressing

## 2024-02-14 NOTE — TOC Benefit Eligibility Note (Signed)
 Pharmacy Patient Advocate Encounter   Received notification from Inpatient Request that prior authorization for Lidocaine  5% patchs is required/requested.   Insurance verification completed.   The patient is insured through [facility] .   Per test claim: PA required; PA submitted to above mentioned insurance via Latent Key/confirmation #/EOC [facility] Status is pending    Received notification from [facility] that Prior Authorization for Lidocaine  5% patches has been DENIED.  See denial reason below. No denial letter attached in CMM. Will attach denial letter to Media tab once received.   PA #/Case ID/Reference #: [id number]

## 2024-02-14 NOTE — Progress Notes (Signed)
 LBM 8/20 Suppository inserted, passing gas but no BM.

## 2024-02-14 NOTE — Progress Notes (Signed)
 PROGRESS NOTE    Patricia Delacruz  FMW:[id number] DOB: [birth year] DOA: 02/10/2024 PCP: Valma Carwin, MD  Chief Complaint  Patient presents with   Fall    Brief Narrative:   Patricia Delacruz is Patricia Delacruz 68 y.o. female with medical history significant of anxiety, depression, depression, hyperlipidemia, hypertension, type 2 diabetes, obesity who had Teresea Donley mechanical fall at home going down the stairs.  CT showed right 1st rib fracture, right 3rd-6th rib fracture, nondisplaced fracture posterior r 2nd rib, and R clavicular fracture.  Admitted to hospitalist service with trauma surgery following.  Orthopedics recommended NWB to RUE, sling immobilization.  Trauma has now signed off.  Currently working on pain control.  Therapy reevaluating given her deconditioning, concern is that she'll need SNF placement.     Assessment & Plan:   Principal Problem:   Fracture of multiple ribs Active Problems:   Depression   Anxiety   Hyperlipidemia   Hypertension   Obesity (BMI 35.0-39.9 without comorbidity)   Clavicle fracture   Fall   Type 2 diabetes mellitus with hyperglycemia (HCC)   Grade I diastolic dysfunction   Rib fractures  Right Mid Clavicle Fracture Right 1st Rib Fracture  Right 3rd-6th Rib Fracture.  Nondisplaced Fracture Posterior R 2nd Rib Mechanical Fall Appreciate ortho - Non op management of clavicle fracture - NWB to RUE, sling immobilization, ok for wrist and elbow motion as tolerated.  Ok to start gentle pendulum exercises of the shoulder.   Follow up with Dr. Kendal 2 weeks after discharge for repeat x rays. 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  Trauma now signed off - follow with Dr. Kendal in 2 weeks and with PCP in 1-2 weeks for post hospital follow up.   R apical PTX Noted on CT Repeat x ray without PTX noted   AKI Improved today, will continue to  trend  Orthostatic Hypotension C/o LH/nausea with standing on 8/23 Seems improved today Hold ramipril  Continue to follow orthostatics and symptoms  Physical Deconditioning Concern that patient will need more than home health, therapy reeval requested  Hypertension Ramipril  on hold with above  HFpEF Apparently didn't tolerate beta blocker Holding ramipril  Echo with EF 60-65% - mild AS in May 2025  Depression  Anxiety Wellbutrin , cymbalta , ativan  prn, ambien  nightly  T2DM  Pioglitazone   A1c 6.7   GERD PPI  Urinary Retention With indwelling Mangan in place when she's more mobile can do TOV  Obesity Body mass index is 34.15 kg/m.    DVT prophylaxis: lovenox  30 mg BID per trauma guidelines Code Status: full Family Communication: family at bedside Disposition:   Status is: Observation The patient remains OBS appropriate and will d/c before 2 midnights.   Consultants:  Surgery ortho  Procedures:  none  Antimicrobials:  Anti-infectives (From admission, onward)    None       Subjective: No new complaints Sig other and daughter at bedside  Objective: Vitals:   02/14/24 0439 02/14/24 0500 02/14/24 0751 02/14/24 0752  BP: 133/69  118/65   Pulse: 87  84   Resp: 18  17   Temp: 98.2 F (36.8 C)  97.6 F (36.4 C)   TempSrc: Oral  Oral   SpO2: 94%  (!) 88% 92%  Weight:  114.2 kg    Height:        Intake/Output Summary (Last 24 hours) at 02/14/2024 1526 Last data  filed at 02/14/2024 1130 Gross per 24 hour  Intake 200 ml  Output 800 ml  Net -600 ml   Filed Weights   02/12/24 0500 02/13/24 0500 02/14/24 0500  Weight: 108.6 kg 108 kg 114.2 kg    Examination:  General: No acute distress. Cardiovascular: RRR Lungs: unlabored Abdomen: Soft, nontender, nondistended  Neurological: Alert and oriented 3. Mild tremor.  Extremities: No clubbing or cyanosis. No edema.  R arm in sling  Data Reviewed: I have personally reviewed following labs and  imaging studies  CBC: Recent Labs  Lab 02/10/24 0338 02/11/24 1438 02/12/24 1455 02/13/24 0713  WBC 14.9* 11.6* 11.3* 11.5*  NEUTROABS 12.5* 8.5* 8.7*  --   HGB 11.7* 11.6* 11.5* 11.3*  HCT 38.9 37.2 36.2 36.7  MCV 85.7 84.5 82.1 83.4  PLT 225 264 254 272    Basic Metabolic Panel: Recent Labs  Lab 02/10/24 0338 02/11/24 1438 02/12/24 1420 02/13/24 0713  NA 135 133* 134* 133*  K 3.5 3.9 5.0 4.3  CL 100 96* 97* 100  CO2 25 24 21* 24  GLUCOSE 231* 152* 139* 165*  BUN 25* 39* 32* 26*  CREATININE 0.82 2.15* 1.19* 0.83  CALCIUM  8.4* 9.0 9.1 8.8*  MG  --   --   --  2.1  PHOS  --   --   --  3.3    GFR: Estimated Creatinine Clearance: 94.2 mL/min (by C-G formula based on SCr of 0.83 mg/dL).  Liver Function Tests: Recent Labs  Lab 02/10/24 0338 02/11/24 1438 02/12/24 1420  AST 19 32 26  ALT 20 27 22   ALKPHOS 66 74 82  BILITOT 0.3 0.7 0.7  PROT 6.7 6.3* 6.6  ALBUMIN 3.0* 3.3* 3.1*    CBG: Recent Labs  Lab 02/13/24 1156 02/13/24 1750 02/13/24 2010 02/14/24 0753 02/14/24 1211  GLUCAP 220* 134* 165* 155* 143*     No results found for this or any previous visit (from the past 240 hours).       Radiology Studies: DG CHEST PORT 1 VIEW Result Date: 02/13/2024 CLINICAL DATA:  Pneumothorax EXAM: PORTABLE CHEST 1 VIEW COMPARISON:  02/10/2024 FINDINGS: Multiple right rib fractures and mid right clavicle fracture again noted. No visible pneumothorax. Linear left base atelectasis. Otherwise no confluent airspace opacities or effusions. IMPRESSION: Multiple right rib fractures and mid right clavicle fracture. No pneumothorax. Left base atelectasis. Electronically Signed   By: Franky Crease M.D.   On: 02/13/2024 12:30   CT ANGIO NECK W OR WO CONTRAST Addendum Date: 02/13/2024 ADDENDUM REPORT: 02/13/2024 00:12 ADDENDUM: Findings discussed with NP J. Blondie via telephone at 12:03 Enas Winchel.m. Electronically Signed   By: Gilmore GORMAN Molt M.D.   On: 02/13/2024 00:12   Result  Date: 02/13/2024 CLINICAL DATA:  R first rib fracture EXAM: CT ANGIOGRAPHY NECK TECHNIQUE: Multidetector CT imaging of the neck was performed using the standard protocol during bolus administration of intravenous contrast. Multiplanar CT image reconstructions and MIPs were obtained to evaluate the vascular anatomy. Carotid stenosis measurements (when applicable) are obtained utilizing NASCET criteria, using the distal internal carotid diameter as the denominator. RADIATION DOSE REDUCTION: This exam was performed according to the departmental dose-optimization program which includes automated exposure control, adjustment of the mA and/or kV according to patient size and/or use of iterative reconstruction technique. CONTRAST:  75mL OMNIPAQUE  IOHEXOL  350 MG/ML SOLN COMPARISON:  CT cervical spine and CT of the chest/abdomen/pelvis on 02/10/2024. FINDINGS: Mildly motion limited evaluation in the lower neck. This limitation: Aortic arch: Great vessel  origins are patent without significant stenosis or evidence of dissection. Right carotid system: No evidence of dissection, stenosis (50% or greater) or occlusion. Left carotid system: No evidence of dissection, stenosis (50% or greater) or occlusion. Vertebral arteries: Codominant. No evidence of dissection, stenosis (50% or greater) or occlusion. Skeleton: Known right clavicular fracture and right first rib fractures, also characterized on recent CT of the chest/abdomen/pelvis. Please see recent CT of the cervical spine for evaluation of the cervical spine. Other neck: No evidence of acute abnormality on limited assessment. Upper chest: Small right apical pneumothorax. Other: Attempting to contact the on call provider at the time of dictation for call report. Will addend when reached. IMPRESSION: 1. Interval development of [place] W[place] small right apical pneumothorax. 2. No evidence of acute arterial injury or significant stenosis in the neck. 3. Known right clavicular and right first  rib fractures, as characterized on recent CT of the chest/abdomen/pelvis. Electronically Signed: By: Gilmore GORMAN Molt M.D. On: 02/12/2024 23:29         Scheduled Meds:  acetaminophen   1,000 mg Oral Q6H   buPROPion   450 mg Oral Daily   Chlorhexidine  Gluconate Cloth  6 each Topical Daily   DULoxetine   30 mg Oral Daily   enoxaparin  (LOVENOX ) injection  30 mg Subcutaneous Q12H   gabapentin   100 mg Oral TID   insulin  aspart  0-9 Units Subcutaneous TID WC   lidocaine   2 patch Transdermal Q24H   methocarbamol   1,000 mg Oral TID   pantoprazole   40 mg Oral Daily   pioglitazone   30 mg Oral Daily   polyethylene glycol  17 g Oral BID   Continuous Infusions:     LOS: 2 days    Time spent: over 30 min     Meliton Monte, MD [facility]   To contact the attending provider between 7A-7P or the covering provider during after hours 7P-7A, please log into the web site [email or web address] and access using universal [place] password for that web site. If you do not have the password, please call the hospital operator.  02/14/2024, 3:26 PM

## 2024-02-14 NOTE — Plan of Care (Signed)
  Problem: Education: Goal: Ability to describe self-care measures that may prevent or decrease complications (Diabetes Survival Skills Education) will improve Outcome: Progressing Goal: Individualized Educational Video(s) Outcome: Progressing   Problem: Education: Goal: Ability to describe self-care measures that may prevent or decrease complications (Diabetes Survival Skills Education) will improve Outcome: Progressing   Problem: Coping: Goal: Ability to adjust to condition or change in health will improve Outcome: Progressing

## 2024-02-14 NOTE — Progress Notes (Signed)
 Subjective: CC: Pain overall improved - most severe when trying to get OOB. Requires help getting up but once OOB was able to walk 80 feet with walker. +flatus. Tolerating PO. Last BM 6 days ago. Deyoe in place for retention.   Objective: Vital signs in last 24 hours: Temp:  [97.6 F (36.4 C)-98.4 F (36.9 C)] 97.6 F (36.4 C) (08/26 0751) Pulse Rate:  [84-93] 84 (08/26 0751) Resp:  [17-18] 17 (08/26 0751) BP: (118-145)/(63-78) 118/65 (08/26 0751) SpO2:  [88 %-94 %] 92 % (08/26 0752) Weight:  [114.2 kg] 114.2 kg (08/26 0500) Last BM Date : 02/09/24  Intake/Output from previous day: 08/25 0701 - 08/26 0700 In: 600 [P.O.:600] Out: 600 [Urine:600] Intake/Output this shift: No intake/output data recorded.  PE: Gen:  Alert, NAD, pleasant HEENT: EOM's intact, pupils equal and round Card:  RRR. Radial and DP 2+ b/l. Pulm:  effort normal ORA Abd: Soft, ND, NT Ext:  RUE in sling, fingers WWP Psych: A&Ox3  Neuro:. Non-focal Skin: R knee abrasion. Otherwise warm and dry  Lab Results:  Recent Labs    02/12/24 1455 02/13/24 0713  WBC 11.3* 11.5*  HGB 11.5* 11.3*  HCT 36.2 36.7  PLT 254 272   BMET Recent Labs    02/12/24 1420 02/13/24 0713  NA 134* 133*  K 5.0 4.3  CL 97* 100  CO2 21* 24  GLUCOSE 139* 165*  BUN 32* 26*  CREATININE 1.19* 0.83  CALCIUM  9.1 8.8*   PT/INR No results for input(s): LABPROT, INR in the last 72 hours. CMP     Component Value Date/Time   NA 133 (L) 02/13/2024 0713   NA 139 09/29/2023 1207   K 4.3 02/13/2024 0713   CL 100 02/13/2024 0713   CO2 24 02/13/2024 0713   GLUCOSE 165 (H) 02/13/2024 0713   BUN 26 (H) 02/13/2024 0713   BUN 19 09/29/2023 1207   CREATININE 0.83 02/13/2024 0713   CALCIUM  8.8 (L) 02/13/2024 0713   PROT 6.6 02/12/2024 1420   ALBUMIN 3.1 (L) 02/12/2024 1420   AST 26 02/12/2024 1420   ALT 22 02/12/2024 1420   ALKPHOS 82 02/12/2024 1420   BILITOT 0.7 02/12/2024 1420   GFRNONAA >60 02/13/2024 0713    GFRAA >60 12/10/2017 2232   Lipase     Component Value Date/Time   LIPASE 40 12/10/2017 2232    Studies/Results: DG CHEST PORT 1 VIEW Result Date: 02/13/2024 CLINICAL DATA:  Pneumothorax EXAM: PORTABLE CHEST 1 VIEW COMPARISON:  02/10/2024 FINDINGS: Multiple right rib fractures and mid right clavicle fracture again noted. No visible pneumothorax. Linear left base atelectasis. Otherwise no confluent airspace opacities or effusions. IMPRESSION: Multiple right rib fractures and mid right clavicle fracture. No pneumothorax. Left base atelectasis. Electronically Signed   By: Franky Crease M.D.   On: 02/13/2024 12:30   CT ANGIO NECK W OR WO CONTRAST Addendum Date: 02/13/2024 ADDENDUM REPORT: 02/13/2024 00:12 ADDENDUM: Findings discussed with NP J. Blondie via telephone at 12:03 a.m. Electronically Signed   By: Gilmore GORMAN Molt M.D.   On: 02/13/2024 00:12   Result Date: 02/13/2024 CLINICAL DATA:  R first rib fracture EXAM: CT ANGIOGRAPHY NECK TECHNIQUE: Multidetector CT imaging of the neck was performed using the standard protocol during bolus administration of intravenous contrast. Multiplanar CT image reconstructions and MIPs were obtained to evaluate the vascular anatomy. Carotid stenosis measurements (when applicable) are obtained utilizing NASCET criteria, using the distal internal carotid diameter as the denominator. RADIATION DOSE REDUCTION:  This exam was performed according to the departmental dose-optimization program which includes automated exposure control, adjustment of the mA and/or kV according to patient size and/or use of iterative reconstruction technique. CONTRAST:  75mL OMNIPAQUE  IOHEXOL  350 MG/ML SOLN COMPARISON:  CT cervical spine and CT of the chest/abdomen/pelvis on 02/10/2024. FINDINGS: Mildly motion limited evaluation in the lower neck. This limitation: Aortic arch: Great vessel origins are patent without significant stenosis or evidence of dissection. Right carotid system: No  evidence of dissection, stenosis (50% or greater) or occlusion. Left carotid system: No evidence of dissection, stenosis (50% or greater) or occlusion. Vertebral arteries: Codominant. No evidence of dissection, stenosis (50% or greater) or occlusion. Skeleton: Known right clavicular fracture and right first rib fractures, also characterized on recent CT of the chest/abdomen/pelvis. Please see recent CT of the cervical spine for evaluation of the cervical spine. Other neck: No evidence of acute abnormality on limited assessment. Upper chest: Small right apical pneumothorax. Other: Attempting to contact the on call provider at the time of dictation for call report. Will addend when reached. IMPRESSION: 1. Interval development of a small right apical pneumothorax. 2. No evidence of acute arterial injury or significant stenosis in the neck. 3. Known right clavicular and right first rib fractures, as characterized on recent CT of the chest/abdomen/pelvis. Electronically Signed: By: Gilmore GORMAN Molt M.D. On: 02/12/2024 23:29    Anti-infectives: Anti-infectives (From admission, onward)    None        Assessment/Plan Fall down stairs R clavicle fracture - per ortho, Dr. Kendal, non-op, NWB RUE, sling. F/u 2 weeks. PT/OT R 1st rib fracture, and 3-6 rib fractures - multimodal pain control, IS, pulm toilet. CTA negative for vascular injury.  R apical PTX - not present on initial CT 8/22. Identified on CTA 8/25. Follow up CXR with no visible PTX. Maintaining O2 sats ORA, including during mobility. Continue multimodal pain control and aggressive IS/flutter valve.    FEN: reg diet VTE: changed LMWH to 30 mg BID per trauma guidelines. ID: no current abx Schlender: in place; defer TOV to primary team Dispo: PT/OT. Trauma will sign off. Patient may follow up with Dr. Kendal in 2 weeks and with PCP in 1-2 weeks for post-hospital follow up. Pain Rx provided.  - per TRH -  HTN HLD T2DM Cardiac  murmur DOE Obesity class II - BMI 36.0  Hx of cervical/vulvar cancer  Depression/anxiety  I reviewed nursing notes, Consultant (Ortho) notes, hospitalist notes, last 24 h vitals and pain scores, last 48 h intake and output, last 24 h labs and trends, and last 24 h imaging results.    LOS: 2 days    Almarie GORMAN Pringle, [facility] 02/14/2024, 9:18 AM Please see Amion for pager number during day hours 7:00am-4:30pm

## 2024-02-14 NOTE — Progress Notes (Signed)
 Physical Therapy Treatment Patient Details Name: Patricia Delacruz MRN: [id number] DOB: [birth year] Today's Date: 02/14/2024   History of Present Illness Patient is a 68 y/o female admitted 02/09/24 with fall down stairs at home noted R clavicle fx, R first rib, and 3-6 rib fx.  PMH positive for DM, CIN II (cervical intraepithelial neoplasia), h/o cervical/vulvar cancer, HTN, HLD and obesity.    PT Comments  Pt has been limited by pain in recent therapy sessions so pt premedicated prior to PT session. Pt asleep on entry but rouses easily and agreeable to work with therapy. Pt reports doing better getting in and out of bed with HoB elevated. Pt able to walk LE off bed and requires modAx2 for bringing trunk to upright. Pt is min Ax2 for power up and steadying. Pt is minAx2 for ambulation but is noted to have increasing instability in her LE with arrival at door. Pt reports she thinks it would be a good idea to go back to bed. Pt requires modAx 2 for return to bed. Given pt's decreased mobility distance in sessions and increased need for assist. Patient will benefit from continued inpatient follow up therapy, <3 hours/day prior to return home to work on LE strengthening and endurance. PT and Mobility Specialist will continue to follow acutely.      If plan is discharge home, recommend the following: A little help with walking and/or transfers;A little help with bathing/dressing/bathroom;Help with stairs or ramp for entrance;Assistance with cooking/housework;Assist for transportation   Can travel by private vehicle     No  Equipment Recommendations  Hospital bed       Precautions / Restrictions Precautions Precautions: Fall Recall of Precautions/Restrictions: Impaired Required Braces or Orthoses: Sling Restrictions Weight Bearing Restrictions Per Provider Order: Yes RUE Weight Bearing Per Provider Order: Non weight bearing Other Position/Activity Restrictions: PA from ortho approved use of rollator  for balance if tolerated without pushing weight on R hand; Okay for wrist and elbow motion as tolerated. Ok to start gentle pendulum exercises of the shoulder.     Mobility  Bed Mobility Overal bed mobility: Needs Assistance Bed Mobility: Supine to Sit, Sit to Supine     Supine to sit: HOB elevated, Used rails, +2 for physical assistance, Mod assist Sit to supine: Mod assist, +2 for physical assistance, HOB elevated, Used rails   General bed mobility comments: pt reports she does best with HoB elevated and walking her LE off bed on L side, requires modAx2 for bringing trunk to upright    Transfers Overall transfer level: Needs assistance Equipment used: Rolling walker (2 wheels) Transfers: Sit to/from Stand Sit to Stand: Min assist, +2 safety/equipment           General transfer comment: sling adjusted while sitting EoB for increased support, with increased forward leand pt requires ming Ax2 for powering up and steadying at RW.    Ambulation/Gait Ambulation/Gait assistance: Min assist, +2 safety/equipment Gait Distance (Feet): 20 Feet Assistive device: Rolling walker (2 wheels) Gait Pattern/deviations: Step-through pattern, Decreased stride length, Wide base of support Gait velocity: decreased Gait velocity interpretation: <1.31 ft/sec, indicative of household ambulator   General Gait Details: pt utilized L hand on RW and PT provided propulsion of right side of the RW. Pt ambulates to RW with very slowed pace and noted to have slight LE instability when reaching the door, pt reports she does not feel steady and suggest return to bed      Balance Overall balance assessment: Needs assistance Sitting-balance  support: Single extremity supported, Feet supported Sitting balance-Leahy Scale: Fair Sitting balance - Comments: maintained sitting EOB with supervision   Standing balance support: No upper extremity supported, During functional activity Standing balance-Leahy Scale:  Fair                              Hotel manager: No apparent difficulties  Cognition Arousal: Alert Behavior During Therapy: Flat affect   PT - Cognitive impairments: Awareness, Initiation, Sequencing, Problem solving, Safety/Judgement                       PT - Cognition Comments: pt agreable to getting up but anxious about increase in pain Following commands: Impaired Following commands impaired: Follows one step commands inconsistently, Follows one step commands with increased time    Cueing Cueing Techniques: Verbal cues, Gestural cues     General Comments General comments (skin integrity, edema, etc.): VSS on RA, husband present during session      Pertinent Vitals/Pain Pain Assessment Pain Assessment: Faces Faces Pain Scale: Hurts little more Pain Location: RUE and mid R back Pain Descriptors / Indicators: Grimacing, Guarding, Discomfort, Burning, Aching, Constant Pain Intervention(s): Premedicated before session, Limited activity within patient's tolerance, Monitored during session     PT Goals (current goals can now be found in the care plan section) Acute Rehab PT Goals Patient Stated Goal: Have less pain PT Goal Formulation: With patient Time For Goal Achievement: 02/24/24 Potential to Achieve Goals: Good Progress towards PT goals: Not progressing toward goals - comment    Frequency    Min 2X/week       AM-PAC PT 6 Clicks Mobility   Outcome Measure  Help needed turning from your back to your side while in a flat bed without using bedrails?: Total Help needed moving from lying on your back to sitting on the side of a flat bed without using bedrails?: Total Help needed moving to and from a bed to a chair (including a wheelchair)?: A Lot Help needed standing up from a chair using your arms (e.g., wheelchair or bedside chair)?: A Lot Help needed to walk in hospital room?: A Little Help needed climbing 3-5  steps with a railing? : A Lot 6 Click Score: 11    End of Session Equipment Utilized During Treatment: Gait belt;Other (comment) (sling) Activity Tolerance: Patient limited by pain Patient left: with call bell/phone within reach;in bed;with family/visitor present Nurse Communication: Mobility status PT Visit Diagnosis: Other abnormalities of gait and mobility (R26.89);History of falling (Z91.81);Pain Pain - Right/Left: Right Pain - part of body: Shoulder;Arm     Time: 1511-1530 PT Time Calculation (min) (ACUTE ONLY): 19 min  Charges:    $Gait Training: 8-22 mins PT General Charges $$ ACUTE PT VISIT: 1 Visit                     Patricia Delacruz B. Fleeta Lapidus PT, DPT Acute Rehabilitation Services Please use secure chat or  Call Office [phone]    Almarie KATHEE Fleeta [facility] 02/14/2024, 3:43 PM

## 2024-02-15 DIAGNOSIS — S2241XD Multiple fractures of ribs, right side, subsequent encounter for fracture with routine healing: Secondary | ICD-10-CM | POA: Diagnosis not present

## 2024-02-15 LAB — GLUCOSE, CAPILLARY
Glucose-Capillary: 127 mg/dL — ABNORMAL HIGH (ref 70–99)
Glucose-Capillary: 173 mg/dL — ABNORMAL HIGH (ref 70–99)
Glucose-Capillary: 86 mg/dL (ref 70–99)
Glucose-Capillary: 145 mg/dL — ABNORMAL HIGH (ref 70–99)

## 2024-02-15 MED ORDER — ONDANSETRON HCL 4 MG/2ML IJ SOLN
4.0000 mg | Freq: Four times a day (QID) | INTRAMUSCULAR | Status: DC | PRN
  Administered 2024-02-15: 4 mg via INTRAVENOUS
  Filled 2024-02-15: qty 2

## 2024-02-15 MED ORDER — GUAIFENESIN 100 MG/5ML PO LIQD: 5.0000 mL | ORAL | Status: DC | PRN

## 2024-02-15 MED ORDER — GLUCAGON HCL RDNA (DIAGNOSTIC) 1 MG IJ SOLR: 1.0000 mg | INTRAMUSCULAR | Status: DC | PRN

## 2024-02-15 MED ORDER — METOPROLOL TARTRATE 5 MG/5ML IV SOLN: 5.0000 mg | INTRAVENOUS | Status: DC | PRN

## 2024-02-15 MED ORDER — HYDRALAZINE HCL 20 MG/ML IJ SOLN: 10.0000 mg | INTRAMUSCULAR | Status: DC | PRN

## 2024-02-15 MED ORDER — ACETAMINOPHEN 325 MG PO TABS: 650.0000 mg | ORAL_TABLET | Freq: Four times a day (QID) | ORAL | Status: DC | PRN

## 2024-02-15 MED ORDER — IPRATROPIUM-ALBUTEROL 0.5-2.5 (3) MG/3ML IN SOLN: 3.0000 mL | RESPIRATORY_TRACT | Status: DC | PRN

## 2024-02-15 NOTE — Care Management Important Message (Signed)
 Important Message  Patient Details  Name: Patricia Delacruz MRN: [id number] Date of Birth: 1956/02/27   Important Message Given:  Yes - Medicare IM     Jon Cruel 02/15/2024, 4:48 PM

## 2024-02-15 NOTE — Plan of Care (Signed)
  Problem: Education: Goal: Ability to describe self-care measures that may prevent or decrease complications (Diabetes Survival Skills Education) will improve Outcome: Progressing   Problem: Pain Managment: Goal: General experience of comfort will improve and/or be controlled Outcome: Progressing   Problem: Safety: Goal: Ability to remain free from injury will improve Outcome: Progressing   Problem: Skin Integrity: Goal: Risk for impaired skin integrity will decrease Outcome: Progressing

## 2024-02-15 NOTE — TOC Progression Note (Addendum)
 Transition of Care [facility]) - Progression Note    Patient Details  Name: Patricia Delacruz MRN: [id number] Date of Birth: 03-03-1956  Transition of Care [facility] CM/SW Contact  Jonda Alanis LITTIE Moose, [place] Phone Number: 02/15/2024, 1:36 PM  Clinical Narrative:    CSW completed SNF workup with pt and her husband in pt room. Both are agreeable to DC plan. CSW completed and sent out SNF referrals. CSW will follow up with bed offers.    Expected Discharge Plan: Home w Home Health Services Barriers to Discharge: Continued Medical Work up               Expected Discharge Plan and Services   Discharge Planning Services: CM Consult Post Acute Care Choice: Home Health Living arrangements for the past 2 months: Single Family Home                 DME Arranged: N/A         HH Arranged: PT, OT HH Agency: [name] Date [facility] Agency Contacted: 02/10/24 Time HH Agency Contacted: 1507 Representative spoke with at [facility] Agency: [name]   Social Drivers of Health (SDOH) Interventions SDOH Screenings   Food Insecurity: No Food Insecurity (02/10/2024)  Housing: Low Risk  (02/11/2024)  Transportation Needs: No Transportation Needs (02/11/2024)  Utilities: Not At Risk (02/10/2024)  Social Connections: Socially Integrated (02/10/2024)  Tobacco Use: Low Risk  (02/10/2024)    Readmission Risk Interventions     No data to display

## 2024-02-15 NOTE — Plan of Care (Signed)

## 2024-02-15 NOTE — Progress Notes (Signed)
 PROGRESS NOTE    Patricia Delacruz  FMW:[id number] DOB: 1956-04-23 DOA: 02/10/2024 PCP: Valma Carwin, MD    Brief Narrative:   68 y.o. female with medical history significant of anxiety, depression, depression, hyperlipidemia, hypertension, type 2 diabetes, obesity who had a mechanical fall at home going down the stairs.   CT showed right 1st rib fracture, right 3rd-6th rib fracture, nondisplaced fracture posterior r 2nd rib, and R clavicular fracture.   Admitted to hospitalist service with trauma surgery following.  Orthopedics recommended NWB to RUE, sling immobilization.  Trauma has now signed off.  Currently working on pain control.  Therapy reevaluating given her deconditioning, concern is that she'll need SNF placement.    Assessment & Plan:  Principal Problem:   Fracture of multiple ribs Active Problems:   Depression   Anxiety   Hyperlipidemia   Hypertension   Obesity (BMI 35.0-39.9 without comorbidity)   Clavicle fracture   Fall   Type 2 diabetes mellitus with hyperglycemia (HCC)   Grade I diastolic dysfunction   Rib fractures     Right Mid Clavicle Fracture Right 1st Rib Fracture  Right 3rd-6th Rib Fracture.  Nondisplaced Fracture Posterior R 2nd Rib Mechanical Fall -Appreciate ortho - Non op management of clavicle fracture - NWB to RUE, sling immobilization, ok for wrist and elbow motion as tolerated.  Ok to start gentle pendulum exercises of the shoulder.   Follow up with Dr. Kendal 2 weeks after discharge for repeat x rays. 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  Trauma now signed off - follow with Dr. Kendal in 2 weeks and with PCP in 1-2 weeks for post hospital follow up.    R apical PTX -Noted on CT Repeat x ray without PTX noted    AKI -Resolved.   Orthostatic Hypotension C/o LH/nausea with standing on 8/23 -Home meds on hold.   Supportive care   Physical Deconditioning -Therapy evaluation  Hypertension -Ramipril  on hold with above   HFpEF, EF 65%.  Mild AS Apparently didn't tolerate beta blocker Holding ramipril  Echo with EF 60-65% - mild AS in May 2025   Depression  Anxiety -Wellbutrin , cymbalta , ativan  prn, ambien  nightly   T2DM  -On home pioglitazone  - Sliding scale   GERD -PPI   Urinary Retention -Shaub catheter in place.  Attempt Disla removal today   Obesity Body mass index is 34.15 kg/m.       DVT prophylaxis: lovenox  30 mg BID per trauma guidelines Code Status: full Family Communication: family at bedside Disposition:    Status is: Observation The patient remains OBS appropriate and will d/c before 2 midnights.     PT Follow up Recs: Skilled Nursing-Short Term Rehab (<3 Hours/Day)02/14/2024 1500  Subjective:  Patient is doing okay, sitting up in the recliner.  No complaints  Examination:  General exam: Appears calm and comfortable  Respiratory system: Clear to auscultation. Respiratory effort normal. Cardiovascular system: S1 & S2 heard, RRR. No JVD, murmurs, rubs, gallops or clicks. No pedal edema. Gastrointestinal system: Abdomen is nondistended, soft and nontender. No organomegaly or masses felt. Normal bowel sounds heard. Central nervous system: Alert and oriented. No focal neurological deficits. Extremities: Symmetric 5 x 5 power. Skin: No rashes, lesions or ulcers Psychiatry: Judgement and insight appear normal. Mood & affect appropriate.                Diet Orders (From  admission, onward)     Start     Ordered   02/10/24 1255  Diet heart healthy/carb modified Room service appropriate? Yes; Fluid consistency: Thin  Diet effective now       Question Answer Comment  Diet-HS Snack? Nothing   Room service appropriate? Yes   Fluid consistency: Thin      02/10/24 1254            Objective: Vitals:   02/14/24 2027 02/15/24 0115 02/15/24 0609  02/15/24 0750  BP: 137/71  138/70 137/75  Pulse: 91  62 88  Resp: 17  18 16   Temp: 98 F (36.7 C)  97.7 F (36.5 C) 98.1 F (36.7 C)  TempSrc: Oral  Oral Oral  SpO2: 95%  98% 91%  Weight:  113.5 kg    Height:        Intake/Output Summary (Last 24 hours) at 02/15/2024 1142 Last data filed at 02/15/2024 0616 Gross per 24 hour  Intake 240 ml  Output 700 ml  Net -460 ml   Filed Weights   02/13/24 0500 02/14/24 0500 02/15/24 0115  Weight: 108 kg 114.2 kg 113.5 kg    Scheduled Meds:  acetaminophen   1,000 mg Oral Q6H   buPROPion   450 mg Oral Daily   Chlorhexidine  Gluconate Cloth  6 each Topical Daily   DULoxetine   30 mg Oral Daily   enoxaparin  (LOVENOX ) injection  30 mg Subcutaneous Q12H   gabapentin   100 mg Oral TID   insulin  aspart  0-9 Units Subcutaneous TID WC   lidocaine   2 patch Transdermal Q24H   methocarbamol   1,000 mg Oral TID   pantoprazole   40 mg Oral Daily   pioglitazone   30 mg Oral Daily   polyethylene glycol  17 g Oral BID   Continuous Infusions:  Nutritional status     Body mass index is 33.94 kg/m.  Data Reviewed:   CBC: Recent Labs  Lab 02/10/24 0338 02/11/24 1438 02/12/24 1455 02/13/24 0713  WBC 14.9* 11.6* 11.3* 11.5*  NEUTROABS 12.5* 8.5* 8.7*  --   HGB 11.7* 11.6* 11.5* 11.3*  HCT 38.9 37.2 36.2 36.7  MCV 85.7 84.5 82.1 83.4  PLT 225 264 254 272   Basic Metabolic Panel: Recent Labs  Lab 02/10/24 0338 02/11/24 1438 02/12/24 1420 02/13/24 0713  NA 135 133* 134* 133*  K 3.5 3.9 5.0 4.3  CL 100 96* 97* 100  CO2 25 24 21* 24  GLUCOSE 231* 152* 139* 165*  BUN 25* 39* 32* 26*  CREATININE 0.82 2.15* 1.19* 0.83  CALCIUM  8.4* 9.0 9.1 8.8*  MG  --   --   --  2.1  PHOS  --   --   --  3.3   GFR: Estimated Creatinine Clearance: 94 mL/min (by C-G formula based on SCr of 0.83 mg/dL). Liver Function Tests: Recent Labs  Lab 02/10/24 0338 02/11/24 1438 02/12/24 1420  AST 19 32 26  ALT 20 27 22   ALKPHOS 66 74 82  BILITOT 0.3 0.7 0.7   PROT 6.7 6.3* 6.6  ALBUMIN 3.0* 3.3* 3.1*   No results for input(s): LIPASE, AMYLASE in the last 168 hours. No results for input(s): AMMONIA in the last 168 hours. Coagulation Profile: No results for input(s): INR, PROTIME in the last 168 hours. Cardiac Enzymes: No results for input(s): CKTOTAL, CKMB, CKMBINDEX, TROPONINI in the last 168 hours. BNP (last 3 results) No results for input(s): PROBNP in the last 8760 hours. HbA1C: No results for input(s): HGBA1C in  the last 72 hours. CBG: Recent Labs  Lab 02/14/24 1211 02/14/24 1626 02/14/24 2111 02/15/24 0812 02/15/24 1136  GLUCAP 143* 178* 176* 127* 173*   Lipid Profile: No results for input(s): CHOL, HDL, LDLCALC, TRIG, CHOLHDL, LDLDIRECT in the last 72 hours. Thyroid Function Tests: No results for input(s): TSH, T4TOTAL, FREET4, T3FREE, THYROIDAB in the last 72 hours. Anemia Panel: No results for input(s): VITAMINB12, FOLATE, FERRITIN, TIBC, IRON, RETICCTPCT in the last 72 hours. Sepsis Labs: No results for input(s): PROCALCITON, LATICACIDVEN in the last 168 hours.  No results found for this or any previous visit (from the past 240 hours).       Radiology Studies: No results found.         LOS: 3 days   Time spent= 35 mins    Burgess JAYSON Dare, MD [facility]  If 7PM-7AM, please contact night-coverage  02/15/2024, 11:42 AM

## 2024-02-15 NOTE — NC FL2 (Signed)
 [place]  MEDICAID FL2 LEVEL OF CARE FORM     IDENTIFICATION  Patient Name: Patricia Delacruz Birthdate: September 25, 1955 Sex: female Admission Date (Current Location): 02/10/2024  [facility] and [place] Number:  Producer, television/film/video and Address:  The [place]. [facility], [address] [place], [place] [address]      Provider Number: [id number]  Attending Physician Name and Address:  Caleen Burgess BROCKS, MD  Relative Name and Phone Number:       Current Level of Care: Hospital Recommended Level of Care: Skilled Nursing Facility Prior Approval Number:    Date Approved/Denied:   PASRR Number: [id number]  Discharge Plan: SNF    Current Diagnoses: Patient Active Problem List   Diagnosis Date Noted   Rib fractures 02/12/2024   Fracture of multiple ribs 02/10/2024   Clavicle fracture 02/10/2024   Fall 02/10/2024   Multiple rib fractures 02/10/2024   Type 2 diabetes mellitus with hyperglycemia (HCC) 02/10/2024   Grade I diastolic dysfunction 02/10/2024   Diabetes mellitus due to underlying condition with unspecified complications (HCC) 09/29/2023   Cardiac murmur 09/29/2023   Dyspnea on exertion 09/29/2023   Obesity (BMI 35.0-39.9 without comorbidity) 09/29/2023   Complication of anesthesia    Condyloma acuminata    High risk HPV infection    History of kidney stones    Hyperlipidemia    Hypertension    Surgical menopause 09/28/2013   Uterus fibroma 02/02/2012   Condyloma acuminatum of vulva 01/28/2012   CIN II (cervical intraepithelial neoplasia II) 01/28/2012   Depression 01/28/2012   Anxiety 01/28/2012    Orientation RESPIRATION BLADDER Height & Weight     Self, Time, Situation, Place  Normal Incontinent Weight: 250 lb 3.6 oz (113.5 kg) Height:  6' (182.9 cm)  BEHAVIORAL SYMPTOMS/MOOD NEUROLOGICAL BOWEL NUTRITION STATUS      Continent Diet (See DC Summary)  AMBULATORY STATUS COMMUNICATION OF NEEDS Skin   Limited Assist Verbally Normal                        Personal Care Assistance Level of Assistance  Bathing, Feeding, Dressing Bathing Assistance: Limited assistance Feeding assistance: Limited assistance Dressing Assistance: Limited assistance     Functional Limitations Info  Sight, Hearing, Speech Sight Info: Impaired Hearing Info: Adequate Speech Info: Adequate    SPECIAL CARE FACTORS FREQUENCY  PT (By licensed PT), OT (By licensed OT)     PT Frequency: 5x/week OT Frequency: 5x/week            Contractures Contractures Info: Not present    Additional Factors Info  Allergies, Code Status Code Status Info: Full Allergies Info: Statins; Tetracycline & Related           Current Medications (02/15/2024):  This is the current hospital active medication list Current Facility-Administered Medications  Medication Dose Route Frequency Provider Last Rate Last Admin   acetaminophen  (TYLENOL ) tablet 1,000 mg  1,000 mg Oral Q6H Vicci Sor R, PA-C   1,000 mg at 02/15/24 1228   acetaminophen  (TYLENOL ) tablet 650 mg  650 mg Oral Q6H PRN Amin, Ankit C, MD       buPROPion  (WELLBUTRIN  XL) 24 hr tablet 450 mg  450 mg Oral Daily Celinda Alm Lot, MD   450 mg at 02/15/24 1037   Chlorhexidine  Gluconate Cloth 2 % PADS 6 each  6 each Topical Daily Perri DELENA Meliton Mickey., MD   6 each at 02/15/24 1038   DULoxetine  (CYMBALTA ) DR capsule 30 mg  30 mg Oral Daily Vicci Burnard SAUNDERS, PA-C   30 mg at 02/15/24 1037   enoxaparin  (LOVENOX ) injection 30 mg  30 mg Subcutaneous Q12H Augustus Almarie RAMAN, PA-C   30 mg at 02/15/24 1038   gabapentin  (NEURONTIN ) capsule 100 mg  100 mg Oral TID Perri DELENA Meliton Mickey., MD   100 mg at 02/15/24 1457   glucagon  (human recombinant) (GLUCAGEN) injection 1 mg  1 mg Intravenous PRN Amin, Ankit C, MD       guaiFENesin  (ROBITUSSIN) 100 MG/5ML liquid 5 mL  5 mL Oral Q4H PRN Amin, Ankit C, MD       hydrALAZINE  (APRESOLINE ) injection 10 mg  10 mg Intravenous Q4H PRN Amin, Ankit C, MD       HYDROmorphone  (DILAUDID )  injection 0.5 mg  0.5 mg Intravenous Q2H PRN Perri DELENA Meliton Mickey., MD       Or   HYDROmorphone  (DILAUDID ) injection 1 mg  1 mg Intravenous Q2H PRN Perri DELENA Meliton Mickey., MD   1 mg at 02/15/24 1457   hydroxypropyl methylcellulose / hypromellose (ISOPTO TEARS / GONIOVISC) 2.5 % ophthalmic solution 1 drop  1 drop Both Eyes Daily PRN Celinda Alm Lot, MD       insulin  aspart (novoLOG ) injection 0-9 Units  0-9 Units Subcutaneous TID WC Howerter, Justin B, DO   2 Units at 02/15/24 1229   ipratropium-albuterol  (DUONEB) 0.5-2.5 (3) MG/3ML nebulizer solution 3 mL  3 mL Nebulization Q4H PRN Amin, Ankit C, MD       lidocaine  (LIDODERM ) 5 % 2 patch  2 patch Transdermal Q24H Vicci Burnard SAUNDERS, PA-C   2 patch at 02/15/24 1036   LORazepam  (ATIVAN ) tablet 1 mg  1 mg Oral BID PRN Celinda Alm Lot, MD   1 mg at 02/15/24 1037   melatonin tablet 3 mg  3 mg Oral QHS PRN Howerter, Justin B, DO   3 mg at 02/13/24 2210   methocarbamol  (ROBAXIN ) tablet 1,000 mg  1,000 mg Oral TID Simaan, Elizabeth S, PA-C   1,000 mg at 02/15/24 1457   metoprolol  tartrate (LOPRESSOR ) injection 5 mg  5 mg Intravenous Q4H PRN Amin, Ankit C, MD       naloxone  (NARCAN ) injection 0.4 mg  0.4 mg Intravenous PRN Howerter, Justin B, DO       ondansetron  (ZOFRAN ) injection 4 mg  4 mg Intravenous Q6H PRN Amin, Ankit C, MD       oxyCODONE  (Oxy IR/ROXICODONE ) immediate release tablet 5 mg  5 mg Oral Q4H PRN Perri DELENA Meliton Mickey., MD   5 mg at 02/15/24 0117   Or   oxyCODONE  (Oxy IR/ROXICODONE ) immediate release tablet 10 mg  10 mg Oral Q4H PRN Perri DELENA Meliton Mickey., MD       pantoprazole  (PROTONIX ) EC tablet 40 mg  40 mg Oral Daily Reome, Earle J, RPH   40 mg at 02/15/24 1037   pioglitazone  (ACTOS ) tablet 30 mg  30 mg Oral Daily Celinda Alm Lot, MD   30 mg at 02/15/24 1038   polyethylene glycol (MIRALAX  / GLYCOLAX ) packet 17 g  17 g Oral BID Perri DELENA Meliton Mickey., MD   17 g at 02/15/24 1037   zolpidem  (AMBIEN ) tablet 5 mg  5 mg Oral QHS  PRN Celinda Alm Lot, MD   5 mg at 02/14/24 2104     Discharge Medications: Please see discharge summary for a list of discharge medications.  Relevant Imaging Results:  Relevant Lab Results:   Additional Information  [id number]  Jeoffrey LITTIE Moose, LCSWA

## 2024-02-15 NOTE — Progress Notes (Signed)
 Occupational Therapy Treatment Patient Details Name: Patricia Delacruz MRN: [id number] DOB: [birth year] Today's Date: 02/15/2024   History of present illness Patient is a 68 y/o female admitted 02/09/24 with fall down stairs at home noted R clavicle fx, R first rib, and 3-6 rib fx.  PMH positive for DM, CIN II (cervical intraepithelial neoplasia), h/o cervical/vulvar cancer, HTN, HLD and obesity.   OT comments  Pt is making limited progress towards their acute OT goals. Pt was significantly limited by pain this session. She needed max A for bed mobility, bed placed in chair position for. Her elbow ROM was limited due to pain but wrist and hand ROM remain WFL. Ice applied to R shoulder, pillow splinted and RN made aware of pt's request for medication. OT to continue to follow acutely to facilitate progress towards established goals. Pt will benefit from skilled inpatient follow up therapy, <3 hours/day.        If plan is discharge home, recommend the following:  A little help with walking and/or transfers;A lot of help with bathing/dressing/bathroom;Assistance with cooking/housework;Assist for transportation;Help with stairs or ramp for entrance   Equipment Recommendations  Hospital bed       Precautions / Restrictions Precautions Precautions: Fall Recall of Precautions/Restrictions: Impaired Required Braces or Orthoses: Sling Restrictions Weight Bearing Restrictions Per Provider Order: Yes RUE Weight Bearing Per Provider Order: Non weight bearing Other Position/Activity Restrictions: PA from ortho approved use of rollator for balance if tolerated without pushing weight on R hand; Okay for wrist and elbow motion as tolerated. Ok to start gentle pendulum exercises of the shoulder.       Mobility Bed Mobility Overal bed mobility: Needs Assistance Bed Mobility: Rolling Rolling: Max assist         General bed mobility comments: max A for bed mobility, pt not agreeable to get OOb this  session. increased pain limited bed mobility    Transfers                ADL either performed or assessed with clinical judgement   ADL Overall ADL's : Needs assistance/impaired               Functional mobility during ADLs: Maximal assistance General ADL Comments: session limited by pain. pt agreeable to bed level session only. max A needed for bed mobility    Extremity/Trunk Assessment Upper Extremity Assessment Upper Extremity Assessment: RUE deficits/detail RUE Deficits / Details: not tolerating attempt for pendulums this date. Limited elbow ROM due to pain exacerbation. RUE Sensation: WNL RUE Coordination: decreased fine motor;decreased gross motor   Lower Extremity Assessment Lower Extremity Assessment: Defer to PT evaluation        Vision   Vision Assessment?: No apparent visual deficits   Perception Perception Perception: Not tested   Praxis Praxis Praxis: Not tested   Communication Communication Communication: No apparent difficulties   Cognition Arousal: Alert Behavior During Therapy: Flat affect Cognition: No apparent impairments             OT - Cognition Comments: continues to be flat and anxious                 Following commands: Impaired Following commands impaired: Follows one step commands inconsistently, Follows one step commands with increased time      Cueing   Cueing Techniques: Verbal cues, Gestural cues  Exercises General Exercises - Upper Extremity Elbow Flexion: AROM, Right, 5 reps, Seated Elbow Extension: AROM, Right, Seated Wrist Flexion: AROM, Right, 5 reps, Seated  Wrist Extension: AROM, Right, 5 reps, Seated Digit Composite Flexion: AROM, Right, 5 reps, Seated Composite Extension: AROM, Right, 5 reps, Seated    Shoulder Instructions       General Comments VSS on RA, pt pain limited this session. pt repositioned, ice applied and RN notified    Pertinent Vitals/ Pain       Pain Assessment Pain  Assessment: Faces Faces Pain Scale: Hurts whole lot Pain Location: F flank and R shoulder Pain Descriptors / Indicators: Grimacing, Guarding, Discomfort, Burning, Aching, Constant Pain Intervention(s): Limited activity within patient's tolerance, Monitored during session, Repositioned, Ice applied   Frequency  Min 2X/week        Progress Toward Goals  OT Goals(current goals can now be found in the care plan section)  Progress towards OT goals: Progressing toward goals  Acute Rehab OT Goals Patient Stated Goal: less pain OT Goal Formulation: With patient Time For Goal Achievement: 02/25/24 Potential to Achieve Goals: Good ADL Goals Pt Will Perform Grooming: with supervision Pt Will Perform Upper Body Dressing: with set-up Pt Will Perform Lower Body Dressing: with min assist Pt Will Transfer to Toilet: with modified independence Pt/caregiver will Perform Home Exercise Program: Increased ROM;Right Upper extremity;With written HEP provided   AM-PAC OT 6 Clicks Daily Activity     Outcome Measure   Help from another person eating meals?: None Help from another person taking care of personal grooming?: A Little Help from another person toileting, which includes using toliet, bedpan, or urinal?: A Lot Help from another person bathing (including washing, rinsing, drying)?: A Lot Help from another person to put on and taking off regular upper body clothing?: A Lot Help from another person to put on and taking off regular lower body clothing?: A Lot 6 Click Score: 15    End of Session    OT Visit Diagnosis: Unsteadiness on feet (R26.81);Other abnormalities of gait and mobility (R26.89);Muscle weakness (generalized) (M62.81);Pain;History of falling (Z91.81)   Activity Tolerance Patient tolerated treatment well   Patient Left in chair;with call bell/phone within reach;with family/visitor present   Nurse Communication Other (comment);Mobility status        Time:  8762-8743 OT Time Calculation (min): 19 min  Charges: OT General Charges $OT Visit: 1 Visit OT Treatments $Therapeutic Activity: 8-22 mins  Patricia Delacruz, OTR/L Acute Rehabilitation Services Office [phone] Secure Chat Communication Preferred   Patricia JONETTA Delacruz 02/15/2024, 3:15 PM

## 2024-02-15 NOTE — Hospital Course (Addendum)
 Brief Narrative:   68 y.o. female with medical history significant of anxiety, depression, depression, hyperlipidemia, hypertension, type 2 diabetes, obesity who had a mechanical fall at home going down the stairs.   CT showed right 1st rib fracture, right 3rd-6th rib fracture, nondisplaced fracture posterior r 2nd rib, and R clavicular fracture.   Admitted to hospitalist service with trauma surgery following.  Orthopedics recommended NWB to RUE, sling immobilization.  Trauma has now signed off.  Ongoing pain control.  PT recommending SNF.   Assessment & Plan:  Principal Problem:   Fracture of multiple ribs Active Problems:   Depression   Anxiety   Hyperlipidemia   Hypertension   Obesity (BMI 35.0-39.9 without comorbidity)   Clavicle fracture   Fall   Type 2 diabetes mellitus with hyperglycemia (HCC)   Grade I diastolic dysfunction   Rib fractures     Right Mid Clavicle Fracture Right 1st Rib Fracture  Right 3rd-6th Rib Fracture.  Nondisplaced Fracture Posterior R 2nd Rib Mechanical Fall -Appreciate ortho - Non op management of clavicle fracture - NWB to RUE, sling immobilization, ok for wrist and elbow motion as tolerated.  Ok to start gentle pendulum exercises of the shoulder.   Follow up with Dr. Kendal 2 weeks after discharge for repeat x rays. 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  Trauma now signed off - follow with Dr. Kendal in 2 weeks and with PCP in 1-2 weeks for post hospital follow up.  -Will continue adjust her pain regimen as appropriate.  Bowel regimen.   R apical PTX -Noted on CT Repeat x ray without PTX noted    AKI -Resolved.   Orthostatic Hypotension C/o LH/nausea with standing on 8/23 - Resolved   Physical Deconditioning -Therapy evaluation  Hypertension -Ramipril     HFpEF, EF 65%.  Mild AS -Will resume home meds  upon discharge   Depression  Anxiety -Wellbutrin , cymbalta , ativan  prn, ambien  nightly   T2DM  Resume home regimen upon discharge   GERD -PPI   Urinary Retention - Hally catheter removed 8/27.  Repeat bladder scan as necessary   Obesity Body mass index is 34.15 kg/m.       DVT prophylaxis: lovenox  30 mg BID per trauma guidelines Code Status: full Family Communication: family at bedside Disposition:    SNF placement   PT Follow up Recs: Skilled Nursing-Short Term Rehab (<3 Hours/Day)02/14/2024 1500  Subjective: No complaints feeling okay  Examination:  General exam: Appears calm and comfortable  Respiratory system: Clear to auscultation. Respiratory effort normal. Cardiovascular system: S1 & S2 heard, RRR. No JVD, murmurs, rubs, gallops or clicks. No pedal edema. Gastrointestinal system: Abdomen is nondistended, soft and nontender. No organomegaly or masses felt. Normal bowel sounds heard. Central nervous system: Alert and oriented. No focal neurological deficits. Extremities: Symmetric 5 x 5 power. Skin: No rashes, lesions or ulcers Psychiatry: Judgement and insight appear normal. Mood & affect appropriate.

## 2024-02-15 NOTE — TOC Progression Note (Signed)
 Transition of Care (TOC) - Progression Note   PT recommendation has changed to SNF.   NCM updated Mitch with [employer] and [name] with [facility]  Patient Details  Name: Patricia Delacruz MRN: [id number] Date of Birth: 12-27-1955  Transition of Care [facility]) CM/SW Contact  Kerria Sapien, Powell Jansky, RN Phone Number: 02/15/2024, 8:32 AM  Clinical Narrative:       Expected Discharge Plan: Home w Home Health Services Barriers to Discharge: Continued Medical Work up               Expected Discharge Plan and Services   Discharge Planning Services: CM Consult Post Acute Care Choice: Home Health Living arrangements for the past 2 months: Single Family Home                 DME Arranged: N/A         HH Arranged: PT, OT HH Agency: [name] Date [facility] Agency Contacted: 02/10/24 Time HH Agency Contacted: 58 Representative spoke with at [facility] Agency: [name]   Social Drivers of Health (SDOH) Interventions SDOH Screenings   Food Insecurity: No Food Insecurity (02/10/2024)  Housing: Low Risk  (02/11/2024)  Transportation Needs: No Transportation Needs (02/11/2024)  Utilities: Not At Risk (02/10/2024)  Social Connections: Socially Integrated (02/10/2024)  Tobacco Use: Low Risk  (02/10/2024)    Readmission Risk Interventions     No data to display

## 2024-02-15 NOTE — NC FL2 (Signed)
 [place]  MEDICAID FL2 LEVEL OF CARE FORM     IDENTIFICATION  Patient Name: Patricia Delacruz Birthdate: 1955/10/24 Sex: female Admission Date (Current Location): 02/10/2024  [facility] and [place] Number:  Producer, television/film/video and Address:  The [place]. [facility], [address] [place], [place] [address]      Provider Number: [id number]  Attending Physician Name and Address:  Caleen Burgess BROCKS, MD  Relative Name and Phone Number:       Current Level of Care: Hospital Recommended Level of Care: Skilled Nursing Facility Prior Approval Number:    Date Approved/Denied:   PASRR Number:    Discharge Plan: SNF    Current Diagnoses: Patient Active Problem List   Diagnosis Date Noted   Rib fractures 02/12/2024   Fracture of multiple ribs 02/10/2024   Clavicle fracture 02/10/2024   Fall 02/10/2024   Multiple rib fractures 02/10/2024   Type 2 diabetes mellitus with hyperglycemia (HCC) 02/10/2024   Grade I diastolic dysfunction 02/10/2024   Diabetes mellitus due to underlying condition with unspecified complications (HCC) 09/29/2023   Cardiac murmur 09/29/2023   Dyspnea on exertion 09/29/2023   Obesity (BMI 35.0-39.9 without comorbidity) 09/29/2023   Complication of anesthesia    Condyloma acuminata    High risk HPV infection    History of kidney stones    Hyperlipidemia    Hypertension    Surgical menopause 09/28/2013   Uterus fibroma 02/02/2012   Condyloma acuminatum of vulva 01/28/2012   CIN II (cervical intraepithelial neoplasia II) 01/28/2012   Depression 01/28/2012   Anxiety 01/28/2012    Orientation RESPIRATION BLADDER Height & Weight     Self, Time, Situation, Place  Normal Incontinent Weight: 250 lb 3.6 oz (113.5 kg) Height:  6' (182.9 cm)  BEHAVIORAL SYMPTOMS/MOOD NEUROLOGICAL BOWEL NUTRITION STATUS      Continent Diet (See DC Summary)  AMBULATORY STATUS COMMUNICATION OF NEEDS Skin   Limited Assist Verbally Normal                        Personal Care Assistance Level of Assistance  Bathing, Feeding, Dressing Bathing Assistance: Limited assistance Feeding assistance: Limited assistance Dressing Assistance: Limited assistance     Functional Limitations Info  Sight, Hearing, Speech Sight Info: Impaired Hearing Info: Adequate Speech Info: Adequate    SPECIAL CARE FACTORS FREQUENCY  PT (By licensed PT), OT (By licensed OT)     PT Frequency: 5x/week OT Frequency: 5x/week            Contractures Contractures Info: Not present    Additional Factors Info  Allergies, Code Status Code Status Info: Full Allergies Info: Statins; Tetracycline & Related           Current Medications (02/15/2024):  This is the current hospital active medication list Current Facility-Administered Medications  Medication Dose Route Frequency Provider Last Rate Last Admin   acetaminophen  (TYLENOL ) tablet 1,000 mg  1,000 mg Oral Q6H Johnson, Kelly R, PA-C   1,000 mg at 02/15/24 1228   acetaminophen  (TYLENOL ) tablet 650 mg  650 mg Oral Q6H PRN Amin, Ankit C, MD       buPROPion  (WELLBUTRIN  XL) 24 hr tablet 450 mg  450 mg Oral Daily Celinda Alm Lot, MD   450 mg at 02/15/24 1037   Chlorhexidine  Gluconate Cloth 2 % PADS 6 each  6 each Topical Daily Perri DELENA Meliton Mickey., MD   6 each at 02/15/24 1038   DULoxetine  (CYMBALTA ) DR capsule 30 mg  30 mg Oral Daily Vicci Burnard SAUNDERS, PA-C   30 mg at 02/15/24 1037   enoxaparin  (LOVENOX ) injection 30 mg  30 mg Subcutaneous Q12H Simaan, Elizabeth S, PA-C   30 mg at 02/15/24 1038   gabapentin  (NEURONTIN ) capsule 100 mg  100 mg Oral TID Perri DELENA Meliton Mickey., MD   100 mg at 02/15/24 1037   glucagon  (human recombinant) (GLUCAGEN) injection 1 mg  1 mg Intravenous PRN Amin, Ankit C, MD       guaiFENesin  (ROBITUSSIN) 100 MG/5ML liquid 5 mL  5 mL Oral Q4H PRN Amin, Ankit C, MD       hydrALAZINE  (APRESOLINE ) injection 10 mg  10 mg Intravenous Q4H PRN Amin, Ankit C, MD       HYDROmorphone  (DILAUDID )  injection 0.5 mg  0.5 mg Intravenous Q2H PRN Perri DELENA Meliton Mickey., MD       Or   HYDROmorphone  (DILAUDID ) injection 1 mg  1 mg Intravenous Q2H PRN Perri DELENA Meliton Mickey., MD   1 mg at 02/15/24 1040   hydroxypropyl methylcellulose / hypromellose (ISOPTO TEARS / GONIOVISC) 2.5 % ophthalmic solution 1 drop  1 drop Both Eyes Daily PRN Celinda Alm Lot, MD       insulin  aspart (novoLOG ) injection 0-9 Units  0-9 Units Subcutaneous TID WC Howerter, Justin B, DO   2 Units at 02/15/24 1229   ipratropium-albuterol  (DUONEB) 0.5-2.5 (3) MG/3ML nebulizer solution 3 mL  3 mL Nebulization Q4H PRN Amin, Ankit C, MD       lidocaine  (LIDODERM ) 5 % 2 patch  2 patch Transdermal Q24H Vicci Burnard SAUNDERS, PA-C   2 patch at 02/15/24 1036   LORazepam  (ATIVAN ) tablet 1 mg  1 mg Oral BID PRN Celinda Alm Lot, MD   1 mg at 02/15/24 1037   melatonin tablet 3 mg  3 mg Oral QHS PRN Howerter, Justin B, DO   3 mg at 02/13/24 2210   methocarbamol  (ROBAXIN ) tablet 1,000 mg  1,000 mg Oral TID Simaan, Elizabeth S, PA-C   1,000 mg at 02/15/24 1037   metoprolol  tartrate (LOPRESSOR ) injection 5 mg  5 mg Intravenous Q4H PRN Amin, Ankit C, MD       naloxone  (NARCAN ) injection 0.4 mg  0.4 mg Intravenous PRN Howerter, Justin B, DO       ondansetron  (ZOFRAN ) injection 4 mg  4 mg Intravenous Q6H PRN Amin, Ankit C, MD       oxyCODONE  (Oxy IR/ROXICODONE ) immediate release tablet 5 mg  5 mg Oral Q4H PRN Perri DELENA Meliton Mickey., MD   5 mg at 02/15/24 0117   Or   oxyCODONE  (Oxy IR/ROXICODONE ) immediate release tablet 10 mg  10 mg Oral Q4H PRN Perri DELENA Meliton Mickey., MD       pantoprazole  (PROTONIX ) EC tablet 40 mg  40 mg Oral Daily Reome, Earle J, RPH   40 mg at 02/15/24 1037   pioglitazone  (ACTOS ) tablet 30 mg  30 mg Oral Daily Celinda Alm Lot, MD   30 mg at 02/15/24 1038   polyethylene glycol (MIRALAX  / GLYCOLAX ) packet 17 g  17 g Oral BID Perri DELENA Meliton Mickey., MD   17 g at 02/15/24 1037   zolpidem  (AMBIEN ) tablet 5 mg  5 mg Oral QHS  PRN Celinda Alm Lot, MD   5 mg at 02/14/24 2104     Discharge Medications: Please see discharge summary for a list of discharge medications.  Relevant Imaging Results:  Relevant Lab Results:   Additional Information  [id number]  Jeoffrey LITTIE Moose, LCSWA

## 2024-02-15 NOTE — Progress Notes (Signed)
 Mobility Specialist Progress Note:    02/15/24 1511  Mobility  Activity Ambulated with assistance (In room)  Level of Assistance Minimal assist, patient does 75% or more  Assistive Device Front wheel walker  Distance Ambulated (ft) 20 ft  RUE Weight Bearing Per Provider Order NWB  Activity Response Tolerated well  Mobility Referral Yes  Mobility visit 1 Mobility  Mobility Specialist Start Time (ACUTE ONLY) 1017  Mobility Specialist Stop Time (ACUTE ONLY) 1027  Mobility Specialist Time Calculation (min) (ACUTE ONLY) 10 min   Received pt in chair and agreeable to mobility. Pt required MinA STS, contact guard during ambulation. Pt ambulated to door, then back to chair. C/o RUE soreness, otherwise tolerated well. Left pt in chair with personal belongings and call light within reach. All needs met.  Lavanda Pollack Mobility Specialist  Please contact via [employer] or  Rehab Office [phone]

## 2024-02-16 ENCOUNTER — Ambulatory Visit: Admitting: Podiatry

## 2024-02-16 ENCOUNTER — Other Ambulatory Visit (HOSPITAL_COMMUNITY): Payer: Self-pay

## 2024-02-16 DIAGNOSIS — S2241XD Multiple fractures of ribs, right side, subsequent encounter for fracture with routine healing: Secondary | ICD-10-CM | POA: Diagnosis not present

## 2024-02-16 LAB — CBC
HCT: 33.5 % — ABNORMAL LOW (ref 36.0–46.0)
Hemoglobin: 10.4 g/dL — ABNORMAL LOW (ref 12.0–15.0)
MCH: 26.1 pg (ref 26.0–34.0)
MCHC: 31 g/dL (ref 30.0–36.0)
MCV: 84.2 fL (ref 80.0–100.0)
Platelets: 245 K/uL (ref 150–400)
RBC: 3.98 MIL/uL (ref 3.87–5.11)
RDW: 16.8 % — ABNORMAL HIGH (ref 11.5–15.5)
WBC: 7.1 K/uL (ref 4.0–10.5)
nRBC: 0 % (ref 0.0–0.2)

## 2024-02-16 LAB — BASIC METABOLIC PANEL WITH GFR
Anion gap: 11 (ref 5–15)
BUN: 16 mg/dL (ref 8–23)
CO2: 27 mmol/L (ref 22–32)
Calcium: 8.2 mg/dL — ABNORMAL LOW (ref 8.9–10.3)
Chloride: 96 mmol/L — ABNORMAL LOW (ref 98–111)
Creatinine, Ser: 0.61 mg/dL (ref 0.44–1.00)
GFR, Estimated: >60 mL/min (ref >60)
Glucose, Bld: 116 mg/dL — ABNORMAL HIGH (ref 70–99)
Potassium: 4.2 mmol/L (ref 3.5–5.1)
Sodium: 134 mmol/L — ABNORMAL LOW (ref 135–145)

## 2024-02-16 LAB — GLUCOSE, CAPILLARY
Glucose-Capillary: 123 mg/dL — ABNORMAL HIGH (ref 70–99)
Glucose-Capillary: 220 mg/dL — ABNORMAL HIGH (ref 70–99)
Glucose-Capillary: 170 mg/dL — ABNORMAL HIGH (ref 70–99)

## 2024-02-16 LAB — MAGNESIUM: Magnesium: 2 mg/dL (ref 1.7–2.4)

## 2024-02-16 LAB — PHOSPHORUS: Phosphorus: 2.6 mg/dL (ref 2.5–4.6)

## 2024-02-16 MED ORDER — LORAZEPAM 1 MG PO TABS: 1.0000 mg | ORAL_TABLET | Freq: Two times a day (BID) | ORAL | 0 refills | Status: AC | PRN

## 2024-02-16 MED ORDER — OXYCODONE HCL 10 MG PO TABS: 10.0000 mg | ORAL_TABLET | Freq: Four times a day (QID) | ORAL | 0 refills | Status: DC | PRN

## 2024-02-16 MED ORDER — GABAPENTIN 100 MG PO CAPS: 100.0000 mg | ORAL_CAPSULE | Freq: Three times a day (TID) | ORAL | Status: AC

## 2024-02-16 MED ORDER — METHOCARBAMOL 500 MG PO TABS: 1000.0000 mg | ORAL_TABLET | Freq: Three times a day (TID) | ORAL | 0 refills | Status: AC | PRN

## 2024-02-16 MED ORDER — POLYETHYLENE GLYCOL 3350 17 G PO PACK: 17.0000 g | PACK | Freq: Two times a day (BID) | ORAL | Status: AC

## 2024-02-16 NOTE — Plan of Care (Signed)
  Problem: Coping: Goal: Ability to adjust to condition or change in health will improve 02/16/2024 1913 by Bernardo Boast, LPN Outcome: Progressing 02/16/2024 1812 by Bernardo Boast, LPN Outcome: Progressing   Problem: Nutritional: Goal: Maintenance of adequate nutrition will improve Outcome: Progressing   Problem: Activity: Goal: Risk for activity intolerance will decrease 02/16/2024 1913 by Bernardo Boast, LPN Outcome: Progressing 02/16/2024 1812 by Bernardo Boast, LPN Outcome: Progressing   Problem: Nutrition: Goal: Adequate nutrition will be maintained Outcome: Progressing   Problem: Pain Managment: Goal: General experience of comfort will improve and/or be controlled 02/16/2024 1913 by Bernardo Boast, LPN Outcome: Progressing 02/16/2024 1812 by Bernardo Boast, LPN Outcome: Progressing   Problem: Safety: Goal: Ability to remain free from injury will improve 02/16/2024 1913 by Bernardo Boast, LPN Outcome: Progressing 02/16/2024 1812 by Bernardo Boast, LPN Outcome: Progressing

## 2024-02-16 NOTE — Progress Notes (Signed)
 Patient rates her pain as 6 out of 10 currently. Administered scheduled pain medications prior to patient working with PT. Husband insist on  patient receiving PRN medication for pain but I explained to patient and husband how scheduled pain medication is administered first and then reassessment for PRN pain mediations. Request patient to informed me if she needs additional pain medication after working with PT. Dr. Burgess Dare previously at bedside explained to pt that some pain is expected due to her injuries and that she will have to work through some pain. Patient verbalize she understands.

## 2024-02-16 NOTE — Progress Notes (Signed)
 Dr. Burgess Dare at bedside and per MD d'c bladder scan q6 orders.

## 2024-02-16 NOTE — TOC Progression Note (Signed)
 Transition of Care [facility]) - Progression Note    Patient Details  Name: Patricia Delacruz MRN: [id number] Date of Birth: 1956/05/13  [facility] CM/SW Contact  [place] LITTIE Moose, [place] Phone Number: 02/16/2024, 10:57 AM  Clinical Narrative:    CSW provided pt with bed offers. Will follow up on facility choice.   Expected Discharge Plan: Home w Home Health Services Barriers to Discharge: Continued Medical Work up               Expected Discharge Plan and Services   Discharge Planning Services: CM Consult Post Acute Care Choice: Home Health Living arrangements for the past 2 months: Single Family Home                 DME Arranged: N/A         HH Arranged: PT, OT HH Agency: [name] Date [facility] Agency Contacted: 02/10/24 Time HH Agency Contacted: 1507 Representative spoke with at [facility] Agency: [name]   Social Drivers of Health (SDOH) Interventions SDOH Screenings   Food Insecurity: No Food Insecurity (02/10/2024)  Housing: Low Risk  (02/11/2024)  Transportation Needs: No Transportation Needs (02/11/2024)  Utilities: Not At Risk (02/10/2024)  Social Connections: Socially Integrated (02/10/2024)  Tobacco Use: Low Risk  (02/10/2024)    Readmission Risk Interventions     No data to display

## 2024-02-16 NOTE — Progress Notes (Signed)
 Mobility Specialist Progress Note:    02/16/24 1445  Mobility  Activity Ambulated with assistance (In hallway)  Level of Assistance Contact guard assist, steadying assist  Assistive Device Other (Comment) (HHA)  Distance Ambulated (ft) 100 ft  RUE Weight Bearing Per Provider Order NWB  Activity Response Tolerated well  Mobility Referral Yes  Mobility visit 1 Mobility  Mobility Specialist Start Time (ACUTE ONLY) 1428  Mobility Specialist Stop Time (ACUTE ONLY) 1438  Mobility Specialist Time Calculation (min) (ACUTE ONLY) 10 min   Received pt in chair and agreeable to mobility. Contact guard during ambulation and chair follow for safety. Pt kept veering to the R, but self corrected. C/o soreness, unspecified the location. Returned to room without fault. Left pt in chair. Personal belongings and call light within reach. All needs met.  Lavanda Pollack Mobility Specialist  Please contact via [employer] or  Rehab Office [phone]

## 2024-02-16 NOTE — Plan of Care (Signed)
  Problem: Coping: Goal: Ability to adjust to condition or change in health will improve Outcome: Progressing   Problem: Activity: Goal: Risk for activity intolerance will decrease Outcome: Progressing   Problem: Nutrition: Goal: Adequate nutrition will be maintained Outcome: Progressing   Problem: Pain Managment: Goal: General experience of comfort will improve and/or be controlled Outcome: Progressing   Problem: Safety: Goal: Ability to remain free from injury will improve Outcome: Progressing

## 2024-02-16 NOTE — Progress Notes (Signed)
 PROGRESS NOTE    Patricia Delacruz  FMW:[id number] DOB: 08-17-1955 DOA: 02/10/2024 PCP: Valma Carwin, MD    Brief Narrative:   68 y.o. female with medical history significant of anxiety, depression, depression, hyperlipidemia, hypertension, type 2 diabetes, obesity who had a mechanical fall at home going down the stairs.   CT showed right 1st rib fracture, right 3rd-6th rib fracture, nondisplaced fracture posterior r 2nd rib, and R clavicular fracture.   Admitted to hospitalist service with trauma surgery following.  Orthopedics recommended NWB to RUE, sling immobilization.  Trauma has now signed off.  Ongoing pain control.  PT recommending SNF.   Assessment & Plan:  Principal Problem:   Fracture of multiple ribs Active Problems:   Depression   Anxiety   Hyperlipidemia   Hypertension   Obesity (BMI 35.0-39.9 without comorbidity)   Clavicle fracture   Fall   Type 2 diabetes mellitus with hyperglycemia (HCC)   Grade I diastolic dysfunction   Rib fractures     Right Mid Clavicle Fracture Right 1st Rib Fracture  Right 3rd-6th Rib Fracture.  Nondisplaced Fracture Posterior R 2nd Rib Mechanical Fall -Appreciate ortho - Non op management of clavicle fracture - NWB to RUE, sling immobilization, ok for wrist and elbow motion as tolerated.  Ok to start gentle pendulum exercises of the shoulder.   Follow up with Dr. Kendal 2 weeks after discharge for repeat x rays. 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  Trauma now signed off - follow with Dr. Kendal in 2 weeks and with PCP in 1-2 weeks for post hospital follow up.    R apical PTX -Noted on CT Repeat x ray without PTX noted    AKI -Resolved.   Orthostatic Hypotension C/o LH/nausea with standing on 8/23 - Resolved   Physical Deconditioning -Therapy evaluation  Hypertension -Ramipril     HFpEF, EF  65%.  Mild AS -Will resume home meds upon discharge   Depression  Anxiety -Wellbutrin , cymbalta , ativan  prn, ambien  nightly   T2DM  -On home pioglitazone  - Sliding scale   GERD -PPI   Urinary Retention - Piekarski catheter removed 8/27   Obesity Body mass index is 34.15 kg/m.       DVT prophylaxis: lovenox  30 mg BID per trauma guidelines Code Status: full Family Communication: family at bedside Disposition:    Status is: Observation The patient remains OBS appropriate and will d/c before 2 midnights.     PT Follow up Recs: Skilled Nursing-Short Term Rehab (<3 Hours/Day)02/14/2024 1500  Subjective:  Doing ok no complaints. Spouse at bedside.  Pain is controlled except when moving.   Examination:  General exam: Appears calm and comfortable  Respiratory system: Clear to auscultation. Respiratory effort normal. Cardiovascular system: S1 & S2 heard, RRR. No JVD, murmurs, rubs, gallops or clicks. No pedal edema. Gastrointestinal system: Abdomen is nondistended, soft and nontender. No organomegaly or masses felt. Normal bowel sounds heard. Central nervous system: Alert and oriented. No focal neurological deficits. Extremities: Symmetric 5 x 5 power. Skin: No rashes, lesions or ulcers Psychiatry: Judgement and insight appear normal. Mood & affect appropriate.                Diet Orders (From admission, onward)     Start     Ordered   02/10/24 1255  Diet heart healthy/carb modified Room service appropriate? Yes; Fluid consistency: Thin  Diet effective now  Question Answer Comment  Diet-HS Snack? Nothing   Room service appropriate? Yes   Fluid consistency: Thin      02/10/24 1254            Objective: Vitals:   02/15/24 2006 02/16/24 0500 02/16/24 0507 02/16/24 0826  BP: (!) 150/61  (!) 160/84 (!) 144/69  Pulse: 94  86 82  Resp: 17  17 18   Temp: 98.2 F (36.8 C)  97.8 F (36.6 C) 98.6 F (37 C)  TempSrc: Oral  Oral   SpO2: 91%  97% 95%   Weight:  113.8 kg    Height:        Intake/Output Summary (Last 24 hours) at 02/16/2024 1048 Last data filed at 02/15/2024 2010 Gross per 24 hour  Intake 120 ml  Output --  Net 120 ml   Filed Weights   02/14/24 0500 02/15/24 0115 02/16/24 0500  Weight: 114.2 kg 113.5 kg 113.8 kg    Scheduled Meds:  acetaminophen   1,000 mg Oral Q6H   buPROPion   450 mg Oral Daily   Chlorhexidine  Gluconate Cloth  6 each Topical Daily   DULoxetine   30 mg Oral Daily   enoxaparin  (LOVENOX ) injection  30 mg Subcutaneous Q12H   gabapentin   100 mg Oral TID   insulin  aspart  0-9 Units Subcutaneous TID WC   lidocaine   2 patch Transdermal Q24H   methocarbamol   1,000 mg Oral TID   pantoprazole   40 mg Oral Daily   pioglitazone   30 mg Oral Daily   polyethylene glycol  17 g Oral BID   Continuous Infusions:  Nutritional status     Body mass index is 34.03 kg/m.  Data Reviewed:   CBC: Recent Labs  Lab 02/10/24 0338 02/11/24 1438 02/12/24 1455 02/13/24 0713 02/16/24 0447  WBC 14.9* 11.6* 11.3* 11.5* 7.1  NEUTROABS 12.5* 8.5* 8.7*  --   --   HGB 11.7* 11.6* 11.5* 11.3* 10.4*  HCT 38.9 37.2 36.2 36.7 33.5*  MCV 85.7 84.5 82.1 83.4 84.2  PLT 225 264 254 272 245   Basic Metabolic Panel: Recent Labs  Lab 02/10/24 0338 02/11/24 1438 02/12/24 1420 02/13/24 0713 02/16/24 0447  NA 135 133* 134* 133* 134*  K 3.5 3.9 5.0 4.3 4.2  CL 100 96* 97* 100 96*  CO2 25 24 21* 24 27  GLUCOSE 231* 152* 139* 165* 116*  BUN 25* 39* 32* 26* 16  CREATININE 0.82 2.15* 1.19* 0.83 0.61  CALCIUM  8.4* 9.0 9.1 8.8* 8.2*  MG  --   --   --  2.1 2.0  PHOS  --   --   --  3.3 2.6   GFR: Estimated Creatinine Clearance: 97.6 mL/min (by C-G formula based on SCr of 0.61 mg/dL). Liver Function Tests: Recent Labs  Lab 02/10/24 0338 02/11/24 1438 02/12/24 1420  AST 19 32 26  ALT 20 27 22   ALKPHOS 66 74 82  BILITOT 0.3 0.7 0.7  PROT 6.7 6.3* 6.6  ALBUMIN 3.0* 3.3* 3.1*   No results for input(s): LIPASE,  AMYLASE in the last 168 hours. No results for input(s): AMMONIA in the last 168 hours. Coagulation Profile: No results for input(s): INR, PROTIME in the last 168 hours. Cardiac Enzymes: No results for input(s): CKTOTAL, CKMB, CKMBINDEX, TROPONINI in the last 168 hours. BNP (last 3 results) No results for input(s): PROBNP in the last 8760 hours. HbA1C: No results for input(s): HGBA1C in the last 72 hours. CBG: Recent Labs  Lab 02/15/24 [phone] 02/15/24 1136 02/15/24  1725 02/15/24 2008 02/16/24 0823  GLUCAP 127* 173* 86 145* 123*   Lipid Profile: No results for input(s): CHOL, HDL, LDLCALC, TRIG, CHOLHDL, LDLDIRECT in the last 72 hours. Thyroid Function Tests: No results for input(s): TSH, T4TOTAL, FREET4, T3FREE, THYROIDAB in the last 72 hours. Anemia Panel: No results for input(s): VITAMINB12, FOLATE, FERRITIN, TIBC, IRON, RETICCTPCT in the last 72 hours. Sepsis Labs: No results for input(s): PROCALCITON, LATICACIDVEN in the last 168 hours.  No results found for this or any previous visit (from the past 240 hours).       Radiology Studies: No results found.         LOS: 4 days   Time spent= 35 mins    Burgess JAYSON Dare, MD [facility]  If 7PM-7AM, please contact night-coverage  02/16/2024, 10:48 AM

## 2024-02-16 NOTE — Progress Notes (Signed)
 Physical Therapy Treatment Patient Details Name: Patricia Delacruz MRN: [id number] DOB: February 14, 1956 Today's Date: 02/16/2024   History of Present Illness Patient is a 68 y/o female admitted 02/09/24 with fall down stairs at home noted R clavicle fx, R first rib, and 3-6 rib fx.  PMH positive for DM, CIN II (cervical intraepithelial neoplasia), h/o cervical/vulvar cancer, HTN, HLD and obesity.    PT Comments  Pt greeted supine in bed with husband present in room. Premedicated pt prior to PT session. Extensive education regarding pain, management strategies, and the importance of participating in functional mobility to aid in the recovery process. Pt is making slow progress towards her acute PT goals. Overall, pt required minA x2. She was unsteady during gait demonstrating postural sway and a right lateral lean. Pt ambulated using RW with LUE support and PT propelling right side of AD. She is limited by pain and decreased activity tolerance. Will continue to follow acutely and advance appropriately.        If plan is discharge home, recommend the following: A little help with walking and/or transfers;A little help with bathing/dressing/bathroom;Help with stairs or ramp for entrance;Assistance with cooking/housework;Assist for transportation   Can travel by private vehicle     No  Equipment Recommendations  Hospital bed    Recommendations for Other Services       Precautions / Restrictions Precautions Precautions: Fall Recall of Precautions/Restrictions: Impaired Required Braces or Orthoses: Sling Restrictions Weight Bearing Restrictions Per Provider Order: Yes RUE Weight Bearing Per Provider Order: Non weight bearing Other Position/Activity Restrictions: PA from ortho approved use of rollator for balance if tolerated without pushing weight on R hand; Okay for wrist and elbow motion as tolerated. Ok to start gentle pendulum exercises of the shoulder.     Mobility  Bed Mobility Overal bed  mobility: Needs Assistance Bed Mobility: Supine to Sit     Supine to sit: Min assist, +2 for physical assistance, +2 for safety/equipment, HOB elevated, Used rails     General bed mobility comments: Pt sat up on R side of bed with increased time. Cues for sequencing. Pt brought BLE off EOB. Assit to elevate trunk and scoot fwd til feet flat.    Transfers Overall transfer level: Needs assistance Equipment used: Rolling walker (2 wheels) Transfers: Sit to/from Stand, Bed to chair/wheelchair/BSC Sit to Stand: Min assist, +2 physical assistance, +2 safety/equipment   Step pivot transfers: Min assist, +2 physical assistance, +2 safety/equipment       General transfer comment: Pt stood from lowest bed height. Cued proper hand placement. Pt pushed up from bed with LUE. Powered up with minA x2. Increased time to steady upon obtaining erect posture. Transferred to recliner chair on right. Good eccentric control.    Ambulation/Gait Ambulation/Gait assistance: Min assist, +2 safety/equipment (Chair Follow) Gait Distance (Feet): 40 Feet (x2, standing rest break between bouts) Assistive device: Rolling walker (2 wheels) Gait Pattern/deviations: Step-through pattern, Decreased stride length, Wide base of support, Drifts right/left Gait velocity: decreased Gait velocity interpretation: <1.31 ft/sec, indicative of household ambulator   General Gait Details: Pt utilized L hand on RW and PT provided propulsion of right side of the RW. She took short, slow steps with adequate foot clearence. Unsteady throughout with postural sway and right lateral lean when advancing LEs. Distance limited d/t fatigue. Pt reports she regulates how far she walks at home d/t SOB.   Stairs             Psychologist, prison and probation services  Tilt Bed    Modified Rankin (Stroke Patients Only)       Balance Overall balance assessment: Needs assistance Sitting-balance support: Single extremity supported, Feet  supported Sitting balance-Leahy Scale: Fair Sitting balance - Comments: maintained sitting EOB with supervision Postural control: Right lateral lean Standing balance support: Bilateral upper extremity supported, During functional activity, Reliant on assistive device for balance Standing balance-Leahy Scale: Poor Standing balance comment: Pt dependent on RW and +2 assist for safety. Unsteady during dynamic tasks with right lateral lean.                            Communication Communication Communication: No apparent difficulties  Cognition Arousal: Alert Behavior During Therapy: Flat affect   PT - Cognitive impairments: Awareness, Initiation, Sequencing, Problem solving, Safety/Judgement                       PT - Cognition Comments: Pt required moderate encouragement to participate in session. Internally distracted by pain. Required re-direction to focus on task Following commands: Impaired Following commands impaired: Only follows one step commands consistently, Follows multi-step commands inconsistently, Follows multi-step commands with increased time    Cueing Cueing Techniques: Verbal cues, Gestural cues  Exercises      General Comments General comments (skin integrity, edema, etc.): VSS on RA. Husband present and supportive throughout session. Extensive pain education provide emphasizing the benefit mobility has on recovery. Encouraged pt to alternate between bed and chair.      Pertinent Vitals/Pain Pain Assessment Pain Assessment: 0-10 Pain Score: 6  Pain Location: R shoulder and R-sided flank Pain Descriptors / Indicators: Grimacing, Guarding, Discomfort, Aching Pain Intervention(s): Premedicated before session, Monitored during session, Limited activity within patient's tolerance, Repositioned    Home Living                          Prior Function            PT Goals (current goals can now be found in the care plan section) Acute  Rehab PT Goals Patient Stated Goal: Pain to be controlled so I can tolerate activity Progress towards PT goals: Progressing toward goals    Frequency    Min 2X/week      PT Plan      Co-evaluation              AM-PAC PT 6 Clicks Mobility   Outcome Measure  Help needed turning from your back to your side while in a flat bed without using bedrails?: Total Help needed moving from lying on your back to sitting on the side of a flat bed without using bedrails?: Total Help needed moving to and from a bed to a chair (including a wheelchair)?: A Lot Help needed standing up from a chair using your arms (e.g., wheelchair or bedside chair)?: A Lot Help needed to walk in hospital room?: Total Help needed climbing 3-5 steps with a railing? : Total 6 Click Score: 8    End of Session Equipment Utilized During Treatment: Gait belt Activity Tolerance: Patient tolerated treatment well;Patient limited by pain Patient left: in chair;with call bell/phone within reach;with chair alarm set;with family/visitor present Nurse Communication: Mobility status PT Visit Diagnosis: Other abnormalities of gait and mobility (R26.89);History of falling (Z91.81);Pain Pain - Right/Left: Right Pain - part of body: Shoulder;Arm     Time: 1007-1030 PT Time Calculation (min) (ACUTE ONLY): 23 min  Charges:    $Gait Training: 8-22 mins $Therapeutic Activity: 8-22 mins PT General Charges $$ ACUTE PT VISIT: 1 Visit                     Randall SAUNDERS, PT, DPT Acute Rehabilitation Services Office: [phone] Secure Chat Preferred  Delon CHRISTELLA Callander 02/16/2024, 11:40 AM

## 2024-02-17 DIAGNOSIS — S2241XD Multiple fractures of ribs, right side, subsequent encounter for fracture with routine healing: Secondary | ICD-10-CM | POA: Diagnosis not present

## 2024-02-17 LAB — CBC
HCT: 32.6 % — ABNORMAL LOW (ref 36.0–46.0)
Hemoglobin: 10.2 g/dL — ABNORMAL LOW (ref 12.0–15.0)
MCH: 26 pg (ref 26.0–34.0)
MCHC: 31.3 g/dL (ref 30.0–36.0)
MCV: 83.2 fL (ref 80.0–100.0)
Platelets: 263 K/uL (ref 150–400)
RBC: 3.92 MIL/uL (ref 3.87–5.11)
RDW: 17 % — ABNORMAL HIGH (ref 11.5–15.5)
WBC: 6.6 K/uL (ref 4.0–10.5)
nRBC: 0 % (ref 0.0–0.2)

## 2024-02-17 LAB — GLUCOSE, CAPILLARY
Glucose-Capillary: 123 mg/dL — ABNORMAL HIGH (ref 70–99)
Glucose-Capillary: 146 mg/dL — ABNORMAL HIGH (ref 70–99)
Glucose-Capillary: 144 mg/dL — ABNORMAL HIGH (ref 70–99)
Glucose-Capillary: 144 mg/dL — ABNORMAL HIGH (ref 70–99)

## 2024-02-17 LAB — BASIC METABOLIC PANEL WITH GFR
Anion gap: 7 (ref 5–15)
BUN: 12 mg/dL (ref 8–23)
CO2: 30 mmol/L (ref 22–32)
Calcium: 8.2 mg/dL — ABNORMAL LOW (ref 8.9–10.3)
Chloride: 99 mmol/L (ref 98–111)
Creatinine, Ser: 0.59 mg/dL (ref 0.44–1.00)
GFR, Estimated: >60 mL/min (ref >60)
Glucose, Bld: 138 mg/dL — ABNORMAL HIGH (ref 70–99)
Potassium: 4 mmol/L (ref 3.5–5.1)
Sodium: 136 mmol/L (ref 135–145)

## 2024-02-17 LAB — MAGNESIUM: Magnesium: 2 mg/dL (ref 1.7–2.4)

## 2024-02-17 MED ORDER — ORAL CARE MOUTH RINSE: 15.0000 mL | OROMUCOSAL | Status: DC | PRN

## 2024-02-17 NOTE — TOC Progression Note (Addendum)
 Transition of Care [facility]) - Progression Note    Patient Details  Name: Patricia Delacruz MRN: [id number] Date of Birth: 03/13/1956  [facility] CM/SW Contact  [employer] Schwanke LITTIE [place], [place] Phone Number: 02/17/2024, 3:33 PM  Clinical Narrative:    Pt chose [facility]. Auth approved, ref #[id number] effective 8/30-02/21/24. Facility liaison to confirm bed availability Monday 9/1.    Expected Discharge Plan: Home w Home Health Services Barriers to Discharge: Continued Medical Work up               Expected Discharge Plan and Services   Discharge Planning Services: CM Consult Post Acute Care Choice: Home Health Living arrangements for the past 2 months: Single Family Home                 DME Arranged: N/A         HH Arranged: PT, OT HH Agency: [name] Date [facility] Agency Contacted: 02/10/24 Time HH Agency Contacted: 1507 Representative spoke with at [facility] Agency: [name]   Social Drivers of Health (SDOH) Interventions SDOH Screenings   Food Insecurity: No Food Insecurity (02/10/2024)  Housing: Low Risk  (02/11/2024)  Transportation Needs: No Transportation Needs (02/11/2024)  Utilities: Not At Risk (02/10/2024)  Social Connections: Socially Integrated (02/10/2024)  Tobacco Use: Low Risk  (02/10/2024)    Readmission Risk Interventions     No data to display

## 2024-02-17 NOTE — Progress Notes (Signed)
 Mobility Specialist Progress Note:   02/17/24 1459  Mobility  Activity Ambulated with assistance (In hallway)  Level of Assistance Minimal assist, patient does 75% or more  Assistive Device Other (Comment) (HHA)  Distance Ambulated (ft) 80 ft  RUE Weight Bearing Per Provider Order NWB  Activity Response Tolerated well  Mobility Referral Yes  Mobility visit 1 Mobility  Mobility Specialist Start Time (ACUTE ONLY) 1410  Mobility Specialist Stop Time (ACUTE ONLY) 1420  Mobility Specialist Time Calculation (min) (ACUTE ONLY) 10 min   Received pt in chair and agreeable to mobility. Required MinA STS, otherwise contact guard. Pt c/o RUE discomfort. Returned to room without fault. Left pt in chair with personal belongings and call light within reach. All needs met.  Lavanda Pollack Mobility Specialist  Please contact via [employer] or  Rehab Office [phone]

## 2024-02-17 NOTE — Progress Notes (Signed)
 PROGRESS NOTE    Patricia Delacruz  FMW:[id number] DOB: 22-Aug-1955 DOA: 02/10/2024 PCP: Valma Carwin, MD    Brief Narrative:   68 y.o. female with medical history significant of anxiety, depression, depression, hyperlipidemia, hypertension, type 2 diabetes, obesity who had a mechanical fall at home going down the stairs.   CT showed right 1st rib fracture, right 3rd-6th rib fracture, nondisplaced fracture posterior r 2nd rib, and R clavicular fracture.   Admitted to hospitalist service with trauma surgery following.  Orthopedics recommended NWB to RUE, sling immobilization.  Trauma has now signed off.  Ongoing pain control.  PT recommending SNF.   Assessment & Plan:  Principal Problem:   Fracture of multiple ribs Active Problems:   Depression   Anxiety   Hyperlipidemia   Hypertension   Obesity (BMI 35.0-39.9 without comorbidity)   Clavicle fracture   Fall   Type 2 diabetes mellitus with hyperglycemia (HCC)   Grade I diastolic dysfunction   Rib fractures     Right Mid Clavicle Fracture Right 1st Rib Fracture  Right 3rd-6th Rib Fracture.  Nondisplaced Fracture Posterior R 2nd Rib Mechanical Fall -Appreciate ortho - Non op management of clavicle fracture - NWB to RUE, sling immobilization, ok for wrist and elbow motion as tolerated.  Ok to start gentle pendulum exercises of the shoulder.   Follow up with Dr. Kendal 2 weeks after discharge for repeat x rays. 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  Trauma now signed off - follow with Dr. Kendal in 2 weeks and with PCP in 1-2 weeks for post hospital follow up.    R apical PTX -Noted on CT Repeat x ray without PTX noted    AKI -Resolved.   Orthostatic Hypotension C/o LH/nausea with standing on 8/23 - Resolved   Physical Deconditioning -Therapy evaluation  Hypertension -Ramipril     HFpEF, EF  65%.  Mild AS -Will resume home meds upon discharge   Depression  Anxiety -Wellbutrin , cymbalta , ativan  prn, ambien  nightly   T2DM  -On home pioglitazone  - Sliding scale   GERD -PPI   Urinary Retention - [place] catheter removed 8/27   Obesity Body mass index is 34.15 kg/m.       DVT prophylaxis: lovenox  30 mg BID per trauma guidelines Code Status: full Family Communication: family at bedside Disposition:    Awaiting SNF placement   PT Follow up Recs: Skilled Nursing-Short Term Rehab (<3 Hours/Day)02/14/2024 1500  Subjective:  Pain with movement expected Daughter at bedside  Examination:  General exam: Appears calm and comfortable  Respiratory system: Clear to auscultation. Respiratory effort normal. Cardiovascular system: S1 & S2 heard, RRR. No JVD, murmurs, rubs, gallops or clicks. No pedal edema. Gastrointestinal system: Abdomen is nondistended, soft and nontender. No organomegaly or masses felt. Normal bowel sounds heard. Central nervous system: Alert and oriented. No focal neurological deficits. Extremities: Symmetric 5 x 5 power. Skin: No rashes, lesions or ulcers Psychiatry: Judgement and insight appear normal. Mood & affect appropriate.                Diet Orders (From admission, onward)     Start     Ordered   02/10/24 1255  Diet heart healthy/carb modified Room service appropriate? Yes; Fluid consistency: Thin  Diet effective now       Question Answer Comment  Diet-HS Snack? Nothing   Room service appropriate? Yes  Fluid consistency: Thin      02/10/24 1254            Objective: Vitals:   02/16/24 2011 02/17/24 0421 02/17/24 0500 02/17/24 0750  BP: (!) 141/68 (!) 128/58  125/62  Pulse: 90 81  79  Resp: 16 17  16   Temp: 98.2 F (36.8 C) 97.7 F (36.5 C)  97.8 F (36.6 C)  TempSrc: Oral Oral  Oral  SpO2: 94% 94%  93%  Weight:  114.7 kg 113.8 kg   Height:        Intake/Output Summary (Last 24 hours) at 02/17/2024  1113 Last data filed at 02/17/2024 0900 Gross per 24 hour  Intake 420 ml  Output --  Net 420 ml   Filed Weights   02/16/24 0500 02/17/24 0421 02/17/24 0500  Weight: 113.8 kg 114.7 kg 113.8 kg    Scheduled Meds:  acetaminophen   1,000 mg Oral Q6H   buPROPion   450 mg Oral Daily   Chlorhexidine  Gluconate Cloth  6 each Topical Daily   DULoxetine   30 mg Oral Daily   enoxaparin  (LOVENOX ) injection  30 mg Subcutaneous Q12H   gabapentin   100 mg Oral TID   insulin  aspart  0-9 Units Subcutaneous TID WC   lidocaine   2 patch Transdermal Q24H   methocarbamol   1,000 mg Oral TID   pantoprazole   40 mg Oral Daily   pioglitazone   30 mg Oral Daily   polyethylene glycol  17 g Oral BID   Continuous Infusions:  Nutritional status     Body mass index is 34.01 kg/m.  Data Reviewed:   CBC: Recent Labs  Lab 02/11/24 1438 02/12/24 1455 02/13/24 0713 02/16/24 0447 02/17/24 0254  WBC 11.6* 11.3* 11.5* 7.1 6.6  NEUTROABS 8.5* 8.7*  --   --   --   HGB 11.6* 11.5* 11.3* 10.4* 10.2*  HCT 37.2 36.2 36.7 33.5* 32.6*  MCV 84.5 82.1 83.4 84.2 83.2  PLT 264 254 272 245 263   Basic Metabolic Panel: Recent Labs  Lab 02/11/24 1438 02/12/24 1420 02/13/24 0713 02/16/24 0447 02/17/24 0254  NA 133* 134* 133* 134* 136  K 3.9 5.0 4.3 4.2 4.0  CL 96* 97* 100 96* 99  CO2 24 21* 24 27 30   GLUCOSE 152* 139* 165* 116* 138*  BUN 39* 32* 26* 16 12  CREATININE 2.15* 1.19* 0.83 0.61 0.59  CALCIUM  9.0 9.1 8.8* 8.2* 8.2*  MG  --   --  2.1 2.0 2.0  PHOS  --   --  3.3 2.6  --    GFR: Estimated Creatinine Clearance: 97.6 mL/min (by C-G formula based on SCr of 0.59 mg/dL). Liver Function Tests: Recent Labs  Lab 02/11/24 1438 02/12/24 1420  AST 32 26  ALT 27 22  ALKPHOS 74 82  BILITOT 0.7 0.7  PROT 6.3* 6.6  ALBUMIN 3.3* 3.1*   No results for input(s): LIPASE, AMYLASE in the last 168 hours. No results for input(s): AMMONIA in the last 168 hours. Coagulation Profile: No results for  input(s): INR, PROTIME in the last 168 hours. Cardiac Enzymes: No results for input(s): CKTOTAL, CKMB, CKMBINDEX, TROPONINI in the last 168 hours. BNP (last 3 results) No results for input(s): PROBNP in the last 8760 hours. HbA1C: No results for input(s): HGBA1C in the last 72 hours. CBG: Recent Labs  Lab 02/15/24 2008 02/16/24 0823 02/16/24 1147 02/16/24 1649 02/17/24 0843  GLUCAP 145* 123* 220* 170* 123*   Lipid Profile: No results for input(s): CHOL, HDL, LDLCALC,  TRIG, CHOLHDL, LDLDIRECT in the last 72 hours. Thyroid Function Tests: No results for input(s): TSH, T4TOTAL, FREET4, T3FREE, THYROIDAB in the last 72 hours. Anemia Panel: No results for input(s): VITAMINB12, FOLATE, FERRITIN, TIBC, IRON, RETICCTPCT in the last 72 hours. Sepsis Labs: No results for input(s): PROCALCITON, LATICACIDVEN in the last 168 hours.  No results found for this or any previous visit (from the past 240 hours).       Radiology Studies: No results found.         LOS: 5 days   Time spent= 35 mins    Burgess JAYSON Dare, MD [facility]  If 7PM-7AM, please contact night-coverage  02/17/2024, 11:13 AM

## 2024-02-17 NOTE — Progress Notes (Signed)
 Occupational Therapy Treatment Patient Details Name: Patricia Delacruz MRN: [id number] DOB: May 26, 1956 Today's Date: 02/17/2024   History of present illness Patient is a 68 y/o female admitted 02/09/24 with fall down stairs at home noted R clavicle fx, R first rib, and 3-6 rib fx.  PMH positive for DM, CIN II (cervical intraepithelial neoplasia), h/o cervical/vulvar cancer, HTN, HLD and obesity.   OT comments  Pt making good progress with functional goals. Pt reports feeling better and eager to d/c to SNF for rehab. OT will continue to follow acutely to maximize level of function and safety      If plan is discharge home, recommend the following:  A little help with walking and/or transfers;A lot of help with bathing/dressing/bathroom;Assistance with cooking/housework;Assist for transportation;Help with stairs or ramp for entrance   Equipment Recommendations  Other (comment) (defer)    Recommendations for Other Services      Precautions / Restrictions Precautions Precautions: Fall Recall of Precautions/Restrictions: Impaired Restrictions Weight Bearing Restrictions Per Provider Order: Yes RUE Weight Bearing Per Provider Order: Non weight bearing Other Position/Activity Restrictions: PA from ortho approved use of rollator for balance if tolerated without pushing weight on R hand; Okay for wrist and elbow motion as tolerated. Ok to start gentle pendulum exercises of the shoulder.       Mobility Bed Mobility Overal bed mobility: Needs Assistance Bed Mobility: Supine to Sit     Supine to sit: Supervision, HOB elevated, Used rails          Transfers Overall transfer level: Needs assistance Equipment used: 1 person hand held assist Transfers: Sit to/from Stand, Bed to chair/wheelchair/BSC Sit to Stand: Min assist, Contact guard assist     Step pivot transfers: Contact guard assist     General transfer comment: Pt walked to bathroom for toileting and grooming/hygiene tasks at  sink     Balance Overall balance assessment: Needs assistance Sitting-balance support: Single extremity supported, Feet supported Sitting balance-Leahy Scale: Fair Sitting balance - Comments: maintained sitting EOB with supervision   Standing balance support: Bilateral upper extremity supported, During functional activity, Reliant on assistive device for balance Standing balance-Leahy Scale: Poor                             ADL either performed or assessed with clinical judgement   ADL Overall ADL's : Needs assistance/impaired     Grooming: Wash/dry hands;Wash/dry face;Oral care;Minimal assistance;Standing           Upper Body Dressing : Moderate assistance;Sitting       Toilet Transfer: Minimal assistance;Contact guard assist;Ambulation;Regular Toilet;Grab bars;Cueing for safety   Toileting- Clothing Manipulation and Hygiene: Contact guard assist;Sit to/from stand       Functional mobility during ADLs: Minimal assistance;Contact guard assist;Cueing for safety      Extremity/Trunk Assessment Upper Extremity Assessment Upper Extremity Assessment: Generalized weakness;RUE deficits/detail RUE Deficits / Details: not tolerating attempt for pendulums this date. Limited elbow ROM due to pain exacerbation.            Vision Baseline Vision/History: 0 No visual deficits Ability to See in Adequate Light: 0 Adequate     Perception     Praxis     Communication Communication Communication: No apparent difficulties   Cognition Arousal: Alert Behavior During Therapy: WFL for tasks assessed/performed Cognition: No apparent impairments  Following commands: Impaired Following commands impaired: Only follows one step commands consistently, Follows multi-step commands inconsistently, Follows multi-step commands with increased time      Cueing   Cueing Techniques: Verbal cues, Gestural cues  Exercises       Shoulder Instructions       General Comments      Pertinent Vitals/ Pain       Pain Assessment Pain Assessment: 0-10 Pain Score: 5  Pain Location: R shoulder and R-sided flank Pain Descriptors / Indicators: Grimacing, Guarding, Discomfort, Aching Pain Intervention(s): Limited activity within patient's tolerance, Premedicated before session, Repositioned, Monitored during session  Home Living                                          Prior Functioning/Environment              Frequency  Min 2X/week        Progress Toward Goals  OT Goals(current goals can now be found in the care plan section)  Progress towards OT goals: Progressing toward goals     Plan      Co-evaluation                 AM-PAC OT 6 Clicks Daily Activity     Outcome Measure   Help from another person eating meals?: None Help from another person taking care of personal grooming?: A Little Help from another person toileting, which includes using toliet, bedpan, or urinal?: A Little Help from another person bathing (including washing, rinsing, drying)?: A Lot Help from another person to put on and taking off regular upper body clothing?: A Lot Help from another person to put on and taking off regular lower body clothing?: A Lot 6 Click Score: 16    End of Session Equipment Utilized During Treatment: Gait belt  OT Visit Diagnosis: Unsteadiness on feet (R26.81);Other abnormalities of gait and mobility (R26.89);Muscle weakness (generalized) (M62.81);Pain;History of falling (Z91.81) Pain - Right/Left: Right Pain - part of body: Shoulder;Arm   Activity Tolerance Patient tolerated treatment well   Patient Left in chair;with call bell/phone within reach;with family/visitor present   Nurse Communication Mobility status        Time: 8784-8751 OT Time Calculation (min): 33 min  Charges: OT General Charges $OT Visit: 1 Visit OT Treatments $Self Care/Home  Management : 8-22 mins $Therapeutic Activity: 8-22 mins    Jacques Karna Loose 02/17/2024, 2:02 PM

## 2024-02-18 DIAGNOSIS — S2241XD Multiple fractures of ribs, right side, subsequent encounter for fracture with routine healing: Secondary | ICD-10-CM | POA: Diagnosis not present

## 2024-02-18 LAB — CBC
HCT: 35.7 % — ABNORMAL LOW (ref 36.0–46.0)
Hemoglobin: 10.9 g/dL — ABNORMAL LOW (ref 12.0–15.0)
MCH: 26 pg (ref 26.0–34.0)
MCHC: 30.5 g/dL (ref 30.0–36.0)
MCV: 85.2 fL (ref 80.0–100.0)
Platelets: 258 K/uL (ref 150–400)
RBC: 4.19 MIL/uL (ref 3.87–5.11)
RDW: 17.2 % — ABNORMAL HIGH (ref 11.5–15.5)
WBC: 6.7 K/uL (ref 4.0–10.5)
nRBC: 0 % (ref 0.0–0.2)

## 2024-02-18 LAB — GLUCOSE, CAPILLARY
Glucose-Capillary: 141 mg/dL — ABNORMAL HIGH (ref 70–99)
Glucose-Capillary: 199 mg/dL — ABNORMAL HIGH (ref 70–99)
Glucose-Capillary: 143 mg/dL — ABNORMAL HIGH (ref 70–99)
Glucose-Capillary: 153 mg/dL — ABNORMAL HIGH (ref 70–99)

## 2024-02-18 LAB — BASIC METABOLIC PANEL WITH GFR
Anion gap: 9 (ref 5–15)
BUN: 10 mg/dL (ref 8–23)
CO2: 27 mmol/L (ref 22–32)
Calcium: 8.1 mg/dL — ABNORMAL LOW (ref 8.9–10.3)
Chloride: 101 mmol/L (ref 98–111)
Creatinine, Ser: 0.67 mg/dL (ref 0.44–1.00)
GFR, Estimated: >60 mL/min (ref >60)
Glucose, Bld: 148 mg/dL — ABNORMAL HIGH (ref 70–99)
Potassium: 3.8 mmol/L (ref 3.5–5.1)
Sodium: 137 mmol/L (ref 135–145)

## 2024-02-18 LAB — MAGNESIUM: Magnesium: 1.9 mg/dL (ref 1.7–2.4)

## 2024-02-18 MED ORDER — OXYCODONE HCL ER 10 MG PO T12A: 10.0000 mg | EXTENDED_RELEASE_TABLET | Freq: Two times a day (BID) | ORAL | 0 refills | Status: DC

## 2024-02-18 MED ORDER — OXYCODONE HCL ER 10 MG PO T12A
10.0000 mg | EXTENDED_RELEASE_TABLET | Freq: Two times a day (BID) | ORAL | Status: DC
  Administered 2024-02-18 – 2024-02-21 (×7): 10 mg via ORAL
  Filled 2024-02-18 (×7): qty 1

## 2024-02-18 MED ORDER — BISACODYL 5 MG PO TBEC
10.0000 mg | DELAYED_RELEASE_TABLET | Freq: Every day | ORAL | Status: DC
  Administered 2024-02-18 – 2024-02-20 (×3): 10 mg via ORAL
  Filled 2024-02-18 (×3): qty 2

## 2024-02-18 NOTE — Progress Notes (Signed)
 PROGRESS NOTE    Patricia Delacruz  FMW:[id number] DOB: [birth year] DOA: 02/10/2024 PCP: Valma Carwin, MD    Brief Narrative:   68 y.o. female with medical history significant of anxiety, depression, depression, hyperlipidemia, hypertension, type 2 diabetes, obesity who had a mechanical fall at home going down the stairs.   CT showed right 1st rib fracture, right 3rd-6th rib fracture, nondisplaced fracture posterior r 2nd rib, and R clavicular fracture.   Admitted to hospitalist service with trauma surgery following.  Orthopedics recommended NWB to RUE, sling immobilization.  Trauma has now signed off.  Ongoing pain control.  PT recommending SNF.   Assessment & Plan:  Principal Problem:   Fracture of multiple ribs Active Problems:   Depression   Anxiety   Hyperlipidemia   Hypertension   Obesity (BMI 35.0-39.9 without comorbidity)   Clavicle fracture   Fall   Type 2 diabetes mellitus with hyperglycemia (HCC)   Grade I diastolic dysfunction   Rib fractures     Right Mid Clavicle Fracture Right 1st Rib Fracture  Right 3rd-6th Rib Fracture.  Nondisplaced Fracture Posterior R 2nd Rib Mechanical Fall -Appreciate ortho - Non op management of clavicle fracture - NWB to RUE, sling immobilization, ok for wrist and elbow motion as tolerated.  Ok to start gentle pendulum exercises of the shoulder.   Follow up with Dr. Kendal 2 weeks after discharge for repeat x rays. 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  Trauma now signed off - follow with Dr. Kendal in 2 weeks and with PCP in 1-2 weeks for post hospital follow up.  -Will continue adjust her pain regimen as appropriate.  Bowel regimen.   R apical PTX -Noted on CT Repeat x ray without PTX noted    AKI -Resolved.   Orthostatic Hypotension C/o LH/nausea with standing on 8/23 - Resolved   Physical  Deconditioning -Therapy evaluation  Hypertension -Ramipril     HFpEF, EF 65%.  Mild AS -Will resume home meds upon discharge   Depression  Anxiety -Wellbutrin , cymbalta , ativan  prn, ambien  nightly   T2DM  -On home pioglitazone  - Sliding scale   GERD -PPI   Urinary Retention - [place] catheter removed 8/27.  Now dribbling urine every hour therefore we will repeat bladder scan.   Obesity Body mass index is 34.15 kg/m.       DVT prophylaxis: lovenox  30 mg BID per trauma guidelines Code Status: full Family Communication: family at bedside Disposition:    Awaiting SNF placement   PT Follow up Recs: Skilled Nursing-Short Term Rehab (<3 Hours/Day)02/14/2024 1500  Subjective: Seen at bedside.  She is reporting of some urinary incontinence Also requesting to adjust her pain regimen and increasing it. Daughter present at bedside   Examination:  General exam: Appears calm and comfortable  Respiratory system: Clear to auscultation. Respiratory effort normal. Cardiovascular system: S1 & S2 heard, RRR. No JVD, murmurs, rubs, gallops or clicks. No pedal edema. Gastrointestinal system: Abdomen is nondistended, soft and nontender. No organomegaly or masses felt. Normal bowel sounds heard. Central nervous system: Alert and oriented. No focal neurological deficits. Extremities: Symmetric 5 x 5 power. Skin: No rashes, lesions or ulcers Psychiatry: Judgement and insight appear normal. Mood & affect appropriate.                Diet Orders (From admission, onward)     Start     Ordered  02/10/24 1255  Diet heart healthy/carb modified Room service appropriate? Yes; Fluid consistency: Thin  Diet effective now       Question Answer Comment  Diet-HS Snack? Nothing   Room service appropriate? Yes   Fluid consistency: Thin      02/10/24 1254            Objective: Vitals:   02/17/24 2008 02/18/24 0443 02/18/24 0500 02/18/24 0740  BP: (!) 122/45 129/62  (!)  137/52  Pulse: 92 76  87  Resp: 16 16  16   Temp: 98.2 F (36.8 C) 97.6 F (36.4 C)  97.7 F (36.5 C)  TempSrc: Oral Oral  Oral  SpO2: 95% 97%  97%  Weight:   114 kg   Height:        Intake/Output Summary (Last 24 hours) at 02/18/2024 1009 Last data filed at 02/17/2024 2200 Gross per 24 hour  Intake 480 ml  Output --  Net 480 ml   Filed Weights   02/17/24 0421 02/17/24 0500 02/18/24 0500  Weight: 114.7 kg 113.8 kg 114 kg    Scheduled Meds:  acetaminophen   1,000 mg Oral Q6H   bisacodyl   10 mg Oral Q1500   buPROPion   450 mg Oral Daily   Chlorhexidine  Gluconate Cloth  6 each Topical Daily   DULoxetine   30 mg Oral Daily   enoxaparin  (LOVENOX ) injection  30 mg Subcutaneous Q12H   gabapentin   100 mg Oral TID   insulin  aspart  0-9 Units Subcutaneous TID WC   lidocaine   2 patch Transdermal Q24H   methocarbamol   1,000 mg Oral TID   oxyCODONE   10 mg Oral Q12H   pantoprazole   40 mg Oral Daily   pioglitazone   30 mg Oral Daily   polyethylene glycol  17 g Oral BID   Continuous Infusions:  Nutritional status     Body mass index is 34.09 kg/m.  Data Reviewed:   CBC: Recent Labs  Lab 02/11/24 1438 02/12/24 1455 02/13/24 0713 02/16/24 0447 02/17/24 0254 02/18/24 0804  WBC 11.6* 11.3* 11.5* 7.1 6.6 6.7  NEUTROABS 8.5* 8.7*  --   --   --   --   HGB 11.6* 11.5* 11.3* 10.4* 10.2* 10.9*  HCT 37.2 36.2 36.7 33.5* 32.6* 35.7*  MCV 84.5 82.1 83.4 84.2 83.2 85.2  PLT 264 254 272 245 263 258   Basic Metabolic Panel: Recent Labs  Lab 02/12/24 1420 02/13/24 0713 02/16/24 0447 02/17/24 0254 02/18/24 0804  NA 134* 133* 134* 136 137  K 5.0 4.3 4.2 4.0 3.8  CL 97* 100 96* 99 101  CO2 21* 24 27 30 27   GLUCOSE 139* 165* 116* 138* 148*  BUN 32* 26* 16 12 10   CREATININE 1.19* 0.83 0.61 0.59 0.67  CALCIUM  9.1 8.8* 8.2* 8.2* 8.1*  MG  --  2.1 2.0 2.0 1.9  PHOS  --  3.3 2.6  --   --    GFR: Estimated Creatinine Clearance: 97.7 mL/min (by C-G formula based on SCr of 0.67  mg/dL). Liver Function Tests: Recent Labs  Lab 02/11/24 1438 02/12/24 1420  AST 32 26  ALT 27 22  ALKPHOS 74 82  BILITOT 0.7 0.7  PROT 6.3* 6.6  ALBUMIN 3.3* 3.1*   No results for input(s): LIPASE, AMYLASE in the last 168 hours. No results for input(s): AMMONIA in the last 168 hours. Coagulation Profile: No results for input(s): INR, PROTIME in the last 168 hours. Cardiac Enzymes: No results for input(s): CKTOTAL, CKMB, CKMBINDEX, TROPONINI in  the last 168 hours. BNP (last 3 results) No results for input(s): PROBNP in the last 8760 hours. HbA1C: No results for input(s): HGBA1C in the last 72 hours. CBG: Recent Labs  Lab 02/17/24 0843 02/17/24 1140 02/17/24 1612 02/17/24 2006 02/18/24 0759  GLUCAP 123* 146* 144* 144* 141*   Lipid Profile: No results for input(s): CHOL, HDL, LDLCALC, TRIG, CHOLHDL, LDLDIRECT in the last 72 hours. Thyroid Function Tests: No results for input(s): TSH, T4TOTAL, FREET4, T3FREE, THYROIDAB in the last 72 hours. Anemia Panel: No results for input(s): VITAMINB12, FOLATE, FERRITIN, TIBC, IRON, RETICCTPCT in the last 72 hours. Sepsis Labs: No results for input(s): PROCALCITON, LATICACIDVEN in the last 168 hours.  No results found for this or any previous visit (from the past 240 hours).       Radiology Studies: No results found.         LOS: 6 days   Time spent= 35 mins    Burgess JAYSON Dare, MD [facility]  If 7PM-7AM, please contact night-coverage  02/18/2024, 10:09 AM

## 2024-02-18 NOTE — Plan of Care (Signed)
  Problem: Education: Goal: Ability to describe self-care measures that may prevent or decrease complications (Diabetes Survival Skills Education) will improve Outcome: Progressing   Problem: Skin Integrity: Goal: Risk for impaired skin integrity will decrease Outcome: Progressing   Problem: Clinical Measurements: Goal: Ability to maintain clinical measurements within normal limits will improve Outcome: Progressing   Problem: Activity: Goal: Risk for activity intolerance will decrease Outcome: Progressing   Problem: Nutrition: Goal: Adequate nutrition will be maintained Outcome: Progressing   Problem: Elimination: Goal: Will not experience complications related to bowel motility Outcome: Progressing Goal: Will not experience complications related to urinary retention Outcome: Progressing   Problem: Pain Managment: Goal: General experience of comfort will improve and/or be controlled Outcome: Progressing   Problem: Safety: Goal: Ability to remain free from injury will improve Outcome: Progressing

## 2024-02-18 NOTE — Plan of Care (Signed)
  Problem: Education: Goal: Ability to describe self-care measures that may prevent or decrease complications (Diabetes Survival Skills Education) will improve Outcome: Progressing Goal: Individualized Educational Video(s) Outcome: Progressing   Problem: Coping: Goal: Ability to adjust to condition or change in health will improve Outcome: Progressing   Problem: Fluid Volume: Goal: Ability to maintain a balanced intake and output will improve Outcome: Progressing   Problem: Health Behavior/Discharge Planning: Goal: Ability to identify and utilize available resources and services will improve Outcome: Progressing Goal: Ability to manage health-related needs will improve Outcome: Progressing   Problem: Nutritional: Goal: Maintenance of adequate nutrition will improve Outcome: Progressing Goal: Progress toward achieving an optimal weight will improve Outcome: Progressing   Problem: Metabolic: Goal: Ability to maintain appropriate glucose levels will improve Outcome: Progressing   Problem: Skin Integrity: Goal: Risk for impaired skin integrity will decrease Outcome: Progressing   Problem: Tissue Perfusion: Goal: Adequacy of tissue perfusion will improve Outcome: Progressing   Problem: Education: Goal: Knowledge of General Education information will improve Description: Including pain rating scale, medication(s)/side effects and non-pharmacologic comfort measures Outcome: Progressing   Problem: Health Behavior/Discharge Planning: Goal: Ability to manage health-related needs will improve Outcome: Progressing   Problem: Clinical Measurements: Goal: Ability to maintain clinical measurements within normal limits will improve Outcome: Progressing Goal: Will remain free from infection Outcome: Progressing Goal: Diagnostic test results will improve Outcome: Progressing Goal: Respiratory complications will improve Outcome: Progressing Goal: Cardiovascular complication will  be avoided Outcome: Progressing

## 2024-02-19 DIAGNOSIS — S2241XD Multiple fractures of ribs, right side, subsequent encounter for fracture with routine healing: Secondary | ICD-10-CM | POA: Diagnosis not present

## 2024-02-19 LAB — BASIC METABOLIC PANEL WITH GFR
Anion gap: 9 (ref 5–15)
BUN: 11 mg/dL (ref 8–23)
CO2: 28 mmol/L (ref 22–32)
Calcium: 8.1 mg/dL — ABNORMAL LOW (ref 8.9–10.3)
Chloride: 101 mmol/L (ref 98–111)
Creatinine, Ser: 0.63 mg/dL (ref 0.44–1.00)
GFR, Estimated: >60 mL/min (ref >60)
Glucose, Bld: 119 mg/dL — ABNORMAL HIGH (ref 70–99)
Potassium: 3.8 mmol/L (ref 3.5–5.1)
Sodium: 138 mmol/L (ref 135–145)

## 2024-02-19 LAB — CBC
HCT: 33.7 % — ABNORMAL LOW (ref 36.0–46.0)
Hemoglobin: 10.4 g/dL — ABNORMAL LOW (ref 12.0–15.0)
MCH: 26.1 pg (ref 26.0–34.0)
MCHC: 30.9 g/dL (ref 30.0–36.0)
MCV: 84.5 fL (ref 80.0–100.0)
Platelets: 256 K/uL (ref 150–400)
RBC: 3.99 MIL/uL (ref 3.87–5.11)
RDW: 17.2 % — ABNORMAL HIGH (ref 11.5–15.5)
WBC: 6.6 K/uL (ref 4.0–10.5)
nRBC: 0 % (ref 0.0–0.2)

## 2024-02-19 LAB — GLUCOSE, CAPILLARY
Glucose-Capillary: 170 mg/dL — ABNORMAL HIGH (ref 70–99)
Glucose-Capillary: 157 mg/dL — ABNORMAL HIGH (ref 70–99)
Glucose-Capillary: 154 mg/dL — ABNORMAL HIGH (ref 70–99)
Glucose-Capillary: 168 mg/dL — ABNORMAL HIGH (ref 70–99)

## 2024-02-19 LAB — MAGNESIUM: Magnesium: 2.1 mg/dL (ref 1.7–2.4)

## 2024-02-19 MED ORDER — MAGNESIUM CITRATE PO SOLN
1.0000 | Freq: Once | ORAL | Status: AC
  Administered 2024-02-19: 1 via ORAL
  Filled 2024-02-19: qty 296

## 2024-02-19 NOTE — Progress Notes (Signed)
 PROGRESS NOTE    Patricia Delacruz  FMW:[id number] DOB: May 23, 1956 DOA: 02/10/2024 PCP: Valma Carwin, MD    Brief Narrative:   68 y.o. female with medical history significant of anxiety, depression, depression, hyperlipidemia, hypertension, type 2 diabetes, obesity who had a mechanical fall at home going down the stairs.   CT showed right 1st rib fracture, right 3rd-6th rib fracture, nondisplaced fracture posterior r 2nd rib, and R clavicular fracture.   Admitted to hospitalist service with trauma surgery following.  Orthopedics recommended NWB to RUE, sling immobilization.  Trauma has now signed off.  Ongoing pain control.  PT recommending SNF.   Assessment & Plan:  Principal Problem:   Fracture of multiple ribs Active Problems:   Depression   Anxiety   Hyperlipidemia   Hypertension   Obesity (BMI 35.0-39.9 without comorbidity)   Clavicle fracture   Fall   Type 2 diabetes mellitus with hyperglycemia (HCC)   Grade I diastolic dysfunction   Rib fractures     Right Mid Clavicle Fracture Right 1st Rib Fracture  Right 3rd-6th Rib Fracture.  Nondisplaced Fracture Posterior R 2nd Rib Mechanical Fall -Appreciate ortho - Non op management of clavicle fracture - NWB to RUE, sling immobilization, ok for wrist and elbow motion as tolerated.  Ok to start gentle pendulum exercises of the shoulder.   Follow up with Dr. Kendal 2 weeks after discharge for repeat x rays. 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  Trauma now signed off - follow with Dr. Kendal in 2 weeks and with PCP in 1-2 weeks for post hospital follow up.  -Will continue adjust her pain regimen as appropriate.  Bowel regimen.   R apical PTX -Noted on CT Repeat x ray without PTX noted    AKI -Resolved.   Orthostatic Hypotension C/o LH/nausea with standing on 8/23 - Resolved   Physical  Deconditioning -Therapy evaluation  Hypertension -Ramipril     HFpEF, EF 65%.  Mild AS -Will resume home meds upon discharge   Depression  Anxiety -Wellbutrin , cymbalta , ativan  prn, ambien  nightly   T2DM  -On home pioglitazone  - Sliding scale   GERD -PPI   Urinary Retention - [place] catheter removed 8/27.  Now dribbling urine every hour therefore we will repeat bladder scan.   Obesity Body mass index is 34.15 kg/m.       DVT prophylaxis: lovenox  30 mg BID per trauma guidelines Code Status: full Family Communication: family at bedside Disposition:    Awaiting SNF placement   PT Follow up Recs: Skilled Nursing-Short Term Rehab (<3 Hours/Day)02/14/2024 1500  Subjective: Seen at bedside no complaints.  Has not had a bowel movement yet   Examination:  General exam: Appears calm and comfortable  Respiratory system: Clear to auscultation. Respiratory effort normal. Cardiovascular system: S1 & S2 heard, RRR. No JVD, murmurs, rubs, gallops or clicks. No pedal edema. Gastrointestinal system: Abdomen is nondistended, soft and nontender. No organomegaly or masses felt. Normal bowel sounds heard. Central nervous system: Alert and oriented. No focal neurological deficits. Extremities: Symmetric 5 x 5 power. Skin: No rashes, lesions or ulcers Psychiatry: Judgement and insight appear normal. Mood & affect appropriate.                Diet Orders (From admission, onward)     Start     Ordered   02/10/24 1255  Diet heart healthy/carb modified Room service appropriate?  Yes; Fluid consistency: Thin  Diet effective now       Question Answer Comment  Diet-HS Snack? Nothing   Room service appropriate? Yes   Fluid consistency: Thin      02/10/24 1254            Objective: Vitals:   02/18/24 1600 02/18/24 1954 02/19/24 0400 02/19/24 0759  BP: (!) 151/70 134/63 (!) 137/51 (!) 122/58  Pulse: 81 83 72 71  Resp: 16  18 17   Temp: 98.5 F (36.9 C) 97.9 F (36.6  C) 98.4 F (36.9 C) 98.4 F (36.9 C)  TempSrc: Oral Oral Oral Oral  SpO2: 98% 99% 96% 93%  Weight:      Height:        Intake/Output Summary (Last 24 hours) at 02/19/2024 0956 Last data filed at 02/19/2024 0554 Gross per 24 hour  Intake --  Output 525 ml  Net -525 ml   Filed Weights   02/17/24 0421 02/17/24 0500 02/18/24 0500  Weight: 114.7 kg 113.8 kg 114 kg    Scheduled Meds:  acetaminophen   1,000 mg Oral Q6H   bisacodyl   10 mg Oral Q1500   buPROPion   450 mg Oral Daily   Chlorhexidine  Gluconate Cloth  6 each Topical Daily   DULoxetine   30 mg Oral Daily   enoxaparin  (LOVENOX ) injection  30 mg Subcutaneous Q12H   gabapentin   100 mg Oral TID   insulin  aspart  0-9 Units Subcutaneous TID WC   lidocaine   2 patch Transdermal Q24H   magnesium  citrate  1 Bottle Oral Once   methocarbamol   1,000 mg Oral TID   oxyCODONE   10 mg Oral Q12H   pantoprazole   40 mg Oral Daily   pioglitazone   30 mg Oral Daily   polyethylene glycol  17 g Oral BID   Continuous Infusions:  Nutritional status     Body mass index is 34.09 kg/m.  Data Reviewed:   CBC: Recent Labs  Lab 02/12/24 1455 02/13/24 0713 02/16/24 0447 02/17/24 0254 02/18/24 0804 02/19/24 0523  WBC 11.3* 11.5* 7.1 6.6 6.7 6.6  NEUTROABS 8.7*  --   --   --   --   --   HGB 11.5* 11.3* 10.4* 10.2* 10.9* 10.4*  HCT 36.2 36.7 33.5* 32.6* 35.7* 33.7*  MCV 82.1 83.4 84.2 83.2 85.2 84.5  PLT 254 272 245 263 258 256   Basic Metabolic Panel: Recent Labs  Lab 02/13/24 0713 02/16/24 0447 02/17/24 0254 02/18/24 0804 02/19/24 0523  NA 133* 134* 136 137 138  K 4.3 4.2 4.0 3.8 3.8  CL 100 96* 99 101 101  CO2 24 27 30 27 28   GLUCOSE 165* 116* 138* 148* 119*  BUN 26* 16 12 10 11   CREATININE 0.83 0.61 0.59 0.67 0.63  CALCIUM  8.8* 8.2* 8.2* 8.1* 8.1*  MG 2.1 2.0 2.0 1.9 2.1  PHOS 3.3 2.6  --   --   --    GFR: Estimated Creatinine Clearance: 97.7 mL/min (by C-G formula based on SCr of 0.63 mg/dL). Liver Function  Tests: Recent Labs  Lab 02/12/24 1420  AST 26  ALT 22  ALKPHOS 82  BILITOT 0.7  PROT 6.6  ALBUMIN 3.1*   No results for input(s): LIPASE, AMYLASE in the last 168 hours. No results for input(s): AMMONIA in the last 168 hours. Coagulation Profile: No results for input(s): INR, PROTIME in the last 168 hours. Cardiac Enzymes: No results for input(s): CKTOTAL, CKMB, CKMBINDEX, TROPONINI in the last 168 hours. BNP (last  3 results) No results for input(s): PROBNP in the last 8760 hours. HbA1C: No results for input(s): HGBA1C in the last 72 hours. CBG: Recent Labs  Lab 02/18/24 0759 02/18/24 1155 02/18/24 1642 02/18/24 1957 02/19/24 0758  GLUCAP 141* 199* 143* 153* 170*   Lipid Profile: No results for input(s): CHOL, HDL, LDLCALC, TRIG, CHOLHDL, LDLDIRECT in the last 72 hours. Thyroid Function Tests: No results for input(s): TSH, T4TOTAL, FREET4, T3FREE, THYROIDAB in the last 72 hours. Anemia Panel: No results for input(s): VITAMINB12, FOLATE, FERRITIN, TIBC, IRON, RETICCTPCT in the last 72 hours. Sepsis Labs: No results for input(s): PROCALCITON, LATICACIDVEN in the last 168 hours.  No results found for this or any previous visit (from the past 240 hours).       Radiology Studies: No results found.         LOS: 7 days   Time spent= 35 mins    Patricia JAYSON Dare, MD [facility]  If 7PM-7AM, please contact night-coverage  02/19/2024, 9:56 AM

## 2024-02-19 NOTE — TOC Progression Note (Addendum)
 Transition of Care [facility]) - Progression Note    Patient Details  Name: Patricia Delacruz MRN: [id number] Date of Birth: 09-29-1955  [facility] CM/SW Contact  Isaiah Public, LCSWA Phone Number: 02/19/2024, 1:46 PM  Clinical Narrative:     Weekday CSW to follow up on bed availability on Monday with [facility]. Insurance authorization for SNF approved through 9/2.  Expected Discharge Plan: Home w Home Health Services Barriers to Discharge: Continued Medical Work up               Expected Discharge Plan and Services   Discharge Planning Services: CM Consult Post Acute Care Choice: Home Health Living arrangements for the past 2 months: Single Family Home                 DME Arranged: N/A         HH Arranged: PT, OT HH Agency: [name] Date [facility] Agency Contacted: 02/10/24 Time HH Agency Contacted: 1507 Representative spoke with at [facility] Agency: [name]   Social Drivers of Health (SDOH) Interventions SDOH Screenings   Food Insecurity: No Food Insecurity (02/10/2024)  Housing: Low Risk  (02/11/2024)  Transportation Needs: No Transportation Needs (02/11/2024)  Utilities: Not At Risk (02/10/2024)  Social Connections: Socially Integrated (02/10/2024)  Tobacco Use: Low Risk  (02/10/2024)    Readmission Risk Interventions     No data to display

## 2024-02-19 NOTE — Plan of Care (Signed)
  Problem: Education: Goal: Ability to describe self-care measures that may prevent or decrease complications (Diabetes Survival Skills Education) will improve Outcome: Progressing   Problem: Coping: Goal: Ability to adjust to condition or change in health will improve Outcome: Progressing   Problem: Nutritional: Goal: Maintenance of adequate nutrition will improve Outcome: Progressing   Problem: Activity: Goal: Risk for activity intolerance will decrease Outcome: Progressing   Problem: Pain Managment: Goal: General experience of comfort will improve and/or be controlled Outcome: Progressing   Problem: Safety: Goal: Ability to remain free from injury will improve Outcome: Progressing

## 2024-02-19 NOTE — Plan of Care (Signed)
  Problem: Education: Goal: Ability to describe self-care measures that may prevent or decrease complications (Diabetes Survival Skills Education) will improve Outcome: Progressing   Problem: Coping: Goal: Ability to adjust to condition or change in health will improve Outcome: Progressing   Problem: Nutritional: Goal: Maintenance of adequate nutrition will improve Outcome: Progressing

## 2024-02-19 NOTE — Progress Notes (Signed)
   02/19/24 1212  Mobility  Activity Ambulated with assistance  Level of Assistance Minimal assist, patient does 75% or more  Assistive Device  (HHA)  Distance Ambulated (ft) 75 ft  RUE Weight Bearing Per Provider Order NWB  Activity Response Tolerated fair  Mobility Referral Yes  Mobility visit 1 Mobility  Mobility Specialist Start Time (ACUTE ONLY) 1212  Mobility Specialist Stop Time (ACUTE ONLY) 1232  Mobility Specialist Time Calculation (min) (ACUTE ONLY) 20 min   Mobility Specialist: Progress Note  Pt agreeable to mobility session - received in bed. C/o soreness in R clavicle and upper ribs. Returned to chair with all needs met - call bell within reach.   Patricia Delacruz, BS Mobility Specialist Please contact via [employer] or  Rehab office at [phone].

## 2024-02-20 DIAGNOSIS — S2241XD Multiple fractures of ribs, right side, subsequent encounter for fracture with routine healing: Secondary | ICD-10-CM | POA: Diagnosis not present

## 2024-02-20 LAB — BASIC METABOLIC PANEL WITH GFR
Anion gap: 12 (ref 5–15)
BUN: 13 mg/dL (ref 8–23)
CO2: 28 mmol/L (ref 22–32)
Calcium: 8.4 mg/dL — ABNORMAL LOW (ref 8.9–10.3)
Chloride: 102 mmol/L (ref 98–111)
Creatinine, Ser: 0.62 mg/dL (ref 0.44–1.00)
GFR, Estimated: >60 mL/min (ref >60)
Glucose, Bld: 123 mg/dL — ABNORMAL HIGH (ref 70–99)
Potassium: 4.1 mmol/L (ref 3.5–5.1)
Sodium: 142 mmol/L (ref 135–145)

## 2024-02-20 LAB — CBC
HCT: 34 % — ABNORMAL LOW (ref 36.0–46.0)
Hemoglobin: 10.6 g/dL — ABNORMAL LOW (ref 12.0–15.0)
MCH: 26.2 pg (ref 26.0–34.0)
MCHC: 31.2 g/dL (ref 30.0–36.0)
MCV: 84.2 fL (ref 80.0–100.0)
Platelets: 253 K/uL (ref 150–400)
RBC: 4.04 MIL/uL (ref 3.87–5.11)
RDW: 17.3 % — ABNORMAL HIGH (ref 11.5–15.5)
WBC: 6.5 K/uL (ref 4.0–10.5)
nRBC: 0 % (ref 0.0–0.2)

## 2024-02-20 LAB — GLUCOSE, CAPILLARY
Glucose-Capillary: 138 mg/dL — ABNORMAL HIGH (ref 70–99)
Glucose-Capillary: 210 mg/dL — ABNORMAL HIGH (ref 70–99)
Glucose-Capillary: 125 mg/dL — ABNORMAL HIGH (ref 70–99)
Glucose-Capillary: 184 mg/dL — ABNORMAL HIGH (ref 70–99)

## 2024-02-20 LAB — MAGNESIUM: Magnesium: 2.2 mg/dL (ref 1.7–2.4)

## 2024-02-20 NOTE — Progress Notes (Signed)
 Physical Therapy Treatment Patient Details Name: Patricia Delacruz MRN: [id number] DOB: 1956-05-22 Today's Date: 02/20/2024   History of Present Illness Patient is a 68 y/o female admitted 02/09/24 with fall down stairs at home noted R clavicle fx, R first rib, and 3-6 rib fx.  PMH positive for DM, CIN II (cervical intraepithelial neoplasia), h/o cervical/vulvar cancer, HTN, HLD and obesity.    PT Comments  Pt up in chair on arrival and agreeable to session. Pt demonstrating continued progress towards acute goals, however continues to be limited in safe mobility by poor balance/postural reactions, pain, and decreased activity tolerance. Pt performing x2 gait bouts with HHA x1 and QC use respectively with min A throughout to maintain balance as pt with increased lateral postural sway and x2 mild LOB needing assist to prevent falling. Continued education on importance of frequent mobility with pt verbalizing understanding. Pt continues to benefit from skilled PT services to progress toward functional mobility goals.     If plan is discharge home, recommend the following: A little help with walking and/or transfers;A little help with bathing/dressing/bathroom;Help with stairs or ramp for entrance;Assistance with cooking/housework;Assist for transportation   Can travel by private vehicle     No  Equipment Recommendations  Hospital bed    Recommendations for Other Services       Precautions / Restrictions Precautions Precautions: Fall Recall of Precautions/Restrictions: Impaired Required Braces or Orthoses: Sling Restrictions Weight Bearing Restrictions Per Provider Order: Yes RUE Weight Bearing Per Provider Order: Non weight bearing Other Position/Activity Restrictions: PA from ortho approved use of rollator for balance if tolerated without pushing weight on R hand; Okay for wrist and elbow motion as tolerated. Ok to start gentle pendulum exercises of the shoulder.     Mobility  Bed  Mobility Overal bed mobility: Needs Assistance             General bed mobility comments: pt up in recliner on arrival    Transfers Overall transfer level: Needs assistance Equipment used: 1 person hand held assist, Quad cane Transfers: Sit to/from Stand, Bed to chair/wheelchair/BSC Sit to Stand: Contact guard assist           General transfer comment: CGA to rise from recliner without unilateral UE use, able to rise and then take QC on second attempt    Ambulation/Gait Ambulation/Gait assistance: Min assist Gait Distance (Feet): 100 Feet (+ 50') Assistive device: 1 person hand held assist, Quad cane Gait Pattern/deviations: Step-through pattern, Decreased stride length, Wide base of support, Drifts right/left Gait velocity: decreased     General Gait Details: pt ambualting with HHA on L for inital distance, agreebale to trial QC, min A to maintain balance throughout and cues for sequencing with QC and LEs   Stairs             Wheelchair Mobility     Tilt Bed    Modified Rankin (Stroke Patients Only)       Balance Overall balance assessment: Needs assistance Sitting-balance support: Single extremity supported, Feet supported Sitting balance-Leahy Scale: Fair Sitting balance - Comments: maintained sitting EOB with supervision   Standing balance support: During functional activity, Reliant on assistive device for balance, Single extremity supported Standing balance-Leahy Scale: Poor Standing balance comment: pt dependent on external assist or AD to maintain balance during dynamic activity                            Communication Communication Communication: No  apparent difficulties  Cognition Arousal: Alert Behavior During Therapy: WFL for tasks assessed/performed   PT - Cognitive impairments: No apparent impairments                         Following commands: Impaired Following commands impaired: Only follows one step  commands consistently, Follows multi-step commands inconsistently, Follows multi-step commands with increased time    Cueing Cueing Techniques: Verbal cues, Gestural cues  Exercises      General Comments General comments (skin integrity, edema, etc.): VSS on RA, spouse present and supportive and daughter arriving at end of session      Pertinent Vitals/Pain Pain Assessment Pain Assessment: Faces Faces Pain Scale: Hurts little more Pain Location: R shoulder and R-sided flank Pain Descriptors / Indicators: Grimacing, Guarding, Discomfort, Aching Pain Intervention(s): Monitored during session, Limited activity within patient's tolerance    Home Living                          Prior Function            PT Goals (current goals can now be found in the care plan section) Acute Rehab PT Goals Patient Stated Goal: Pain to be controlled so I can tolerate activity PT Goal Formulation: With patient Time For Goal Achievement: 02/24/24 Progress towards PT goals: Progressing toward goals    Frequency    Min 2X/week      PT Plan      Co-evaluation              AM-PAC PT 6 Clicks Mobility   Outcome Measure  Help needed turning from your back to your side while in a flat bed without using bedrails?: Total Help needed moving from lying on your back to sitting on the side of a flat bed without using bedrails?: Total Help needed moving to and from a bed to a chair (including a wheelchair)?: A Little Help needed standing up from a chair using your arms (e.g., wheelchair or bedside chair)?: A Little Help needed to walk in hospital room?: A Little Help needed climbing 3-5 steps with a railing? : A Lot 6 Click Score: 13    End of Session   Activity Tolerance: Patient tolerated treatment well;Patient limited by pain Patient left: in chair;with call bell/phone within reach;with family/visitor present Nurse Communication: Mobility status PT Visit Diagnosis: Other  abnormalities of gait and mobility (R26.89);History of falling (Z91.81);Pain Pain - Right/Left: Right Pain - part of body: Shoulder;Arm     Time: 1030-1055 PT Time Calculation (min) (ACUTE ONLY): 25 min  Charges:    $Gait Training: 23-37 mins PT General Charges $$ ACUTE PT VISIT: 1 Visit                     Quinten Allerton R. PTA Acute Rehabilitation Services Office: [phone]   Therisa CHRISTELLA Boor 02/20/2024, 11:13 AM

## 2024-02-20 NOTE — Plan of Care (Signed)

## 2024-02-20 NOTE — TOC Progression Note (Addendum)
 Transition of Care [facility]) - Progression Note    Patient Details  Name: Ruby Logiudice MRN: [id number] Date of Birth: [birth year]  [facility] CM/SW Contact  Carmelita FORBES Carbon, LCSW Phone Number: 02/20/2024, 9:26 AM  Clinical Narrative:    CSW called [place] at [facility]. She states they will have a bed for patient tomorrow 9/2. Not able to accept today. Will need DC summary by 10 am tomorrow - MD and [facility] handoff updated.    Expected Discharge Plan: Home w Home Health Services Barriers to Discharge: Continued Medical Work up               Expected Discharge Plan and Services   Discharge Planning Services: CM Consult Post Acute Care Choice: Home Health Living arrangements for the past 2 months: Single Family Home                 DME Arranged: N/A         HH Arranged: PT, OT HH Agency: [name] Date [facility] Agency Contacted: 02/10/24 Time HH Agency Contacted: 1507 Representative spoke with at [facility] Agency: [name]   Social Drivers of Health (SDOH) Interventions SDOH Screenings   Food Insecurity: No Food Insecurity (02/10/2024)  Housing: Low Risk  (02/11/2024)  Transportation Needs: No Transportation Needs (02/11/2024)  Utilities: Not At Risk (02/10/2024)  Social Connections: Socially Integrated (02/10/2024)  Tobacco Use: Low Risk  (02/10/2024)    Readmission Risk Interventions     No data to display

## 2024-02-20 NOTE — Progress Notes (Signed)
 Mobility Specialist Progress Note:   02/20/24 1510  Mobility  Activity Ambulated with assistance (In hallway)  Level of Assistance Minimal assist, patient does 75% or more  Assistive Device [employer] Ambulated (ft) 80 ft  RUE Weight Bearing Per Provider Order NWB  Activity Response Tolerated well  Mobility Referral Yes  Mobility visit 1 Mobility  Mobility Specialist Start Time (ACUTE ONLY) 1341  Mobility Specialist Stop Time (ACUTE ONLY) 1356  Mobility Specialist Time Calculation (min) (ACUTE ONLY) 15 min   Received pt in chair and agreeable to mobility. Required MinA STS, otherwise contact guard. No c/o. Returned to room without fault. Left in chair with personal belongings and call light within reach. All needs met.  Lavanda Pollack Mobility Specialist  Please contact via [employer] or  Rehab Office [phone]

## 2024-02-20 NOTE — Discharge Summary (Signed)
 Physician Discharge Summary  Cheridan Kibler FMW:[id number] DOB: 27-Mar-1956 DOA: 02/10/2024  PCP: Valma Carwin, MD  Admit date: 02/10/2024 Discharge date: 02/21/2024  Admitted From: Home Disposition: SNF  Recommendations for Outpatient Follow-up:  Follow up with PCP in 1-2 weeks Please obtain BMP/CBC in one week your next doctors visit.  Follow-up outpatient orthopedic in 2 weeks Pain medication with bowel regimen Monitor for any signs of urinary retention   Discharge Condition: Stable CODE STATUS: Full code Diet recommendation: Diabetic  Brief/Interim Summary: Brief Narrative:   68 y.o. female with medical history significant of anxiety, depression, depression, hyperlipidemia, hypertension, type 2 diabetes, obesity who had a mechanical fall at home going down the stairs.   CT showed right 1st rib fracture, right 3rd-6th rib fracture, nondisplaced fracture posterior r 2nd rib, and R clavicular fracture.   Admitted to hospitalist service with trauma surgery following.  Orthopedics recommended NWB to RUE, sling immobilization.  Trauma has now signed off.  Ongoing pain control.  PT recommending SNF.   Assessment & Plan:  Principal Problem:   Fracture of multiple ribs Active Problems:   Depression   Anxiety   Hyperlipidemia   Hypertension   Obesity (BMI 35.0-39.9 without comorbidity)   Clavicle fracture   Fall   Type 2 diabetes mellitus with hyperglycemia (HCC)   Grade I diastolic dysfunction   Rib fractures     Right Mid Clavicle Fracture Right 1st Rib Fracture  Right 3rd-6th Rib Fracture.  Nondisplaced Fracture Posterior R 2nd Rib Mechanical Fall -Appreciate ortho - Non op management of clavicle fracture - NWB to RUE, sling immobilization, ok for wrist and elbow motion as tolerated.  Ok to start gentle pendulum exercises of the shoulder.   Follow up with Dr. Kendal 2 weeks after discharge for repeat x rays. Appreciate surgery - CTA without evdience of acute arterial  injury and significant stenosis in neck, known clavicular and first rib fx - pain management, IS, pulm toilet.  Trauma now signed off - follow with Dr. Kendal in 2 weeks and with PCP in 1-2 weeks for post hospital follow up.  -Will continue adjust her pain regimen as appropriate.  Bowel regimen.   R apical PTX -Noted on CT Repeat x ray without PTX noted    AKI -Resolved.   Orthostatic Hypotension C/o LH/nausea with standing on 8/23 - Resolved   Physical Deconditioning -Therapy evaluation  Hypertension -Ramipril     HFpEF, EF 65%.  Mild AS -Will resume home meds upon discharge   Depression  Anxiety -Wellbutrin , cymbalta , ativan  prn, ambien  nightly   T2DM  Resume home regimen upon discharge   GERD -PPI   Urinary Retention - Vallas catheter removed 8/27.  Repeat bladder scan as necessary   Obesity Body mass index is 34.15 kg/m.       DVT prophylaxis: lovenox  30 mg BID per trauma guidelines Code Status: full Family Communication: family at bedside Disposition:    SNF placement   PT Follow up Recs: Skilled Nursing-Short Term Rehab (<3 Hours/Day)02/14/2024 1500  Subjective: No complaints feeling okay  Examination:  General exam: Appears calm and comfortable  Respiratory system: Clear to auscultation. Respiratory effort normal. Cardiovascular system: S1 & S2 heard, RRR. No JVD, murmurs, rubs, gallops or clicks. No pedal edema. Gastrointestinal system: Abdomen is nondistended, soft and nontender. No organomegaly or masses felt. Normal bowel sounds heard. Central nervous system: Alert and oriented. No focal neurological deficits. Extremities: Symmetric 5 x 5 power. Skin: No rashes, lesions or ulcers Psychiatry: Judgement and insight  appear normal. Mood & affect appropriate.    Discharge Diagnoses:  Principal Problem:   Fracture of multiple ribs Active Problems:   Depression   Anxiety   Hyperlipidemia   Hypertension   Obesity (BMI 35.0-39.9 without  comorbidity)   Clavicle fracture   Fall   Type 2 diabetes mellitus with hyperglycemia (HCC)   Grade I diastolic dysfunction   Rib fractures      Discharge Exam: Vitals:   02/20/24 1606 02/21/24 0517  BP: 134/62 130/71  Pulse: 80 75  Resp: 18 18  Temp:  (!) 97.5 F (36.4 C)  SpO2: 94% 97%   Vitals:   02/20/24 0500 02/20/24 0757 02/20/24 1606 02/21/24 0517  BP:  (!) 145/68 134/62 130/71  Pulse:  75 80 75  Resp:   18 18  Temp:  (!) 97.4 F (36.3 C)  (!) 97.5 F (36.4 C)  TempSrc:    Oral  SpO2:  96% 94% 97%  Weight: 114.1 kg   109.9 kg  Height:          Discharge Instructions   Allergies as of 02/21/2024       Reactions   Statins Other (See Comments)   Myalgia    Tetracyclines & Related Photosensitivity        Medication List     STOP taking these medications    citalopram  10 MG tablet Commonly known as: CELEXA    rosuvastatin  10 MG tablet Commonly known as: CRESTOR    Vitamin D (Ergocalciferol) 1.25 MG (50000 UNIT) Caps capsule Commonly known as: DRISDOL       TAKE these medications    acetaminophen  500 MG tablet Commonly known as: TYLENOL  Take 2 tablets (1,000 mg total) by mouth every 6 (six) hours as needed.   buPROPion  300 MG 24 hr tablet Commonly known as: WELLBUTRIN  XL Take 450 mg by mouth daily.   DULoxetine  30 MG capsule Commonly known as: CYMBALTA  Take 30 mg by mouth daily.   gabapentin  100 MG capsule Commonly known as: NEURONTIN  Take 1 capsule (100 mg total) by mouth 3 (three) times daily.   hydroxypropyl methylcellulose / hypromellose 2.5 % ophthalmic solution Commonly known as: ISOPTO TEARS / GONIOVISC Place 1 drop into both eyes daily as needed for dry eyes.   lidocaine  5 % Commonly known as: LIDODERM  Place 2 patches onto the skin daily for 7 days. Remove & Discard patch within 12 hours or as directed by MD   LORazepam  1 MG tablet Commonly known as: ATIVAN  Take 1 tablet (1 mg total) by mouth 2 (two) times daily as  needed for anxiety, seizure, sedation or sleep.   methocarbamol  500 MG tablet Commonly known as: ROBAXIN  Take 2 tablets (1,000 mg total) by mouth every 8 (eight) hours as needed for muscle spasms.   omeprazole 20 MG capsule Commonly known as: PRILOSEC Take 20 mg by mouth daily.   Oxycodone  HCl 10 MG Tabs Take 1 tablet (10 mg total) by mouth every 6 (six) hours as needed.   oxyCODONE  10 mg 12 hr tablet Commonly known as: OXYCONTIN  Take 1 tablet (10 mg total) by mouth every 12 (twelve) hours.   pioglitazone  30 MG tablet Commonly known as: ACTOS  Take 30 mg by mouth daily.   polyethylene glycol 17 g packet Commonly known as: MIRALAX  / GLYCOLAX  Take 17 g by mouth 2 (two) times daily.   ramipril  10 MG capsule Commonly known as: ALTACE  Take 10 mg by mouth daily.   zolpidem  10 MG tablet Commonly known as:  AMBIEN  Take 10 mg by mouth at bedtime.               Durable Medical Equipment  (From admission, onward)           Start     Ordered   02/13/24 1043  For home use only DME Hospital bed  Once       Question Answer Comment  Length of Need Lifetime   Patient has (list medical condition): bed mobility two person assist   Head must be elevated greater than: 45 degrees   Bed type Semi-electric      02/13/24 1045            Contact information for follow-up providers     [facility], [facility] Follow up.   Specialty: Home Health Services Contact information: [address] [place] [address] [phone]         [facility]. Call.   Why: As needed Contact information: [address] [place]  [address] [phone]        Kendal Franky SQUIBB, MD. Schedule an appointment as soon as possible for a visit in 2 week(s).   Specialty: Orthopedic Surgery Why: for follow up clavicle (collar bone) fracture Contact information: [address] [place] [address] [phone]               Contact information for after-discharge care     Destination     [facility] .   Service: Skilled Nursing Contact information: [address] [place]  [address] [phone]                    Allergies  Allergen Reactions   Statins Other (See Comments)    Myalgia    Tetracyclines & Related Photosensitivity    You were cared for by a hospitalist during your hospital stay. If you have any questions about your discharge medications or the care you received while you were in the hospital after you are discharged, you can call the unit and asked to speak with the hospitalist on call if the hospitalist that took care of you is not available. Once you are discharged, your primary care physician will handle any further medical issues. Please note that no refills for any discharge medications will be authorized once you are discharged, as it is imperative that you return to your primary care physician (or establish a relationship with a primary care physician if you do not have one) for your aftercare needs so that they can reassess your need for medications and monitor your lab values.  You were cared for by a hospitalist during your hospital stay. If you have any questions about your discharge medications or the care you received while you were in the hospital after you are discharged, you can call the unit and asked to speak with the hospitalist on call if the hospitalist that took care of you is not available. Once you are discharged, your primary care physician will handle any further medical issues. Please note that NO REFILLS for any discharge medications will be authorized once you are discharged, as it is imperative that you return to your primary care physician (or establish a relationship with a primary care physician if you do not have one) for your aftercare needs so that they can reassess your need for medications and monitor your lab values.  Please  request your Prim.MD to go over all Hospital Tests  and Procedure/Radiological results at the follow up, please get all Hospital records sent to your Prim MD by signing hospital release before you go home.  Get CBC, CMP, 2 view Chest X ray checked  by Primary MD during your next visit or SNF MD in 5-7 days ( we routinely change or add medications that can affect your baseline labs and fluid status, therefore we recommend that you get the mentioned basic workup next visit with your PCP, your PCP may decide not to get them or add new tests based on their clinical decision)  On your next visit with your primary care physician please Get Medicines reviewed and adjusted.  If you experience worsening of your admission symptoms, develop shortness of breath, life threatening emergency, suicidal or homicidal thoughts you must seek medical attention immediately by calling 911 or calling your MD immediately  if symptoms less severe.  You Must read complete instructions/literature along with all the possible adverse reactions/side effects for all the Medicines you take and that have been prescribed to you. Take any new Medicines after you have completely understood and accpet all the possible adverse reactions/side effects.   Do not drive, operate heavy machinery, perform activities at heights, swimming or participation in water activities or provide baby sitting services if your were admitted for syncope or siezures until you have seen by Primary MD or a Neurologist and advised to do so again.  Do not drive when taking Pain medications.   Procedures/Studies: DG CHEST PORT 1 VIEW Result Date: 02/13/2024 CLINICAL DATA:  Pneumothorax EXAM: PORTABLE CHEST 1 VIEW COMPARISON:  02/10/2024 FINDINGS: Multiple right rib fractures and mid right clavicle fracture again noted. No visible pneumothorax. Linear left base atelectasis. Otherwise no confluent airspace opacities or effusions. IMPRESSION: Multiple right rib  fractures and mid right clavicle fracture. No pneumothorax. Left base atelectasis. Electronically Signed   By: Franky Crease M.D.   On: 02/13/2024 12:30   CT ANGIO NECK W OR WO CONTRAST Addendum Date: 02/13/2024 ADDENDUM REPORT: 02/13/2024 00:12 ADDENDUM: Findings discussed with NP J. Blondie via telephone at 12:03 a.m. Electronically Signed   By: Gilmore GORMAN Molt M.D.   On: 02/13/2024 00:12   Result Date: 02/13/2024 CLINICAL DATA:  R first rib fracture EXAM: CT ANGIOGRAPHY NECK TECHNIQUE: Multidetector CT imaging of the neck was performed using the standard protocol during bolus administration of intravenous contrast. Multiplanar CT image reconstructions and MIPs were obtained to evaluate the vascular anatomy. Carotid stenosis measurements (when applicable) are obtained utilizing NASCET criteria, using the distal internal carotid diameter as the denominator. RADIATION DOSE REDUCTION: This exam was performed according to the departmental dose-optimization program which includes automated exposure control, adjustment of the mA and/or kV according to patient size and/or use of iterative reconstruction technique. CONTRAST:  75mL OMNIPAQUE  IOHEXOL  350 MG/ML SOLN COMPARISON:  CT cervical spine and CT of the chest/abdomen/pelvis on 02/10/2024. FINDINGS: Mildly motion limited evaluation in the lower neck. This limitation: Aortic arch: Great vessel origins are patent without significant stenosis or evidence of dissection. Right carotid system: No evidence of dissection, stenosis (50% or greater) or occlusion. Left carotid system: No evidence of dissection, stenosis (50% or greater) or occlusion. Vertebral arteries: Codominant. No evidence of dissection, stenosis (50% or greater) or occlusion. Skeleton: Known right clavicular fracture and right first rib fractures, also characterized on recent CT of the chest/abdomen/pelvis. Please see recent CT of the cervical spine for evaluation of the cervical spine. Other neck: No  evidence of acute abnormality on limited assessment.  Upper chest: Small right apical pneumothorax. Other: Attempting to contact the on call provider at the time of dictation for call report. Will addend when reached. IMPRESSION: 1. Interval development of a small right apical pneumothorax. 2. No evidence of acute arterial injury or significant stenosis in the neck. 3. Known right clavicular and right first rib fractures, as characterized on recent CT of the chest/abdomen/pelvis. Electronically Signed: By: Gilmore GORMAN Molt M.D. On: 02/12/2024 23:29   CT T-SPINE NO CHARGE Result Date: 02/10/2024 CLINICAL DATA:  Clemens down a flight of stairs. Multiple known rib fractures. Back pain. EXAM: CT THORACIC AND LUMBAR SPINE WITHOUT CONTRAST TECHNIQUE: Multidetector CT imaging of the thoracic and lumbar spine was performed without intravenous contrast. Multiplanar CT image reconstructions were also generated. RADIATION DOSE REDUCTION: This exam was performed according to the departmental dose-optimization program which includes automated exposure control, adjustment of the mA and/or kV according to patient size and/or use of iterative reconstruction technique. COMPARISON:  PA Lat chest from today and 02/07/2012. CT abdomen pelvis and reconstructions 08/22/2018. FINDINGS: CT THORACIC SPINE FINDINGS Segmentation: There are 12 rib-bearing thoracic type vertebrae. Alignment: There is mild dextroscoliosis but no AP listhesis. Vertebrae: Osteopenia. No thoracic spine fractures or focal pathologic process identified. There is thoracic spondylosis and multilevel endplate Schmorl's nodes beginning at T5, but also seen at T2-3 T3-4. Paraspinal and other soft tissues: No paraspinal mass, hematoma or fluid collections. Other soft tissue structures reported separately. Moderate hiatal hernia. Cardiomegaly with CAD. No pneumothorax. Disc levels: The thoracic discs are diffusely degenerated. The spinal canal is not optimally seen due to  beam hardening related to body habitus. There is no obvious significant soft tissue or bony encroachment on the thecal sac such as due to herniated discs or spinal canal hematoma. The spinal canal appears widely patent as well as can be seen. There are slight facet spurs at lower thoracic levels but no levels demonstrate significant foraminal stenosis. Other: Midshaft right clavicle acute fracture with overriding and mild stranding. Nondisplaced fracture posteromedial right first rib, anterior right second rib, with displaced fractures of the posterior right third through fifth ribs, posterolateral right sixth rib. CT LUMBAR SPINE FINDINGS Segmentation: Standard. Alignment: Trace chronic discogenic L2-3 retrolisthesis. Slight dextroscoliosis. Otherwise normal alignment. Vertebrae: Osteopenia without evidence of fractures. There is mild chronic anterior wedging of the L1 vertebral body, but no more than previously. There is moderate spondylosis, greatest at L1-2, L2-3, L5-S1. Paraspinal and other soft tissues: Reported separately. No acute findings. Disc levels: The discs are chronically collapsed, with vacuum phenomenon and endplate irregularities, at all lumbar levels except for a T12-L1 and L4-5, both of which have relatively normal disc heights. At L3-4, a circumferential disc osteophyte complex and dorsal ligamentous hypertrophy cause moderate spinal canal stenosis. There is mild spinal stenosis at L4-5 due to a disc bulge and slightly thickened ligaments. Moderate spinal stenosis is seen again at L5-S1 due to bidirectional osteophytes and a calcified central disc extrusion. There is relatively mild facet joint hypertrophy. Along with the spondylosis this is associated with foraminal stenosis which is mild-to-moderate on the left at L2-3, bilaterally mild at L3-4, mild-to-moderate on the left at L4-5, and bilaterally moderate to severe at L5-S1. Both SI joints are patent with spurring and vacuum phenomenon.  IMPRESSION: 1. Osteopenia and degenerative change without evidence of acute fractures of the thoracic or lumbar spine. 2. Multiple right rib fractures and right clavicle fracture with override. 3. Cardiomegaly with CAD. 4. Moderate hiatal hernia. 5. Chronic mild anterior  wedging of the L1 vertebral body. 6. Multilevel degenerative disc and joint changes with spinal stenosis at L3-4, L4-5 and L5-S1. 7. Foraminal stenosis greatest at L5-S1. Electronically Signed   By: Francis Quam M.D.   On: 02/10/2024 06:20   CT L-SPINE NO CHARGE Result Date: 02/10/2024 CLINICAL DATA:  Clemens down a flight of stairs. Multiple known rib fractures. Back pain. EXAM: CT THORACIC AND LUMBAR SPINE WITHOUT CONTRAST TECHNIQUE: Multidetector CT imaging of the thoracic and lumbar spine was performed without intravenous contrast. Multiplanar CT image reconstructions were also generated. RADIATION DOSE REDUCTION: This exam was performed according to the departmental dose-optimization program which includes automated exposure control, adjustment of the mA and/or kV according to patient size and/or use of iterative reconstruction technique. COMPARISON:  PA Lat chest from today and 02/07/2012. CT abdomen pelvis and reconstructions 08/22/2018. FINDINGS: CT THORACIC SPINE FINDINGS Segmentation: There are 12 rib-bearing thoracic type vertebrae. Alignment: There is mild dextroscoliosis but no AP listhesis. Vertebrae: Osteopenia. No thoracic spine fractures or focal pathologic process identified. There is thoracic spondylosis and multilevel endplate Schmorl's nodes beginning at T5, but also seen at T2-3 T3-4. Paraspinal and other soft tissues: No paraspinal mass, hematoma or fluid collections. Other soft tissue structures reported separately. Moderate hiatal hernia. Cardiomegaly with CAD. No pneumothorax. Disc levels: The thoracic discs are diffusely degenerated. The spinal canal is not optimally seen due to beam hardening related to body habitus.  There is no obvious significant soft tissue or bony encroachment on the thecal sac such as due to herniated discs or spinal canal hematoma. The spinal canal appears widely patent as well as can be seen. There are slight facet spurs at lower thoracic levels but no levels demonstrate significant foraminal stenosis. Other: Midshaft right clavicle acute fracture with overriding and mild stranding. Nondisplaced fracture posteromedial right first rib, anterior right second rib, with displaced fractures of the posterior right third through fifth ribs, posterolateral right sixth rib. CT LUMBAR SPINE FINDINGS Segmentation: Standard. Alignment: Trace chronic discogenic L2-3 retrolisthesis. Slight dextroscoliosis. Otherwise normal alignment. Vertebrae: Osteopenia without evidence of fractures. There is mild chronic anterior wedging of the L1 vertebral body, but no more than previously. There is moderate spondylosis, greatest at L1-2, L2-3, L5-S1. Paraspinal and other soft tissues: Reported separately. No acute findings. Disc levels: The discs are chronically collapsed, with vacuum phenomenon and endplate irregularities, at all lumbar levels except for a T12-L1 and L4-5, both of which have relatively normal disc heights. At L3-4, a circumferential disc osteophyte complex and dorsal ligamentous hypertrophy cause moderate spinal canal stenosis. There is mild spinal stenosis at L4-5 due to a disc bulge and slightly thickened ligaments. Moderate spinal stenosis is seen again at L5-S1 due to bidirectional osteophytes and a calcified central disc extrusion. There is relatively mild facet joint hypertrophy. Along with the spondylosis this is associated with foraminal stenosis which is mild-to-moderate on the left at L2-3, bilaterally mild at L3-4, mild-to-moderate on the left at L4-5, and bilaterally moderate to severe at L5-S1. Both SI joints are patent with spurring and vacuum phenomenon. IMPRESSION: 1. Osteopenia and degenerative  change without evidence of acute fractures of the thoracic or lumbar spine. 2. Multiple right rib fractures and right clavicle fracture with override. 3. Cardiomegaly with CAD. 4. Moderate hiatal hernia. 5. Chronic mild anterior wedging of the L1 vertebral body. 6. Multilevel degenerative disc and joint changes with spinal stenosis at L3-4, L4-5 and L5-S1. 7. Foraminal stenosis greatest at L5-S1. Electronically Signed   By: Francis Quam  M.D.   On: 02/10/2024 06:20   CT CHEST ABDOMEN PELVIS W CONTRAST Result Date: 02/10/2024 CLINICAL DATA:  Patient fell down a flight of stairs. Pain in multiple areas. Right rib fractures on chest x-ray. EXAM: CT CHEST, ABDOMEN, AND PELVIS WITH CONTRAST TECHNIQUE: Multidetector CT imaging of the chest, abdomen and pelvis was performed following the standard protocol during bolus administration of intravenous contrast. RADIATION DOSE REDUCTION: This exam was performed according to the departmental dose-optimization program which includes automated exposure control, adjustment of the mA and/or kV according to patient size and/or use of iterative reconstruction technique. CONTRAST:  OMNIPAQUE  IOHEXOL  300 MG/ML  SOLN COMPARISON:  CT stone study 03/29/2018 FINDINGS: CT CHEST FINDINGS Cardiovascular: Heart size upper normal. No substantial pericardial effusion. Mild atherosclerotic calcification is noted in the wall of the thoracic aorta. Mediastinum/Nodes: No mediastinal edema or hemorrhage. No mediastinal lymphadenopathy. There is no hilar lymphadenopathy. Small hiatal hernia. The esophagus has normal imaging features. There is no axillary lymphadenopathy. Lungs/Pleura: No evidence for pneumothorax. No substantial pleural effusion. Dependent atelectasis noted in both lower lungs. No focal airspace consolidation. No suspicious pulmonary nodule or mass. Musculoskeletal: Acute fracture noted mid right clavicle. Acute fractures identified in the posterior right first rib. Suspect  nondisplaced fracture posterior right second rib. Acute fractures identified posterior right third through sixth ribs. No evidence for left-sided rib fracture. No sternal fracture. No obvious thoracic spine fracture but see thoracic spine CT report for definitive thoracic spine characterization, dictated separately. CT ABDOMEN PELVIS FINDINGS Hepatobiliary: No suspicious focal abnormality within the liver parenchyma. Gallbladder is surgically absent. No intrahepatic or extrahepatic biliary dilation. Pancreas: No focal mass lesion. No dilatation of the main duct. No intraparenchymal cyst. No peripancreatic edema. Spleen: No splenomegaly. No suspicious focal mass lesion. Adrenals/Urinary Tract: No adrenal nodule or mass. Central sinus cysts are noted in the kidneys bilaterally. No followup imaging is recommended. Kidneys otherwise unremarkable. No evidence for hydroureter. The urinary bladder appears normal for the degree of distention. Stomach/Bowel: Small hiatal hernia. Stomach otherwise unremarkable. Duodenum is normally positioned as is the ligament of Treitz. No small bowel wall thickening. No small bowel dilatation. The terminal ileum is normal. The appendix is normal. No gross colonic mass. No colonic wall thickening. Vascular/Lymphatic: There is mild atherosclerotic calcification of the abdominal aorta without aneurysm. There is no gastrohepatic or hepatoduodenal ligament lymphadenopathy. No retroperitoneal or mesenteric lymphadenopathy. No pelvic sidewall lymphadenopathy. Reproductive: The uterus is unremarkable.  There is no adnexal mass. Other: No intraperitoneal free fluid. Musculoskeletal: No evidence for lumbar spine fracture, but please see report for dedicated lumbar spine CT dictated separately. No evidence for an acute fracture in the bony anatomy of the pelvis. Small umbilical hernia contains only fat. IMPRESSION: 1. Acute fracture mid right clavicle. 2. Acute fractures posterior right first and  third through sixth ribs. Suspect nondisplaced fracture posterior right second rib. No evidence for pneumothorax or pleural effusion. 3. No evidence for acute traumatic injury in the abdomen or pelvis. No intraperitoneal free fluid. 4. Small hiatal hernia. 5.  Aortic Atherosclerosis (ICD10-I70.0). Electronically Signed   By: Camellia Candle M.D.   On: 02/10/2024 06:00   CT Head Wo Contrast Result Date: 02/10/2024 CLINICAL DATA:  Head trauma, fell down a flight of steps. EXAM: CT HEAD WITHOUT CONTRAST CT CERVICAL SPINE WITHOUT CONTRAST TECHNIQUE: Multidetector CT imaging of the head and cervical spine was performed following the standard protocol without intravenous contrast. Multiplanar CT image reconstructions of the cervical spine were also generated. RADIATION DOSE  REDUCTION: This exam was performed according to the departmental dose-optimization program which includes automated exposure control, adjustment of the mA and/or kV according to patient size and/or use of iterative reconstruction technique. COMPARISON:  None Available. FINDINGS: CT HEAD FINDINGS Brain: No intracranial hemorrhage, mass effect, or evidence of acute infarct. No hydrocephalus. No extra-axial fluid collection. Vascular: No hyperdense vessel or unexpected calcification. Skull: No fracture or focal lesion.  Right posterior scalp hematoma. Sinuses/Orbits: No acute finding. Paranasal sinuses and mastoid air cells are well aerated. Other: None. CT CERVICAL SPINE FINDINGS Alignment: No evidence of traumatic malalignment. Skull base and vertebrae: No acute fracture. No primary bone lesion or focal pathologic process. Soft tissues and spinal canal: No prevertebral fluid or swelling. No visible canal hematoma. Disc levels: Multilevel spondylosis with osteophytic spurring, disc space height loss, and degenerative endplate changes greatest at C4 through C7 where it is moderate to advanced. Multilevel posterior disc osteophyte complexes cause mild  effacement of the ventral thecal sac. No severe spinal canal narrowing. Upper chest: Acute mildly displaced fracture of the posterior right first rib. Partially visualized mildly displaced fracture of the right midclavicle. Other: None. IMPRESSION: 1. No acute intracranial abnormality. 2. Right posterior scalp hematoma. No underlying calvarial fracture. 3. No acute fracture in the cervical spine. 4. Acute mildly displaced fracture of the posterior right first rib. 5. Partially visualized mildly displaced fracture of the right mid clavicle. Electronically Signed   By: Norman Gatlin M.D.   On: 02/10/2024 03:34   CT Cervical Spine Wo Contrast Result Date: 02/10/2024 CLINICAL DATA:  Head trauma, fell down a flight of steps. EXAM: CT HEAD WITHOUT CONTRAST CT CERVICAL SPINE WITHOUT CONTRAST TECHNIQUE: Multidetector CT imaging of the head and cervical spine was performed following the standard protocol without intravenous contrast. Multiplanar CT image reconstructions of the cervical spine were also generated. RADIATION DOSE REDUCTION: This exam was performed according to the departmental dose-optimization program which includes automated exposure control, adjustment of the mA and/or kV according to patient size and/or use of iterative reconstruction technique. COMPARISON:  None Available. FINDINGS: CT HEAD FINDINGS Brain: No intracranial hemorrhage, mass effect, or evidence of acute infarct. No hydrocephalus. No extra-axial fluid collection. Vascular: No hyperdense vessel or unexpected calcification. Skull: No fracture or focal lesion.  Right posterior scalp hematoma. Sinuses/Orbits: No acute finding. Paranasal sinuses and mastoid air cells are well aerated. Other: None. CT CERVICAL SPINE FINDINGS Alignment: No evidence of traumatic malalignment. Skull base and vertebrae: No acute fracture. No primary bone lesion or focal pathologic process. Soft tissues and spinal canal: No prevertebral fluid or swelling. No visible  canal hematoma. Disc levels: Multilevel spondylosis with osteophytic spurring, disc space height loss, and degenerative endplate changes greatest at C4 through C7 where it is moderate to advanced. Multilevel posterior disc osteophyte complexes cause mild effacement of the ventral thecal sac. No severe spinal canal narrowing. Upper chest: Acute mildly displaced fracture of the posterior right first rib. Partially visualized mildly displaced fracture of the right midclavicle. Other: None. IMPRESSION: 1. No acute intracranial abnormality. 2. Right posterior scalp hematoma. No underlying calvarial fracture. 3. No acute fracture in the cervical spine. 4. Acute mildly displaced fracture of the posterior right first rib. 5. Partially visualized mildly displaced fracture of the right mid clavicle. Electronically Signed   By: Norman Gatlin M.D.   On: 02/10/2024 03:34   DG Shoulder Right Addendum Date: 02/10/2024 ADDENDUM REPORT: 02/10/2024 03:18 ADDENDUM: After comparing with today's chest x-ray, multiple right rib fractures are noted  involving the 2nd through 5th ribs. Electronically Signed   By: Franky Crease M.D.   On: 02/10/2024 03:18   Result Date: 02/10/2024 CLINICAL DATA:  Fall, posterior shoulder pain EXAM: RIGHT SHOULDER - 2+ VIEW COMPARISON:  None Available. FINDINGS: There is no evidence of fracture or dislocation. There is no evidence of arthropathy or other focal bone abnormality. Soft tissues are unremarkable. IMPRESSION: Negative. Electronically Signed: By: Franky Crease M.D. On: 02/10/2024 03:15   DG Chest 2 View Result Date: 02/10/2024 CLINICAL DATA:  Fall, right shoulder pain, chest pain EXAM: CHEST - 2 VIEW COMPARISON:  02/07/2012 FINDINGS: Mild elevation of the right hemidiaphragm. Heart is borderline in size. Mild vascular congestion. No confluent opacities, effusions or edema. Multiple right rib fractures involving the 2nd through 5th ribs. No pneumothorax. IMPRESSION: Right 2nd through 5th rib  fractures. No visible effusion or pneumothorax. Vascular congestion. Electronically Signed   By: Franky Crease M.D.   On: 02/10/2024 03:17     The results of significant diagnostics from this hospitalization (including imaging, microbiology, ancillary and laboratory) are listed below for reference.     Microbiology: No results found for this or any previous visit (from the past 240 hours).   Labs: BNP (last 3 results) No results for input(s): BNP in the last 8760 hours. Basic Metabolic Panel: Recent Labs  Lab 02/16/24 0447 02/17/24 0254 02/18/24 0804 02/19/24 0523 02/20/24 0727 02/21/24 0622  NA 134* 136 137 138 142 136  K 4.2 4.0 3.8 3.8 4.1 4.2  CL 96* 99 101 101 102 98  CO2 27 30 27 28 28 26   GLUCOSE 116* 138* 148* 119* 123* 139*  BUN 16 12 10 11 13 13   CREATININE 0.61 0.59 0.67 0.63 0.62 0.68  CALCIUM  8.2* 8.2* 8.1* 8.1* 8.4* 8.3*  MG 2.0 2.0 1.9 2.1 2.2 2.2  PHOS 2.6  --   --   --   --   --    Liver Function Tests: No results for input(s): AST, ALT, ALKPHOS, BILITOT, PROT, ALBUMIN in the last 168 hours. No results for input(s): LIPASE, AMYLASE in the last 168 hours. No results for input(s): AMMONIA in the last 168 hours. CBC: Recent Labs  Lab 02/17/24 0254 02/18/24 0804 02/19/24 0523 02/20/24 0727 02/21/24 0622  WBC 6.6 6.7 6.6 6.5 9.8  HGB 10.2* 10.9* 10.4* 10.6* 10.7*  HCT 32.6* 35.7* 33.7* 34.0* 35.0*  MCV 83.2 85.2 84.5 84.2 85.2  PLT 263 258 256 253 257   Cardiac Enzymes: No results for input(s): CKTOTAL, CKMB, CKMBINDEX, TROPONINI in the last 168 hours. BNP: Invalid input(s): POCBNP CBG: Recent Labs  Lab 02/20/24 0759 02/20/24 1151 02/20/24 1607 02/20/24 2148 02/21/24 0717  GLUCAP 138* 210* 125* 184* 139*   D-Dimer No results for input(s): DDIMER in the last 72 hours. Hgb A1c No results for input(s): HGBA1C in the last 72 hours. Lipid Profile No results for input(s): CHOL, HDL, LDLCALC, TRIG,  CHOLHDL, LDLDIRECT in the last 72 hours. Thyroid function studies No results for input(s): TSH, T4TOTAL, T3FREE, THYROIDAB in the last 72 hours.  Invalid input(s): FREET3 Anemia work up No results for input(s): VITAMINB12, FOLATE, FERRITIN, TIBC, IRON, RETICCTPCT in the last 72 hours. Urinalysis    Component Value Date/Time   COLORURINE YELLOW 02/11/2024 0222   APPEARANCEUR HAZY (A) 02/11/2024 0222   LABSPEC >1.046 (H) 02/11/2024 0222   PHURINE 5.0 02/11/2024 0222   GLUCOSEU 50 (A) 02/11/2024 0222   HGBUR NEGATIVE 02/11/2024 0222   BILIRUBINUR NEGATIVE 02/11/2024  0222   KETONESUR NEGATIVE 02/11/2024 0222   PROTEINUR NEGATIVE 02/11/2024 0222   UROBILINOGEN 0.2 08/17/2013 1635   NITRITE NEGATIVE 02/11/2024 0222   LEUKOCYTESUR NEGATIVE 02/11/2024 0222   Sepsis Labs Recent Labs  Lab 02/18/24 0804 02/19/24 0523 02/20/24 0727 02/21/24 0622  WBC 6.7 6.6 6.5 9.8   Microbiology No results found for this or any previous visit (from the past 240 hours).   Time coordinating discharge:  I have spent 35 minutes face to face with the patient and on the ward discussing the patients care, assessment, plan and disposition with other care givers. >50% of the time was devoted counseling the patient about the risks and benefits of treatment/Discharge disposition and coordinating care.   SIGNED:   Burgess JAYSON Dare, MD  [facility] 02/21/2024, 8:23 AM   If 7PM-7AM, please contact night-coverage

## 2024-02-21 LAB — BASIC METABOLIC PANEL WITH GFR
Anion gap: 12 (ref 5–15)
BUN: 13 mg/dL (ref 8–23)
CO2: 26 mmol/L (ref 22–32)
Calcium: 8.3 mg/dL — ABNORMAL LOW (ref 8.9–10.3)
Chloride: 98 mmol/L (ref 98–111)
Creatinine, Ser: 0.68 mg/dL (ref 0.44–1.00)
GFR, Estimated: >60 mL/min (ref >60)
Glucose, Bld: 139 mg/dL — ABNORMAL HIGH (ref 70–99)
Potassium: 4.2 mmol/L (ref 3.5–5.1)
Sodium: 136 mmol/L (ref 135–145)

## 2024-02-21 LAB — CBC
HCT: 35 % — ABNORMAL LOW (ref 36.0–46.0)
Hemoglobin: 10.7 g/dL — ABNORMAL LOW (ref 12.0–15.0)
MCH: 26 pg (ref 26.0–34.0)
MCHC: 30.6 g/dL (ref 30.0–36.0)
MCV: 85.2 fL (ref 80.0–100.0)
Platelets: 257 K/uL (ref 150–400)
RBC: 4.11 MIL/uL (ref 3.87–5.11)
RDW: 17.4 % — ABNORMAL HIGH (ref 11.5–15.5)
WBC: 9.8 K/uL (ref 4.0–10.5)
nRBC: 0 % (ref 0.0–0.2)

## 2024-02-21 LAB — MAGNESIUM: Magnesium: 2.2 mg/dL (ref 1.7–2.4)

## 2024-02-21 LAB — GLUCOSE, CAPILLARY
Glucose-Capillary: 139 mg/dL — ABNORMAL HIGH (ref 70–99)
Glucose-Capillary: 137 mg/dL — ABNORMAL HIGH (ref 70–99)

## 2024-02-21 NOTE — Progress Notes (Signed)
 Pt discharged to facility via [facility] with all belongings, paperwork. And Printed prescriptions

## 2024-02-21 NOTE — TOC Transition Note (Signed)
 Transition of Care [facility]) - Discharge Note   Patient Details  Name: Patricia Delacruz MRN: [id number] Date of Birth: 06-01-1956  [facility] CM/SW Contact:  Jeoffrey LITTIE Maranda ISRAEL Phone Number: 02/21/2024, 10:41 AM   Clinical Narrative:    Patient will DC to: [facility] Anticipated DC date: 02/21/24 Family notified: Yes Transport by: [facility]   Per MD patient ready for DC to [facility]. RN to call report prior to discharge [phone] Room 609B. RN, patient, patient's family, and facility notified of DC. Discharge Summary and FL2 sent to facility. DC packet on chart. Ambulance transport requested for patient.   CSW will sign off for now as social work intervention is no longer needed. Please consult us  again if new needs arise.     Final next level of care: Skilled Nursing Facility Barriers to Discharge: Barriers Resolved   Patient Goals and CMS Choice Patient states their goals for this hospitalization and ongoing recovery are:: to return to home CMS Medicare.gov Compare Post Acute Care list provided to:: Patient Choice offered to / list presented to : Patient      Discharge Placement   Existing PASRR number confirmed : 02/21/24          Patient chooses bed at: [facility] Patient to be transferred to facility by: [facility] Name of family member notified: Debby Patient and family notified of of transfer: 02/21/24  Discharge Plan and Services Additional resources added to the After Visit Summary for     Discharge Planning Services: CM Consult Post Acute Care Choice: Home Health          DME Arranged: N/A         HH Arranged: PT, OT HH Agency: [facility] Date [facility] Agency Contacted: 02/10/24 Time HH Agency Contacted: 1507 Representative spoke with at [facility] Agency: [name]  Social Drivers of Health (SDOH) Interventions SDOH Screenings   Food Insecurity: No Food Insecurity (02/10/2024)  Housing: Low Risk  (02/11/2024)  Transportation Needs: No  Transportation Needs (02/11/2024)  Utilities: Not At Risk (02/10/2024)  Social Connections: Socially Integrated (02/10/2024)  Tobacco Use: Low Risk  (02/10/2024)     Readmission Risk Interventions     No data to display

## 2024-02-21 NOTE — Progress Notes (Signed)
 Report called to [id number] [facility] [phone]. Spoke with nurse Olga who will be taking care of pt in Room 609B when she arrives. Nurse verbalized understanding of report. Pt will be traveling by [facility]

## 2024-02-21 NOTE — Progress Notes (Signed)
 Discharge instructions given to pt. Pt verbalized understanding of all teaching and had no further questions. Home meds picked up from pharmacy and given to pt at discharge

## 2024-02-21 NOTE — Progress Notes (Signed)
 Discharge instructions given to pt and pt daughter. Both verbalized understanding of all teaching and had no further questions. Signed Printed proscriptions placed in folder with discharge paperwork for discharge.

## 2024-02-21 NOTE — Progress Notes (Signed)
 Attempted to call report to [facility] [phone]. No answer. Left voicemail. Will try again soon

## 2024-02-21 NOTE — Plan of Care (Signed)

## 2024-02-21 NOTE — Progress Notes (Signed)
 Discharge summary completed yesterday and updated this morning. No other complaints Seen and examined at bedside.  Daughter also present at bedside No acute events overnight.  Stable for discharge to SNF  Burgess Dare MD [facility]

## 2024-02-23 DIAGNOSIS — E088 Diabetes mellitus due to underlying condition with unspecified complications: Secondary | ICD-10-CM | POA: Diagnosis not present

## 2024-02-23 DIAGNOSIS — R296 Repeated falls: Secondary | ICD-10-CM | POA: Diagnosis not present

## 2024-02-23 DIAGNOSIS — S2249XA Multiple fractures of ribs, unspecified side, initial encounter for closed fracture: Secondary | ICD-10-CM | POA: Diagnosis not present

## 2024-02-23 DIAGNOSIS — M48061 Spinal stenosis, lumbar region without neurogenic claudication: Secondary | ICD-10-CM | POA: Diagnosis not present

## 2024-02-23 DIAGNOSIS — E782 Mixed hyperlipidemia: Secondary | ICD-10-CM | POA: Diagnosis not present

## 2024-02-23 DIAGNOSIS — M47816 Spondylosis without myelopathy or radiculopathy, lumbar region: Secondary | ICD-10-CM | POA: Diagnosis not present

## 2024-02-23 DIAGNOSIS — S42001D Fracture of unspecified part of right clavicle, subsequent encounter for fracture with routine healing: Secondary | ICD-10-CM | POA: Diagnosis not present

## 2024-02-23 DIAGNOSIS — I10 Essential (primary) hypertension: Secondary | ICD-10-CM | POA: Diagnosis not present

## 2024-02-23 DIAGNOSIS — I5032 Chronic diastolic (congestive) heart failure: Secondary | ICD-10-CM | POA: Diagnosis not present

## 2024-02-26 ENCOUNTER — Other Ambulatory Visit: Payer: Self-pay | Admitting: Internal Medicine

## 2024-02-26 MED ORDER — OXYCODONE HCL ER 10 MG PO T12A: 10.0000 mg | EXTENDED_RELEASE_TABLET | Freq: Two times a day (BID) | ORAL | 0 refills | Status: AC

## 2024-02-26 MED ORDER — ZOLPIDEM TARTRATE 10 MG PO TABS: 10.0000 mg | ORAL_TABLET | Freq: Every day | ORAL | 0 refills | Status: AC

## 2024-02-29 DIAGNOSIS — M62830 Muscle spasm of back: Secondary | ICD-10-CM | POA: Diagnosis not present

## 2024-03-07 DIAGNOSIS — M6281 Muscle weakness (generalized): Secondary | ICD-10-CM

## 2024-03-07 DIAGNOSIS — F339 Major depressive disorder, recurrent, unspecified: Secondary | ICD-10-CM | POA: Diagnosis not present

## 2024-03-07 DIAGNOSIS — F411 Generalized anxiety disorder: Secondary | ICD-10-CM | POA: Diagnosis not present

## 2024-03-07 DIAGNOSIS — S42021D Displaced fracture of shaft of right clavicle, subsequent encounter for fracture with routine healing: Secondary | ICD-10-CM

## 2024-03-07 DIAGNOSIS — I110 Hypertensive heart disease with heart failure: Secondary | ICD-10-CM | POA: Diagnosis not present

## 2024-03-07 DIAGNOSIS — E785 Hyperlipidemia, unspecified: Secondary | ICD-10-CM | POA: Diagnosis not present
